# Patient Record
Sex: Female | Born: 1965 | Race: White | Hispanic: No | Marital: Married | Smoking: Never smoker
Health system: Southern US, Community
[De-identification: ages and names within clinical notes are randomized; demographics above are authoritative.]

## PROBLEM LIST (undated history)

## (undated) DIAGNOSIS — I341 Nonrheumatic mitral (valve) prolapse: Secondary | ICD-10-CM

## (undated) DIAGNOSIS — I4589 Other specified conduction disorders: Secondary | ICD-10-CM

## (undated) DIAGNOSIS — K589 Irritable bowel syndrome without diarrhea: Secondary | ICD-10-CM

## (undated) DIAGNOSIS — R109 Unspecified abdominal pain: Secondary | ICD-10-CM

## (undated) DIAGNOSIS — R002 Palpitations: Secondary | ICD-10-CM

## (undated) DIAGNOSIS — I10 Essential (primary) hypertension: Secondary | ICD-10-CM

## (undated) DIAGNOSIS — F419 Anxiety disorder, unspecified: Secondary | ICD-10-CM

## (undated) DIAGNOSIS — E785 Hyperlipidemia, unspecified: Secondary | ICD-10-CM

## (undated) DIAGNOSIS — G4730 Sleep apnea, unspecified: Secondary | ICD-10-CM

## (undated) DIAGNOSIS — R011 Cardiac murmur, unspecified: Secondary | ICD-10-CM

## (undated) DIAGNOSIS — M5090 Cervical disc disorder, unspecified, unspecified cervical region: Secondary | ICD-10-CM

## (undated) DIAGNOSIS — G8929 Other chronic pain: Secondary | ICD-10-CM

## (undated) HISTORY — DX: Essential (primary) hypertension: I10

## (undated) HISTORY — PX: HERNIA REPAIR: SHX51

## (undated) HISTORY — DX: Hyperlipidemia, unspecified: E78.5

## (undated) HISTORY — PX: BREAST CYST ASPIRATION: SHX578

---

## 2004-09-22 ENCOUNTER — Ambulatory Visit: Payer: Self-pay | Admitting: Gastroenterology

## 2005-01-07 ENCOUNTER — Ambulatory Visit: Payer: Self-pay | Admitting: Family Medicine

## 2005-05-24 ENCOUNTER — Ambulatory Visit: Payer: Self-pay | Admitting: Family Medicine

## 2006-02-22 HISTORY — PX: APPENDECTOMY: SHX54

## 2006-09-08 ENCOUNTER — Ambulatory Visit: Payer: Self-pay | Admitting: Gastroenterology

## 2006-10-14 ENCOUNTER — Ambulatory Visit: Payer: Self-pay | Admitting: Family Medicine

## 2007-06-22 ENCOUNTER — Ambulatory Visit: Payer: Self-pay | Admitting: Surgery

## 2007-07-08 ENCOUNTER — Ambulatory Visit: Payer: Self-pay | Admitting: Family Medicine

## 2007-07-09 ENCOUNTER — Ambulatory Visit: Payer: Self-pay | Admitting: Family Medicine

## 2007-08-08 ENCOUNTER — Ambulatory Visit: Payer: Self-pay | Admitting: Surgery

## 2007-08-18 ENCOUNTER — Other Ambulatory Visit: Payer: Self-pay

## 2007-08-18 ENCOUNTER — Ambulatory Visit: Payer: Self-pay | Admitting: Surgery

## 2007-12-13 ENCOUNTER — Ambulatory Visit: Payer: Self-pay | Admitting: Family Medicine

## 2008-07-16 ENCOUNTER — Ambulatory Visit: Payer: Self-pay | Admitting: Neurology

## 2008-08-28 ENCOUNTER — Ambulatory Visit: Payer: Self-pay | Admitting: Neurology

## 2010-04-23 ENCOUNTER — Ambulatory Visit: Payer: Self-pay | Admitting: Family Medicine

## 2010-07-25 ENCOUNTER — Ambulatory Visit: Payer: Self-pay | Admitting: Internal Medicine

## 2011-02-17 ENCOUNTER — Ambulatory Visit: Payer: Self-pay | Admitting: Gastroenterology

## 2011-12-08 ENCOUNTER — Ambulatory Visit: Payer: Self-pay | Admitting: Family Medicine

## 2012-10-30 ENCOUNTER — Ambulatory Visit: Payer: Self-pay | Admitting: Family Medicine

## 2012-10-30 LAB — URINALYSIS, COMPLETE
Bilirubin,UR: NEGATIVE
Glucose,UR: NEGATIVE mg/dL (ref 0–75)
Ketone: NEGATIVE
Leukocyte Esterase: NEGATIVE
Nitrite: NEGATIVE
Ph: 6 (ref 4.5–8.0)

## 2012-11-03 ENCOUNTER — Ambulatory Visit: Payer: Self-pay | Admitting: Family Medicine

## 2012-11-20 ENCOUNTER — Ambulatory Visit: Payer: Self-pay | Admitting: Gastroenterology

## 2012-11-28 ENCOUNTER — Ambulatory Visit: Payer: Self-pay | Admitting: Gastroenterology

## 2012-12-19 ENCOUNTER — Ambulatory Visit: Payer: Self-pay | Admitting: Family Medicine

## 2013-04-02 ENCOUNTER — Observation Stay: Payer: Self-pay | Admitting: Internal Medicine

## 2013-04-02 LAB — COMPREHENSIVE METABOLIC PANEL
ALBUMIN: 3.3 g/dL — AB (ref 3.4–5.0)
ALK PHOS: 116 U/L
AST: 23 U/L (ref 15–37)
Anion Gap: 8 (ref 7–16)
BILIRUBIN TOTAL: 0.6 mg/dL (ref 0.2–1.0)
BUN: 11 mg/dL (ref 7–18)
CHLORIDE: 109 mmol/L — AB (ref 98–107)
CREATININE: 0.78 mg/dL (ref 0.60–1.30)
Calcium, Total: 8.7 mg/dL (ref 8.5–10.1)
Co2: 24 mmol/L (ref 21–32)
EGFR (African American): >60
Glucose: 96 mg/dL (ref 65–99)
OSMOLALITY: 281 (ref 275–301)
Potassium: 3.1 mmol/L — ABNORMAL LOW (ref 3.5–5.1)
SGPT (ALT): 22 U/L (ref 12–78)
Sodium: 141 mmol/L (ref 136–145)
Total Protein: 7.5 g/dL (ref 6.4–8.2)

## 2013-04-02 LAB — MAGNESIUM: Magnesium: 1.7 mg/dL — ABNORMAL LOW

## 2013-04-02 LAB — PREGNANCY, URINE: Pregnancy Test, Urine: NEGATIVE m[IU]/mL

## 2013-04-02 LAB — URINALYSIS, COMPLETE
Bilirubin,UR: NEGATIVE
GLUCOSE, UR: NEGATIVE mg/dL (ref 0–75)
KETONE: NEGATIVE
Leukocyte Esterase: NEGATIVE
Nitrite: NEGATIVE
PH: 5 (ref 4.5–8.0)
RBC,UR: 6 /HPF (ref 0–5)
Specific Gravity: 1.027 (ref 1.003–1.030)
WBC UR: 2 /HPF (ref 0–5)

## 2013-04-02 LAB — TROPONIN I: Troponin-I: <0.02 ng/mL

## 2013-04-02 LAB — CBC
HCT: 38.2 % (ref 35.0–47.0)
HGB: 12.2 g/dL (ref 12.0–16.0)
MCH: 24.4 pg — AB (ref 26.0–34.0)
MCHC: 31.9 g/dL — ABNORMAL LOW (ref 32.0–36.0)
MCV: 77 fL — ABNORMAL LOW (ref 80–100)
Platelet: 233 10*3/uL (ref 150–440)
RBC: 4.99 10*6/uL (ref 3.80–5.20)
RDW: 15.3 % — ABNORMAL HIGH (ref 11.5–14.5)
WBC: 7.3 10*3/uL (ref 3.6–11.0)

## 2013-04-02 LAB — CK TOTAL AND CKMB (NOT AT ARMC)
CK, TOTAL: 123 U/L
CK, Total: 137 U/L
CK, Total: 124 U/L
CK-MB: 1.1 ng/mL (ref 0.5–3.6)
CK-MB: 1.3 ng/mL (ref 0.5–3.6)
CK-MB: 0.7 ng/mL (ref 0.5–3.6)

## 2013-04-02 LAB — TSH: Thyroid Stimulating Horm: 0.747 u[IU]/mL

## 2013-04-03 LAB — LIPID PANEL
CHOLESTEROL: 144 mg/dL (ref 0–200)
HDL Cholesterol: 28 mg/dL — ABNORMAL LOW (ref 40–60)
Ldl Cholesterol, Calc: 67 mg/dL (ref 0–100)
Triglycerides: 246 mg/dL — ABNORMAL HIGH (ref 0–200)
VLDL Cholesterol, Calc: 49 mg/dL — ABNORMAL HIGH (ref 5–40)

## 2013-04-03 LAB — POTASSIUM: Potassium: 3.4 mmol/L — ABNORMAL LOW (ref 3.5–5.1)

## 2013-04-03 LAB — MAGNESIUM: Magnesium: 1.7 mg/dL — ABNORMAL LOW

## 2013-06-16 ENCOUNTER — Ambulatory Visit: Payer: Self-pay | Admitting: Family Medicine

## 2013-06-24 ENCOUNTER — Ambulatory Visit: Payer: Self-pay | Admitting: Family Medicine

## 2013-06-24 LAB — CBC WITH DIFFERENTIAL/PLATELET
BASOS ABS: 0 10*3/uL (ref 0.0–0.1)
BASOS PCT: 0.7 %
EOS PCT: 1.9 %
Eosinophil #: 0.1 10*3/uL (ref 0.0–0.7)
HCT: 34.2 % — AB (ref 35.0–47.0)
HGB: 11.2 g/dL — AB (ref 12.0–16.0)
Lymphocyte #: 1.4 10*3/uL (ref 1.0–3.6)
Lymphocyte %: 20.8 %
MCH: 25.1 pg — AB (ref 26.0–34.0)
MCHC: 32.7 g/dL (ref 32.0–36.0)
MCV: 77 fL — ABNORMAL LOW (ref 80–100)
MONOS PCT: 6.2 %
Monocyte #: 0.4 x10 3/mm (ref 0.2–0.9)
NEUTROS ABS: 4.6 10*3/uL (ref 1.4–6.5)
Neutrophil %: 70.4 %
PLATELETS: 231 10*3/uL (ref 150–440)
RBC: 4.45 10*6/uL (ref 3.80–5.20)
RDW: 14.7 % — AB (ref 11.5–14.5)
WBC: 6.5 10*3/uL (ref 3.6–11.0)

## 2013-06-24 LAB — BASIC METABOLIC PANEL
ANION GAP: 12 (ref 7–16)
BUN: 8 mg/dL (ref 7–18)
CALCIUM: 8.7 mg/dL (ref 8.5–10.1)
CO2: 25 mmol/L (ref 21–32)
Chloride: 104 mmol/L (ref 98–107)
Creatinine: 0.88 mg/dL (ref 0.60–1.30)
Glucose: 103 mg/dL — ABNORMAL HIGH (ref 65–99)
OSMOLALITY: 280 (ref 275–301)
Potassium: 3.4 mmol/L — ABNORMAL LOW (ref 3.5–5.1)
SODIUM: 141 mmol/L (ref 136–145)

## 2013-06-24 LAB — D-DIMER(ARMC): D-DIMER: 376 ng/mL

## 2013-07-05 ENCOUNTER — Emergency Department: Payer: Self-pay | Admitting: Emergency Medicine

## 2013-07-05 LAB — BASIC METABOLIC PANEL
Anion Gap: 4 — ABNORMAL LOW (ref 7–16)
BUN: 8 mg/dL (ref 7–18)
Calcium, Total: 8.8 mg/dL (ref 8.5–10.1)
Chloride: 108 mmol/L — ABNORMAL HIGH (ref 98–107)
Co2: 27 mmol/L (ref 21–32)
Creatinine: 0.87 mg/dL (ref 0.60–1.30)
EGFR (African American): >60
EGFR (Non-African Amer.): >60
Glucose: 101 mg/dL — ABNORMAL HIGH (ref 65–99)
Osmolality: 276 (ref 275–301)
Potassium: 4.4 mmol/L (ref 3.5–5.1)
Sodium: 139 mmol/L (ref 136–145)

## 2013-07-05 LAB — CBC WITH DIFFERENTIAL/PLATELET
BASOS PCT: 0.8 %
Basophil #: 0.1 10*3/uL (ref 0.0–0.1)
EOS ABS: 0.1 10*3/uL (ref 0.0–0.7)
EOS PCT: 0.9 %
HCT: 37.3 % (ref 35.0–47.0)
HGB: 11.6 g/dL — ABNORMAL LOW (ref 12.0–16.0)
LYMPHS PCT: 17.6 %
Lymphocyte #: 1.4 10*3/uL (ref 1.0–3.6)
MCH: 23.9 pg — ABNORMAL LOW (ref 26.0–34.0)
MCHC: 31.1 g/dL — AB (ref 32.0–36.0)
MCV: 77 fL — ABNORMAL LOW (ref 80–100)
Monocyte #: 0.4 x10 3/mm (ref 0.2–0.9)
Monocyte %: 5.5 %
NEUTROS PCT: 75.2 %
Neutrophil #: 6 10*3/uL (ref 1.4–6.5)
Platelet: 254 10*3/uL (ref 150–440)
RBC: 4.85 10*6/uL (ref 3.80–5.20)
RDW: 15 % — ABNORMAL HIGH (ref 11.5–14.5)
WBC: 8 10*3/uL (ref 3.6–11.0)

## 2013-07-05 LAB — TROPONIN I

## 2013-07-28 ENCOUNTER — Ambulatory Visit: Payer: Self-pay | Admitting: Emergency Medicine

## 2013-07-28 LAB — RAPID STREP-A WITH REFLX: Micro Text Report: NEGATIVE

## 2013-07-31 LAB — BETA STREP CULTURE(ARMC)

## 2013-08-01 ENCOUNTER — Ambulatory Visit: Payer: Self-pay | Admitting: Family Medicine

## 2013-11-19 ENCOUNTER — Ambulatory Visit: Payer: Self-pay

## 2013-11-19 LAB — CBC WITH DIFFERENTIAL/PLATELET
BASOS ABS: 0 10*3/uL (ref 0.0–0.1)
Basophil %: 0.4 %
EOS PCT: 0.5 %
Eosinophil #: 0 10*3/uL (ref 0.0–0.7)
HCT: 33.6 % — ABNORMAL LOW (ref 35.0–47.0)
HGB: 10.7 g/dL — ABNORMAL LOW (ref 12.0–16.0)
LYMPHS ABS: 1.3 10*3/uL (ref 1.0–3.6)
Lymphocyte %: 18.2 %
MCH: 24.5 pg — ABNORMAL LOW (ref 26.0–34.0)
MCHC: 32 g/dL (ref 32.0–36.0)
MCV: 77 fL — AB (ref 80–100)
Monocyte #: 0.3 x10 3/mm (ref 0.2–0.9)
Monocyte %: 4.6 %
Neutrophil #: 5.3 10*3/uL (ref 1.4–6.5)
Neutrophil %: 76.3 %
Platelet: 252 10*3/uL (ref 150–440)
RBC: 4.38 10*6/uL (ref 3.80–5.20)
RDW: 15.9 % — ABNORMAL HIGH (ref 11.5–14.5)
WBC: 7 10*3/uL (ref 3.6–11.0)

## 2013-11-19 LAB — URINALYSIS, COMPLETE
Bilirubin,UR: NEGATIVE
Glucose,UR: NEGATIVE
Ketone: NEGATIVE
Leukocyte Esterase: NEGATIVE
Nitrite: NEGATIVE
PH: 5.5 (ref 5.0–8.0)
Protein: NEGATIVE
Specific Gravity: 1.005 (ref 1.000–1.030)

## 2013-11-19 LAB — COMPREHENSIVE METABOLIC PANEL
Albumin: 3.4 g/dL (ref 3.4–5.0)
Alkaline Phosphatase: 110 U/L
Anion Gap: 7 (ref 7–16)
BILIRUBIN TOTAL: 0.5 mg/dL (ref 0.2–1.0)
BUN: 7 mg/dL (ref 7–18)
CHLORIDE: 103 mmol/L (ref 98–107)
CREATININE: 0.89 mg/dL (ref 0.60–1.30)
Calcium, Total: 9.2 mg/dL (ref 8.5–10.1)
Co2: 29 mmol/L (ref 21–32)
EGFR (African American): >60
GLUCOSE: 126 mg/dL — AB (ref 65–99)
OSMOLALITY: 277 (ref 275–301)
Potassium: 3.6 mmol/L (ref 3.5–5.1)
SGOT(AST): 17 U/L (ref 15–37)
SGPT (ALT): 21 U/L
SODIUM: 139 mmol/L (ref 136–145)
TOTAL PROTEIN: 7.2 g/dL (ref 6.4–8.2)

## 2013-11-19 LAB — AMYLASE: Amylase: 23 U/L — ABNORMAL LOW (ref 25–115)

## 2013-11-19 LAB — LIPASE, BLOOD: Lipase: 120 U/L (ref 73–393)

## 2013-11-20 ENCOUNTER — Ambulatory Visit: Payer: Self-pay

## 2013-11-21 ENCOUNTER — Ambulatory Visit: Payer: Self-pay

## 2013-11-21 LAB — URINE CULTURE

## 2013-11-23 LAB — CA 125: CA 125: 15 U/mL (ref 0.0–34.0)

## 2014-02-06 ENCOUNTER — Ambulatory Visit: Payer: Self-pay | Admitting: Family Medicine

## 2014-02-06 LAB — URINALYSIS, COMPLETE
BACTERIA: NEGATIVE
Bilirubin,UR: NEGATIVE
Glucose,UR: NEGATIVE
Ketone: NEGATIVE
Leukocyte Esterase: NEGATIVE
Nitrite: NEGATIVE
PROTEIN: NEGATIVE
Ph: 5.5 (ref 5.0–8.0)
SQUAMOUS EPITHELIAL: NONE SEEN
Specific Gravity: 1.025 (ref 1.000–1.030)

## 2014-02-06 LAB — PREGNANCY, URINE: PREGNANCY TEST, URINE: NEGATIVE m[IU]/mL

## 2014-02-07 LAB — URINE CULTURE

## 2014-03-07 ENCOUNTER — Ambulatory Visit: Payer: Self-pay

## 2014-03-07 ENCOUNTER — Ambulatory Visit: Payer: Self-pay | Admitting: Family Medicine

## 2014-03-07 LAB — URINALYSIS, COMPLETE
Bacteria: NEGATIVE
Bilirubin,UR: NEGATIVE
Glucose,UR: NEGATIVE
Ketone: NEGATIVE
Leukocyte Esterase: NEGATIVE
Nitrite: NEGATIVE
PH: 7 (ref 5.0–8.0)
SPECIFIC GRAVITY: 1.025 (ref 1.000–1.030)

## 2014-03-07 LAB — PREGNANCY, URINE: Pregnancy Test, Urine: NEGATIVE m[IU]/mL

## 2014-03-09 ENCOUNTER — Ambulatory Visit: Payer: Self-pay | Admitting: Family Medicine

## 2014-03-09 ENCOUNTER — Emergency Department: Payer: Self-pay | Admitting: Emergency Medicine

## 2014-03-09 LAB — COMPREHENSIVE METABOLIC PANEL
ALK PHOS: 95 U/L
ALT: 18 U/L
ANION GAP: 8 (ref 7–16)
Albumin: 3.3 g/dL — ABNORMAL LOW (ref 3.4–5.0)
BUN: 10 mg/dL (ref 7–18)
Bilirubin,Total: 0.4 mg/dL (ref 0.2–1.0)
CO2: 25 mmol/L (ref 21–32)
Calcium, Total: 8.8 mg/dL (ref 8.5–10.1)
Chloride: 108 mmol/L — ABNORMAL HIGH (ref 98–107)
Creatinine: 0.71 mg/dL (ref 0.60–1.30)
EGFR (African American): >60
EGFR (Non-African Amer.): >60
Glucose: 106 mg/dL — ABNORMAL HIGH (ref 65–99)
Osmolality: 281 (ref 275–301)
Potassium: 4.1 mmol/L (ref 3.5–5.1)
SGOT(AST): 22 U/L (ref 15–37)
SODIUM: 141 mmol/L (ref 136–145)
Total Protein: 7.2 g/dL (ref 6.4–8.2)

## 2014-03-09 LAB — CBC
HCT: 34.5 % — ABNORMAL LOW (ref 35.0–47.0)
HGB: 10.9 g/dL — AB (ref 12.0–16.0)
MCH: 24.2 pg — ABNORMAL LOW (ref 26.0–34.0)
MCHC: 31.5 g/dL — AB (ref 32.0–36.0)
MCV: 77 fL — ABNORMAL LOW (ref 80–100)
Platelet: 251 10*3/uL (ref 150–440)
RBC: 4.48 10*6/uL (ref 3.80–5.20)
RDW: 17 % — ABNORMAL HIGH (ref 11.5–14.5)
WBC: 7.7 10*3/uL (ref 3.6–11.0)

## 2014-03-09 LAB — URINALYSIS, COMPLETE
Bacteria: NEGATIVE
Bilirubin,UR: NEGATIVE
Glucose,UR: NEGATIVE
Ketone: NEGATIVE
Leukocyte Esterase: NEGATIVE
Nitrite: NEGATIVE
PH: 5.5 (ref 5.0–8.0)
PROTEIN: NEGATIVE
Specific Gravity: 1.015 (ref 1.000–1.030)
WBC UR: NONE SEEN /HPF (ref 0–5)

## 2014-03-09 LAB — WET PREP, GENITAL

## 2014-03-09 LAB — LIPASE, BLOOD: LIPASE: 107 U/L (ref 73–393)

## 2014-03-09 LAB — GC/CHLAMYDIA PROBE AMP

## 2014-03-19 ENCOUNTER — Ambulatory Visit: Payer: Self-pay | Admitting: Family Medicine

## 2014-03-19 LAB — CBC WITH DIFFERENTIAL/PLATELET
Basophil #: 0 10*3/uL (ref 0.0–0.1)
Basophil %: 0.6 %
Eosinophil #: 0 10*3/uL (ref 0.0–0.7)
Eosinophil %: 0.7 %
HCT: 32.8 % — ABNORMAL LOW (ref 35.0–47.0)
HGB: 10.4 g/dL — ABNORMAL LOW (ref 12.0–16.0)
LYMPHS ABS: 1.2 10*3/uL (ref 1.0–3.6)
Lymphocyte %: 18.5 %
MCH: 24.7 pg — AB (ref 26.0–34.0)
MCHC: 31.8 g/dL — ABNORMAL LOW (ref 32.0–36.0)
MCV: 78 fL — AB (ref 80–100)
MONO ABS: 0.5 x10 3/mm (ref 0.2–0.9)
Monocyte %: 7.4 %
Neutrophil #: 4.8 10*3/uL (ref 1.4–6.5)
Neutrophil %: 72.8 %
Platelet: 254 10*3/uL (ref 150–440)
RBC: 4.23 10*6/uL (ref 3.80–5.20)
RDW: 16.1 % — AB (ref 11.5–14.5)
WBC: 6.5 10*3/uL (ref 3.6–11.0)

## 2014-03-19 LAB — FECAL OCCULT BLOOD, GUAIAC: Fecal Occult Blood: NEGATIVE

## 2014-03-19 LAB — PREGNANCY, URINE: PREGNANCY TEST, URINE: NEGATIVE m[IU]/mL

## 2014-04-12 ENCOUNTER — Ambulatory Visit: Payer: Self-pay | Admitting: Family Medicine

## 2014-04-12 LAB — HEPATIC FUNCTION PANEL
ALT: 12 U/L (ref 7–35)
AST: 16 U/L (ref 13–35)

## 2014-04-12 LAB — CBC AND DIFFERENTIAL: Hemoglobin: 11.3 g/dL — AB (ref 12.0–16.0)

## 2014-04-12 LAB — TSH: TSH: 0.57 u[IU]/mL (ref <=5.90)

## 2014-04-16 ENCOUNTER — Ambulatory Visit: Payer: Self-pay | Admitting: Family Medicine

## 2014-05-01 ENCOUNTER — Ambulatory Visit: Payer: Self-pay | Admitting: Family Medicine

## 2014-05-01 LAB — HM MAMMOGRAPHY: HM Mammogram: NORMAL

## 2014-05-08 ENCOUNTER — Ambulatory Visit: Payer: Self-pay | Admitting: Family Medicine

## 2014-06-15 NOTE — Discharge Summary (Signed)
PATIENT NAME:  Sherri Matthews, Divine W MR#:  [id number] DATE OF BIRTH:  [birth year]  DATE OF ADMISSION:  04/02/2013 DATE OF DISCHARGE:  04/03/2013  DISCHARGE DIAGNOSIS:  Pleuritic chest pain, likely from recent viral illness, improving with nonsteroidals. This is likely noncardiac with negative cardiac enzymes and Myoview.   SECONDARY DIAGNOSES: History of mitral valve prolapse and hyperlipidemia, followed by Dr. Gwen PoundsKowalski.   CONSULTATIONS: None.   PROCEDURES AND RADIOLOGY: Chest x-ray on 9th of February showed no acute cardiopulmonary disease.   Myoview on 10th of February was negative. EF of 77%.   A 2-D echo on 10th of February showed normal LVEF with an EF of 55% to 60%. Normal global LV systolic function.   MAJOR LABORATORY PANEL: UA on admission showed 1+ bacteria, 2 WBCs, otherwise negative.   HISTORY AND SHORT HOSPITAL COURSE: The patient is a 49 year old female with the no significant medical problems admitted for chest pain, which was thought to be pleuritic in nature, but considering her significant family history and this practice, she was ruled out with negative serial troponins and underwent Myoview, which was also negative; also had a 2-D echocardiogram, which was essentially within normal limits. She was started on ibuprofen. She did have significant headache from nitroglycerin, which was stopped and was discharged home in stable condition on 10th of February. On the date of discharge, her vital signs were as follows: Temperature 98.5, heart rate 77, pulmonary respirations 16 per minute, blood pressure 113/74 mmHg.  She was saturating 97% on room air.   PERTINENT PHYSICAL EXAMINATION ON THE DATE OF DISCHARGE:  CARDIOVASCULAR: S1, S2 normal. No murmurs, rubs or gallop.  LUNGS: Clear to auscultation bilaterally. No wheezing, rales, rhonchi or crepitation.   ABDOMEN: Soft, benign.  NEUROLOGIC: Nonfocal examination. All other physical examination remained at baseline.   DISCHARGE  MEDICATIONS: 1.  Metoprolol 25 mg 1/2 tablet p.o. b.i.d.  2.  Nexium 40 mg 1 to 2 capsules p.o. daily.  3.  Simvastatin 40 mg p.o. daily.  4.  Xanax 0.5 mg 1/2 tablet p.o. 3 times a day.  5.  Xanax 0.5 mg 1/4 tablet p.o. 3 times a day as needed.  6.  Ibuprofen 400 mg p.o. b.i.d. as needed.   DISCHARGE DIET: Low sodium, low fat, low cholesterol.   DISCHARGE ACTIVITY: As tolerated.   DISCHARGE INSTRUCTIONS AND FOLLOWUP:  The patient was instructed to follow up with her primary care physician, Dr. [name], in 1 to 2 weeks. [facility] Dr. Arnoldo HookerBruce Kowalski, in 2 to 4 weeks.   Total times discharging this patient was 45 minutes.     ____________________________ Ellamae SiaVipul S. Sherryll BurgerShah, MD vss:dmm D: 04/03/2013 22:46:22 ET T: 04/03/2013 22:57:29 ET JOB#: [id number]  cc: Dniyah Grant S. Sherryll BurgerShah, MD, <Dictator> Duanne Limerickeanna C. Jones, MD Lamar BlinksBruce J. Kowalski, MD Ellamae SiaVIPUL S [facility] MD ELECTRONICALLY SIGNED 04/07/2013 12:19

## 2014-06-15 NOTE — H&P (Signed)
PATIENT NAME:  Sherri FallingLLEN, [name] DATE OF BIRTH:  1965/09/28  DATE OF ADMISSION:  04/02/2013  PRIMARY CARE PHYSICIAN: [name], MD  CARDIOLOGIST: Arnoldo HookerBruce Kowalski, MD  HISTORY OF PRESENT ILLNESS: The patient is a 49 year old Caucasian female with past medical history significant for history of mitral valve prolapse, history of AV nodal tachycardia who is on beta blockers at home, presents to the hospital with complaints of just not feeling well. According to the patient, approximately a week ago she was sick with upper respiratory infection. She was out from school. She was having fevers, sore throat, sinus congestion, some sneezing, achiness all over her body. She was given by Dr. Yetta BarreJones a Z-Pak after which her condition somewhat improved. This weekend however she started noticing that she was having chest pain. Pain was described in the mid to left lateral side of her sternum, intermittent pain. However, it would become stabbing. It happened Saturday, a few days ago. It would come for a few minutes and just would take her breath away. Now she has intermittent feeling like her chest was hit and she still remains very short of breath. She cannot take deep breaths. She also admits of palpitations despite her taking her usual doses of beta blockers. Because of that, she decided to go to the Emergency Room for further evaluation. She was noted to have mild abnormalities on her EKG with questionable ST depressions in V3 and V4 and hospitalist services were contacted for admission due to chest pains.   PAST MEDICAL HISTORY: Significant for history of large ventral hernia due to appendectomy. Repair was done in June 2009 by Dr. Michela PitcherEly. Status post appendectomy, history of colon polyps, multiple colonoscopies as well as EGDs. Recent EGD was concerning for possible eosinophilic esophagitis, also mitral valve prolapse, hyperlipidemia, and AV nodal tachycardia, being followed by Dr. Gwen PoundsKowalski, also  gastroesophageal reflux disease.   MEDICATIONS: Metoprolol 12.5 mg twice daily, Nexium 40 mg p.o. twice daily, simvastatin 40 mg at bedtime, Xanax 0.5 mg 1/4 tablet 3 times daily as needed.  PAST SURGICAL HISTORY: As above.   ALLERGIES: PERCOCET.   FAMILY HISTORY: The patient's mother had diabetes. She also had mild MI at the age of 51.  Father had diabetes as well as A-fib. Maternal grandmother died at the age of 51 of MI.   SOCIAL HISTORY: The patient is married and has 3 children; 3125, 3315, and 51 years old. Also has 51-year-old grandson. No smoking ever. Also no alcohol abuse. She is a Runner, broadcasting/film/videoteacher.  REVIEW OF SYSTEMS: Positive for fatigue and weakness, pains in the chest, shortness of breath, and chest pains with breathing, painful respirations. Also arrhythmias, palpitations, feeling presyncopal, and having intermittent diarrhea.  CONSTITUTIONAL: Denies any fevers, chills, weight loss or gain.  EYES: Denies blurry vision, double vision, glaucoma or cataracts.  ENT: Denies any tinnitus, allergies, epistaxis, sinus pain, dentures, or difficulty swallowing.  RESPIRATORY: Denies cough, wheezing, or COPD. CARDIOVASCULAR: Denies any orthopnea, edema.   GASTROINTESTINAL: Denies nausea, vomiting, diarrhea, or constipation.  GENITOURINARY: Denies dysuria, hematuria, frequency, incontinence.  ENDOCRINE: Denies any polydipsia, nocturia, thyroid problems, increased sweating, and no excessive thirst. MUSCULOSKELETAL: Denies arthritis, cramps, swelling. Admits of having some discomfort in her right calf since Saturday, but no swelling in that area. No numbness, epilepsy, or tremor. PSYCHIATRIC: Denies anxiety, insomnia, or depression.   PHYSICAL EXAMINATION: VITAL SIGNS: On arrival to the hospital, the patient's temperature was 98.6, pulse was 67, respiration was 18, blood pressure 131/81, and saturation  was 100% on room air.  GENERAL: This is a well-developed, well-nourished Caucasian female in no  significant distress, lying on the stretcher.  HEENT: Her pupils are equal and reactive to light. Extraocular movements are intact. No icterus or conjunctivitis. Has normal hearing. No pharyngeal erythema. Mucosa is moist.  NECK: No masses. Supple and nontender. Thyroid is not enlarged. No adenopathy. No JVD or carotid bruits bilaterally. Full range of motion.  LUNGS: Clear to auscultation in all fields. No rales or rhonchi. Mildly diminished breath sounds were noted, however, no wheezing, no labored inspirations, increased effort, dullness to percussion or overt respiratory distress.  CARDIOVASCULAR: S1 and S2 appreciated. No murmurs, gallops, or rubs were noted. Rhythm was regular. PMI not lateralized. Chest is nontender to palpation.  EXTREMITIES: 1+ pedal pulses. No lower extremity edema, calf tenderness, or cyanosis was noted.  ABDOMEN: Soft, nontender. Bowel sounds are present. No hepatosplenomegaly or masses were noted.  RECTAL: Deferred.  MUSCULOSKELETAL: Muscle strength: able to move all extremities. No cyanosis, degenerative joint disease or kyphosis. Gait was not tested.  SKIN: Did not reveal any rashes, lesions, erythema, nodularity or induration. It was warm and dry to palpation.  LYMPH: No adenopathy in cervical region.  NEUROLOGIC: Cranial nerves grossly intact. Sensory is intact. No dysarthria or aphasia.  PSYCH: The patient is alert and oriented to time, person and place, cooperative. Memory is good. No significant confusion, agitation, or depression noted.   DIAGNOSTIC DATA: EKG: The first one was done at around 10:00 a.m. and showed sinus brady at 58 beats per minute, normal axis, nonspecific ST-T changes in V3 and V4, some mild ST depressions. Repeat EKG done at around 1:00 p.m. revealed normal sinus rhythm at 60 beats per minute, normal axis, nonspecific ST-T changes remained in the same, V3 and V4.   Radiologic studies: Chest x-ray, PA and lateral, 9th of February 2015, revealed  no active cardiopulmonary disease.   Laboratory data: BMP within normal limits, except for potassium level was low at 3.1 and albumin level of 3.3, otherwise unremarkable. Cardiac enzymes x2 were within normal limits. White blood cell count was normal at 7.3, hemoglobin was 12.2, platelet count was 233, and MCV low at 77. D-dimer was normal at less than 0.22. Urinalysis revealed yellow clear urine, negative for glucose, bilirubin, or ketones, specific gravity 1.027, pH was 5.0, 2+ blood, 30 mg/dL protein, negative for nitrites or leukocyte esterase, 6 red blood cells, 2 white blood cells, 1+ bacteria, 2 epithelial cells, mucus was present as well as 1 hyaline cast. Pregnancy test was negative.   ASSESSMENT AND PLAN: 1.  Pleuritic chest pain of unclear etiology at this time. Admit the patient to the medical floor, ambulate her and follow her oxygenation. We will get CT scan if oxygenation seems to be worsening with ambulation. We will get also Myoview stress test in the morning.  2.  History of AV nodal tachycardia. Continue low dose metoprolol.  3.  Hypokalemia. Supplement p.o. Get magnesium level.  4.  Gastroesophageal reflux disease. Continue proton pump inhibitor. 5.  Hyperlipidemia. Get lipid panel in the morning.   TIME SPENT: 50 minutes. ____________________________ Katharina Caper, MD rv:sb D: 04/02/2013 15:07:08 ET T: 04/02/2013 16:30:10 ET JOB#: [id number]  cc: Katharina Caper, MD, <Dictator> Duanne Limerick, MD Clif Serio MD ELECTRONICALLY SIGNED 04/25/2013 20:29

## 2014-07-08 LAB — LIPID PANEL
Cholesterol: 173 mg/dL (ref 0–200)
HDL: 48 mg/dL (ref 35–70)
LDL Cholesterol: 92 mg/dL
TRIGLYCERIDES: 166 mg/dL — AB (ref 40–160)

## 2014-07-08 LAB — BASIC METABOLIC PANEL
BUN: 10 mg/dL (ref 4–21)
Creatinine: 0.6 mg/dL (ref <=1.1)
Glucose: 78 mg/dL
Potassium: 4.2 mmol/L (ref 3.4–5.3)
SODIUM: 141 mmol/L (ref 137–147)

## 2014-07-16 DIAGNOSIS — K219 Gastro-esophageal reflux disease without esophagitis: Secondary | ICD-10-CM | POA: Insufficient documentation

## 2014-07-16 DIAGNOSIS — Z0000 Encounter for general adult medical examination without abnormal findings: Secondary | ICD-10-CM | POA: Insufficient documentation

## 2014-07-16 DIAGNOSIS — F064 Anxiety disorder due to known physiological condition: Secondary | ICD-10-CM | POA: Insufficient documentation

## 2014-07-16 DIAGNOSIS — R011 Cardiac murmur, unspecified: Secondary | ICD-10-CM | POA: Insufficient documentation

## 2014-07-16 DIAGNOSIS — I10 Essential (primary) hypertension: Secondary | ICD-10-CM | POA: Insufficient documentation

## 2014-07-16 DIAGNOSIS — Z8739 Personal history of other diseases of the musculoskeletal system and connective tissue: Secondary | ICD-10-CM | POA: Insufficient documentation

## 2014-07-16 DIAGNOSIS — E7849 Other hyperlipidemia: Secondary | ICD-10-CM | POA: Insufficient documentation

## 2014-07-16 DIAGNOSIS — M5137 Other intervertebral disc degeneration, lumbosacral region: Secondary | ICD-10-CM | POA: Insufficient documentation

## 2014-10-08 ENCOUNTER — Other Ambulatory Visit: Payer: Self-pay

## 2014-10-08 DIAGNOSIS — Z8349 Family history of other endocrine, nutritional and metabolic diseases: Secondary | ICD-10-CM

## 2014-10-09 ENCOUNTER — Ambulatory Visit: Payer: Self-pay | Admitting: Family Medicine

## 2014-12-26 ENCOUNTER — Ambulatory Visit (INDEPENDENT_AMBULATORY_CARE_PROVIDER_SITE_OTHER): Payer: BC Managed Care – PPO | Admitting: Family Medicine

## 2014-12-26 ENCOUNTER — Encounter: Payer: Self-pay | Admitting: Family Medicine

## 2014-12-26 VITALS — BP 110/80 | HR 78 | Ht 70.0 in | Wt 224.0 lb

## 2014-12-26 DIAGNOSIS — K589 Irritable bowel syndrome without diarrhea: Secondary | ICD-10-CM | POA: Diagnosis not present

## 2014-12-26 MED ORDER — DICYCLOMINE HCL 10 MG PO CAPS
10.0000 mg | ORAL_CAPSULE | Freq: Three times a day (TID) | ORAL | Status: DC
Start: 2014-12-26 — End: 2015-04-07

## 2014-12-26 NOTE — Progress Notes (Signed)
Name: Sherri Matthews   MRN: [id number]    DOB: 08/15/1965   Date:12/27/2014       Progress Note  Subjective  Chief Complaint  Chief Complaint  Patient presents with  . Abdominal Pain    hurting in mid abdomen/ under ribs- "feels bloated"- taking Nexium otc 2 qday.  . Sore Throat    throat is red and burning    Abdominal Pain This is a new problem. The current episode started 1 to 4 weeks ago. The onset quality is gradual. The problem occurs daily. Duration: on and off allthe time. The pain is located in the epigastric region and generalized abdominal region. The pain is at a severity of 5/10. The pain is mild. The quality of the pain is aching. Associated symptoms include nausea. Pertinent negatives include no constipation, diarrhea, dysuria, fever, frequency, headaches, hematochezia, hematuria, melena, myalgias, vomiting or weight loss. She has tried acetaminophen and proton pump inhibitors for the symptoms. The treatment provided no relief. Prior diagnostic workup includes ultrasound, lower endoscopy and CT scan. Her past medical history is significant for abdominal surgery. appendectomy 2009/ umbillical hernia 2010  Sore Throat  Associated symptoms include abdominal pain. Pertinent negatives include no coughing, diarrhea, ear discharge, ear pain, headaches, neck pain, shortness of breath or vomiting.    No problem-specific assessment & plan notes found for this encounter.   No past medical history on file.  Past Surgical History  Procedure Laterality Date  . Hernia repair    . Appendectomy      No family history on file.  Social History   Social History  . Marital Status: Married    Spouse Name: N/A  . Number of Children: N/A  . Years of Education: N/A   Occupational History  . Not on file.   Social History Main Topics  . Smoking status: Never Smoker   . Smokeless tobacco: Not on file  . Alcohol Use: 0.0 oz/week    0 Standard drinks or equivalent per week  .  Drug Use: No  . Sexual Activity: Not on file   Other Topics Concern  . Not on file   Social History Narrative    Allergies  Allergen Reactions  . Oxycodone-Acetaminophen Itching  . Fenofibrate     myalgia  . Sertraline Other (See Comments)    Pt prefers to never be on this again.       Review of Systems  Constitutional: Negative for fever, chills, weight loss and malaise/fatigue.  HENT: Negative for ear discharge, ear pain and sore throat.   Eyes: Negative for blurred vision.  Respiratory: Negative for cough, sputum production, shortness of breath and wheezing.   Cardiovascular: Negative for chest pain, palpitations and leg swelling.  Gastrointestinal: Positive for nausea and abdominal pain. Negative for heartburn, vomiting, diarrhea, constipation, blood in stool, melena and hematochezia.  Genitourinary: Negative for dysuria, urgency, frequency and hematuria.  Musculoskeletal: Negative for myalgias, back pain, joint pain and neck pain.  Skin: Negative for rash.  Neurological: Negative for dizziness, tingling, sensory change, focal weakness and headaches.  Endo/Heme/Allergies: Negative for environmental allergies and polydipsia. Does not bruise/bleed easily.  Psychiatric/Behavioral: Negative for depression and suicidal ideas. The patient is not nervous/anxious and does not have insomnia.      Objective  Filed Vitals:   12/26/14 1513  BP: 110/80  Pulse: 78  Height: 5\' 10"  (1.778 m)  Weight: 224 lb (101.606 kg)    Physical Exam  Constitutional: She is well-developed, well-nourished, and  in no distress. No distress.  HENT:  Head: Normocephalic and atraumatic.  Right Ear: External ear normal.  Left Ear: External ear normal.  Nose: Nose normal.  Mouth/Throat: Oropharynx is clear and moist.  Eyes: Conjunctivae and EOM are normal. Pupils are equal, round, and reactive to light. Right eye exhibits no discharge. Left eye exhibits no discharge.  Neck: Normal range of  motion. Neck supple. No JVD present. No thyromegaly present.  Cardiovascular: Normal rate, regular rhythm, normal heart sounds and intact distal pulses.  Exam reveals no gallop and no friction rub.   No murmur heard. Pulmonary/Chest: Effort normal and breath sounds normal.  Abdominal: Soft. Bowel sounds are normal. She exhibits no distension, no ascites and no mass. There is no splenomegaly or hepatomegaly. There is tenderness. There is no rigidity, no rebound, no guarding and no CVA tenderness.  Musculoskeletal: Normal range of motion. She exhibits no edema.  Lymphadenopathy:    She has no cervical adenopathy.  Neurological: She is alert. She has normal reflexes.  Skin: Skin is warm and dry. She is not diaphoretic.  Psychiatric: Mood and affect normal.  Nursing note and vitals reviewed.     Assessment & Plan  Problem List Items Addressed This Visit    None    Visit Diagnoses    IBS (irritable bowel syndrome)    -  Primary    trial of bentyl    Relevant Medications    dicyclomine (BENTYL) 10 MG capsule    Other Relevant Orders    Hepatic function panel (Completed)    Amylase (Completed)         Dr. Hayden [facility]  12/27/2014

## 2014-12-27 LAB — HEPATIC FUNCTION PANEL
ALBUMIN: 4 g/dL (ref 3.5–5.5)
ALK PHOS: 85 IU/L (ref 39–117)
ALT: 17 IU/L (ref 0–32)
AST: 17 IU/L (ref 0–40)
Bilirubin Total: 0.4 mg/dL (ref 0.0–1.2)
Bilirubin, Direct: 0.09 mg/dL (ref 0.00–0.40)
Total Protein: 6.7 g/dL (ref 6.0–8.5)

## 2014-12-27 LAB — AMYLASE: Amylase: 24 U/L — ABNORMAL LOW (ref 31–124)

## 2015-01-15 ENCOUNTER — Other Ambulatory Visit: Payer: Self-pay | Admitting: Family Medicine

## 2015-01-27 ENCOUNTER — Ambulatory Visit
Admission: EM | Admit: 2015-01-27 | Discharge: 2015-01-27 | Disposition: A | Discharge status: Home / Self Care | Payer: BC Managed Care – PPO | Attending: Family Medicine | Admitting: Family Medicine

## 2015-01-27 ENCOUNTER — Encounter: Payer: Self-pay | Admitting: Emergency Medicine

## 2015-01-27 ENCOUNTER — Ambulatory Visit (INDEPENDENT_AMBULATORY_CARE_PROVIDER_SITE_OTHER)
Admission: RE | Admit: 2015-01-27 | Discharge: 2015-01-27 | Disposition: A | Discharge status: Home / Self Care | Payer: BC Managed Care – PPO | Source: Ambulatory Visit | Attending: Family Medicine | Admitting: Family Medicine

## 2015-01-27 DIAGNOSIS — M79604 Pain in right leg: Secondary | ICD-10-CM

## 2015-01-27 MED ORDER — MELOXICAM 15 MG PO TABS: 15.0000 mg | ORAL_TABLET | Freq: Every day | ORAL | Status: DC | PRN

## 2015-01-27 NOTE — ED Provider Notes (Signed)
[facility]  ____________________________________________  Time seen: Approximately 8:53 AM  I have reviewed the triage vital signs and the nursing notes.   HISTORY  Chief Complaint Leg Pain   HPI Sherri Matthews is a 49 y.o. female presentsfor the complaints of right posterior knee pain. Patient reports right posterior knee has been hurting intermittently for the last 3 weeks. Denies fall, trauma, increase activity, recent immobilization, recent surgery or other known trigger. Patient reports that the pain is not every day but can vary. Patient reports that she does work at an [employer] as a Runner, broadcasting/film/videoteacher and frequently standing for long periods of time. Denies swelling in lower extremities.  States current pain is 3 out of 10 and reports that most pain can be up to 6 out of 10. Patient states that pain is worse at night when she lies down and she has a throbbing pain in the back of her knee. Denies anterior knee pain. Denies other pain and right lower extremity.  Denies chest pain, shortness of breath, abdominal pain, weakness or other changes.   History reviewed. No pertinent past medical history.  Patient Active Problem List   Diagnosis Date Noted  . 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 Routine general medical examination at a health care facility 07/16/2014  . 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 Essential (primary) hypertension 07/16/2014  . Heart murmur 07/16/2014  . Gastroesophageal reflux disease 07/16/2014  . H/O: osteoarthritis 07/16/2014    Past Surgical History  Procedure Laterality Date  . Hernia repair    . Appendectomy      Current Outpatient Rx  Name  Route  Sig  Dispense  Refill  . ALPRAZolam (XANAX) 0.5 MG tablet   Oral   Take 1 tablet by mouth daily.         Marland Kitchen. dicyclomine (BENTYL) 10 MG capsule   Oral   Take 1  capsule (10 mg total) by mouth 4 (four) times daily -  before meals and at bedtime.   60 capsule   1   . esomeprazole (NEXIUM) 40 MG capsule   Oral   Take 1 capsule by mouth daily. Dr Servando SnareWohl         . metoprolol tartrate (LOPRESSOR) 25 MG tablet   Oral   Take 0.5 tablets by mouth 2 (two) times daily.         . simvastatin (ZOCOR) 40 MG tablet   Oral   Take 1 tablet by mouth daily.           Allergies Oxycodone-acetaminophen; Fenofibrate; and Sertraline  History reviewed. No pertinent family history.  Social History Social History  Substance Use Topics  . Smoking status: Never Smoker   . Smokeless tobacco: None  . Alcohol Use: 0.0 oz/week    0 Standard drinks or equivalent per week    Review of Systems Constitutional: No fever/chills Eyes: No visual changes. ENT: No sore throat. Cardiovascular: Denies chest pain. Respiratory: Denies shortness of breath. Gastrointestinal: No abdominal pain.  No nausea, no vomiting.  No diarrhea.  No constipation. Genitourinary: Negative for dysuria. Musculoskeletal: Negative for back pain. Right posterior knee pain. Skin: Negative for rash. Neurological: Negative for headaches, focal weakness or numbness.  10-point ROS otherwise negative.  ____________________________________________   PHYSICAL EXAM:  VITAL SIGNS: ED Triage Vitals  Enc Vitals Group     BP 01/27/15 0828 129/74 mmHg     Pulse Rate  01/27/15 0828 65     Resp 01/27/15 0828 16     Temp 01/27/15 0828 98 F (36.7 C)     Temp Source 01/27/15 0828 Tympanic     SpO2 01/27/15 0828 100 %     Weight 01/27/15 0828 215 lb (97.523 kg)     Height 01/27/15 0828  (1.803 m)     Head Cir --      Peak Flow --      Pain Score 01/27/15 0831 6     Pain Loc --      Pain Edu? --      Excl. in GC? --     Constitutional: Alert and oriented. Well appearing and in no acute distress. Eyes: Conjunctivae are normal. PERRL. EOMI. Head: Atraumatic.  Nose: No  congestion/rhinnorhea.  Mouth/Throat: Mucous membranes are moist.  Oropharynx non-erythematous. Neck: No stridor.  No cervical spine tenderness to palpation. Hematological/Lymphatic/Immunilogical: No cervical lymphadenopathy. Cardiovascular: Normal rate, regular rhythm. Grossly normal heart sounds.  Good peripheral circulation. Respiratory: Normal respiratory effort.  No retractions. Lungs CTAB. No wheezes, rales or rhonchi. Good air movement. Gastrointestinal: Soft and nontender. No distention. Normal Bowel sounds.   Musculoskeletal: No lower or upper extremity tenderness nor edema.  No joint effusions. Bilateral pedal pulses equal and easily palpated. No cervical, thoracic or lumbar tenderness to palpation Except: Posterior mid right knee minimal TTP, no swelling, no erythema, no ecchymosis, Full ROM. No anterior knee pain. No pain with anterior or posterior drawer test, no pain with medial or lateral stress test. Bilateral pulses equal and easily palpated.  Neurologic:  Normal speech and language. No gross focal neurologic deficits are appreciated. No gait instability. Skin:  Skin is warm, dry and intact. No rash noted. Psychiatric: Mood and affect are normal. Speech and behavior are normal.  ____________________________________________   LABS (all labs ordered are listed, but only abnormal results are displayed)  Labs Reviewed - No data to display  RADIOLOGY  EXAM: RIGHT LOWER EXTREMITY VENOUS DUPLEX ULTRASOUND  TECHNIQUE: Gray-scale sonography with graded compression, as well as color Doppler and duplex ultrasound were performed to evaluate the right lower extremity deep venous system from the level of the common femoral vein and including the common femoral, femoral, profunda femoral, popliteal and calf veins including the posterior tibial, peroneal and gastrocnemius veins when visible. The superficial great saphenous vein was also interrogated. Spectral Doppler was utilized to  evaluate flow at rest and with distal augmentation maneuvers in the common femoral, femoral and popliteal veins.  COMPARISON: None.  FINDINGS: Contralateral Common Femoral Vein: Respiratory phasicity is normal and symmetric with the symptomatic side. No evidence of thrombus. Normal compressibility.  Common Femoral Vein: No evidence of thrombus. Normal compressibility, respiratory phasicity and response to augmentation.  Saphenofemoral Junction: No evidence of thrombus. Normal compressibility and flow on color Doppler imaging.  Profunda Femoral Vein: No evidence of thrombus. Normal compressibility and flow on color Doppler imaging.  Femoral Vein: No evidence of thrombus. Normal compressibility, respiratory phasicity and response to augmentation.  Popliteal Vein: No evidence of thrombus. Normal compressibility, respiratory phasicity and response to augmentation.  Calf Veins: No evidence of thrombus. Normal compressibility and flow on color Doppler imaging.  Superficial Great Saphenous Vein: No evidence of thrombus. Normal compressibility and flow on color Doppler imaging.  Venous Reflux: None.  Other Findings: None.  IMPRESSION: No evidence of right lower extremity deep venous thrombosis. Left common femoral vein also patent.   Electronically Signed By: Bretta Bang III M.D.  On: 01/27/2015 10:01     INITIAL IMPRESSION / ASSESSMENT AND PLAN / ED COURSE  Pertinent labs & imaging results that were available during my care of the patient were reviewed by me and considered in my medical decision making (see chart for details).  Very well-appearing patient. No acute distress. Presents for the complaints of right posterior knee pain intermittently 3 weeks. Denies known trigger. Denies fall or trauma. Reports continues ambulate well. Minimal tenderness to posterior mid knee, no other tenderness noted. No swelling,  No erythema and fluctuance and no induration.  Suspect strain injury versus Baker's cyst. Patient denies need for x-ray and states that she does not want x-rays performed at this time. However patient states that she wants to make sure she does not have a blood clot. Will evaluate right lower extremity  by venous ultrasound.  Venous ultrasound of right lower extremity per radiology:  NO evidence of right lower extremity deep venous thrombosis. Left common femoral vein also patent.  Will treat patient supportively with daily Mobic. Patient denies need for crutches or bracing. Encouraged avoidance of overly strenuous activity, stretching, support socks/stockings as needed. Patient to follow-up with primary care physician Dr. Yetta Barre this week as needed for continued pain.Discussed follow up with Primary care physician this week. Discussed follow up and return parameters including no resolution or any worsening concerns. Patient verbalized understanding and agreed to plan.   ____________________________________________   FINAL CLINICAL IMPRESSION(S) / ED DIAGNOSES  Final diagnoses:  Right leg pain       Renford Dills, NP 01/27/15 1048

## 2015-01-27 NOTE — Discharge Instructions (Signed)
Take medication as prescribed. Rest. Elevate. Use compression socks and stockings as discussed. Avoid overly strenuous activity.  Follow-up with your primary care physician this week as needed. Return to urgent care as needed for new or worsening concerns.  Musculoskeletal Pain Musculoskeletal pain is muscle and boney aches and pains. These pains can occur in any part of the body. Your caregiver may treat you without knowing the cause of the pain. They may treat you if blood or urine tests, X-rays, and other tests were normal.  CAUSES There is often not a definite cause or reason for these pains. These pains may be caused by a type of germ (virus). The discomfort may also come from overuse. Overuse includes working out too hard when your body is not fit. Boney aches also come from weather changes. Bone is sensitive to atmospheric pressure changes. HOME CARE INSTRUCTIONS   Ask when your test results will be ready. Make sure you get your test results.  Only take over-the-counter or prescription medicines for pain, discomfort, or fever as directed by your caregiver. If you were given medications for your condition, do not drive, operate machinery or power tools, or sign legal documents for 24 hours. Do not drink alcohol. Do not take sleeping pills or other medications that may interfere with treatment.  Continue all activities unless the activities cause more pain. When the pain lessens, slowly resume normal activities. Gradually increase the intensity and duration of the activities or exercise.  During periods of severe pain, bed rest may be helpful. Lay or sit in any position that is comfortable.  Putting ice on the injured area.  Put ice in a bag.  Place a towel between your skin and the bag.  Leave the ice on for 15 to 20 minutes, 3 to 4 times a day.  Follow up with your caregiver for continued problems and no reason can be found for the pain. If the pain becomes worse or does not go away,  it may be necessary to repeat tests or do additional testing. Your caregiver may need to look further for a possible cause. SEEK IMMEDIATE MEDICAL CARE IF:  You have pain that is getting worse and is not relieved by medications.  You develop chest pain that is associated with shortness or breath, sweating, feeling sick to your stomach (nauseous), or throw up (vomit).  Your pain becomes localized to the abdomen.  You develop any new symptoms that seem different or that concern you. MAKE SURE YOU:   Understand these instructions.  Will watch your condition.  Will get help right away if you are not doing well or get worse.   This information is not intended to replace advice given to you by your health care provider. Make sure you discuss any questions you have with your health care provider.   Document Released: 02/08/2005 Document Revised: 05/03/2011 Document Reviewed: 10/13/2012 Elsevier Interactive Patient Education [employer]2016 Elsevier Inc.

## 2015-01-27 NOTE — ED Notes (Signed)
Patient c/o pain in her right upper leg for the past 3 weeks.  Patient denies injury.

## 2015-02-03 ENCOUNTER — Other Ambulatory Visit: Payer: BC Managed Care – PPO

## 2015-02-11 ENCOUNTER — Other Ambulatory Visit: Payer: Self-pay | Admitting: Family Medicine

## 2015-03-10 ENCOUNTER — Telehealth: Payer: Self-pay | Admitting: Obstetrics and Gynecology

## 2015-03-10 DIAGNOSIS — N200 Calculus of kidney: Secondary | ICD-10-CM

## 2015-03-10 NOTE — Telephone Encounter (Signed)
This patient has a one-year follow up on renal stones on 03/20/2015.  Do you want her to have a KUB prior to appt?  If so, will you please place an order?

## 2015-03-10 NOTE — Telephone Encounter (Signed)
Yes she does.  I placed the order.

## 2015-03-18 ENCOUNTER — Other Ambulatory Visit: Payer: Self-pay | Admitting: Family Medicine

## 2015-03-20 ENCOUNTER — Ambulatory Visit: Payer: Self-pay | Admitting: Obstetrics and Gynecology

## 2015-04-05 ENCOUNTER — Ambulatory Visit
Admission: EM | Admit: 2015-04-05 | Discharge: 2015-04-05 | Disposition: A | Discharge status: Home / Self Care | Payer: BC Managed Care – PPO | Attending: Family Medicine | Admitting: Family Medicine

## 2015-04-05 DIAGNOSIS — R8299 Other abnormal findings in urine: Secondary | ICD-10-CM

## 2015-04-05 DIAGNOSIS — R3129 Other microscopic hematuria: Secondary | ICD-10-CM | POA: Diagnosis not present

## 2015-04-05 DIAGNOSIS — R1010 Upper abdominal pain, unspecified: Secondary | ICD-10-CM | POA: Diagnosis not present

## 2015-04-05 DIAGNOSIS — R82998 Other abnormal findings in urine: Secondary | ICD-10-CM

## 2015-04-05 LAB — COMPREHENSIVE METABOLIC PANEL
ALBUMIN: 3.8 g/dL (ref 3.5–5.0)
ALT: 13 U/L — ABNORMAL LOW (ref 14–54)
AST: 17 U/L (ref 15–41)
Alkaline Phosphatase: 96 U/L (ref 38–126)
Anion gap: 5 (ref 5–15)
BILIRUBIN TOTAL: 0.7 mg/dL (ref 0.3–1.2)
BUN: 9 mg/dL (ref 6–20)
CO2: 24 mmol/L (ref 22–32)
Calcium: 8.7 mg/dL — ABNORMAL LOW (ref 8.9–10.3)
Chloride: 105 mmol/L (ref 101–111)
Creatinine, Ser: 0.46 mg/dL (ref 0.44–1.00)
GFR calc Af Amer: >60 mL/min (ref >60)
GFR calc non Af Amer: >60 mL/min (ref >60)
GLUCOSE: 91 mg/dL (ref 65–99)
POTASSIUM: 3.6 mmol/L (ref 3.5–5.1)
SODIUM: 134 mmol/L — AB (ref 135–145)
TOTAL PROTEIN: 7.5 g/dL (ref 6.5–8.1)

## 2015-04-05 LAB — URINALYSIS COMPLETE WITH MICROSCOPIC (ARMC ONLY)
Glucose, UA: NEGATIVE mg/dL
Leukocytes, UA: NEGATIVE
Nitrite: NEGATIVE
PH: 7.5 (ref 5.0–8.0)
PROTEIN: 30 mg/dL — AB
Specific Gravity, Urine: 1.025 (ref 1.005–1.030)

## 2015-04-05 LAB — CBC WITH DIFFERENTIAL/PLATELET
BASOS ABS: 0 10*3/uL (ref 0–0.1)
BASOS PCT: 1 %
Eosinophils Absolute: 0.1 10*3/uL (ref 0–0.7)
Eosinophils Relative: 1 %
HEMATOCRIT: 34.7 % — AB (ref 35.0–47.0)
HEMOGLOBIN: 11.3 g/dL — AB (ref 12.0–16.0)
Lymphocytes Relative: 30 %
Lymphs Abs: 1.9 10*3/uL (ref 1.0–3.6)
MCH: 25 pg — ABNORMAL LOW (ref 26.0–34.0)
MCHC: 32.6 g/dL (ref 32.0–36.0)
MCV: 76.6 fL — ABNORMAL LOW (ref 80.0–100.0)
MONO ABS: 0.5 10*3/uL (ref 0.2–0.9)
Monocytes Relative: 8 %
NEUTROS ABS: 4 10*3/uL (ref 1.4–6.5)
NEUTROS PCT: 62 %
Platelets: 237 10*3/uL (ref 150–440)
RBC: 4.53 MIL/uL (ref 3.80–5.20)
RDW: 14.7 % — AB (ref 11.5–14.5)
WBC: 6.4 10*3/uL (ref 3.6–11.0)

## 2015-04-05 MED ORDER — ONDANSETRON 8 MG PO TBDP: 8.0000 mg | ORAL_TABLET | Freq: Two times a day (BID) | ORAL | Status: DC

## 2015-04-05 NOTE — ED Notes (Signed)
Patient complains of abdominal pain. Patient has noticed that the pain is generalized but, at times will radiate to her back. She states that last night the pain was so bad that it woke her up. Patient states that she has been feeling full and bloated. She reports that she has not been eating as much as usual.

## 2015-04-05 NOTE — ED Provider Notes (Signed)
CSN: [id number]     Arrival date & time 04/05/15  1052 History   First MD Initiated Contact with Patient 04/05/15 1354     Chief Complaint  Patient presents with  . Abdominal Pain   (Consider location/radiation/quality/duration/timing/severity/associated sxs/prior Treatment) HPI Comments: 50 yo female with a 2-3 months h/o generalized abdominal pain worse in the upper abdominal area. Denies any fevers, chills, vomiting, diarrhea, constipation, melena, hematochezia, dysuria, gross hematuria, vaginal discharge.   Patient is a 50 y.o. female presenting with abdominal pain. The history is provided by the patient.  Abdominal Pain   History reviewed. No pertinent past medical history. Past Surgical History  Procedure Laterality Date  . Hernia repair    . Appendectomy  2008   Family History  Problem Relation Age of Onset  . Atrial fibrillation Mother   . Atrial fibrillation Father   . Hypertension Mother   . Diabetes Mother    Social History  Substance Use Topics  . Smoking status: Never Smoker   . Smokeless tobacco: None  . Alcohol Use: No   OB History    No data available     Review of Systems  Gastrointestinal: Positive for abdominal pain.    Allergies  Oxycodone-acetaminophen; Fenofibrate; and Sertraline  Home Medications   Prior to Admission medications   Medication Sig Start Date End Date Taking? Authorizing Provider  ALPRAZolam Prudy Feeler) 0.5 MG tablet Take 1 tablet by mouth daily. 09/26/13  Yes Historical Provider, MD  metoprolol tartrate (LOPRESSOR) 25 MG tablet TAKE ONE-HALF TABLET BY MOUTH TWICE DAILY 03/18/15  Yes Duanne Limerick, MD  omeprazole (PRILOSEC) 20 MG capsule Take 20 mg by mouth daily.   Yes Historical Provider, MD  simvastatin (ZOCOR) 40 MG tablet TAKE ONE TABLET BY MOUTH ONCE DAILY NEED  TO  SCHEDULE  AN  APPOINTMENT 03/18/15  Yes Duanne Limerick, MD  dicyclomine (BENTYL) 10 MG capsule Take 1 capsule (10 mg total) by mouth 4 (four) times daily -  before  meals and at bedtime. 12/26/14   Duanne Limerick, MD  esomeprazole (NEXIUM) 40 MG capsule Take 1 capsule by mouth daily. Dr Servando Snare    Historical Provider, MD  meloxicam (MOBIC) 15 MG tablet Take 1 tablet (15 mg total) by mouth daily as needed for pain. 01/27/15   Renford Dills, NP  ondansetron (ZOFRAN ODT) 8 MG disintegrating tablet Take 1 tablet (8 mg total) by mouth 2 (two) times daily. 04/05/15   Payton Mccallum, MD   Meds Ordered and Administered this Visit  Medications - No data to display  BP 139/80 mmHg  Pulse 64  Temp(Src) 98.2 F (36.8 C) (Tympanic)  Resp 16  Ht  (1.803 m)  Wt 210 lb (95.255 kg)  BMI 29.30 kg/m2  SpO2 100%  LMP 03/17/2015 No data found.   Physical Exam  Constitutional: She appears well-developed.  Cardiovascular: Normal rate, regular rhythm, normal heart sounds and intact distal pulses.   No murmur heard. Pulmonary/Chest: Effort normal and breath sounds normal. No respiratory distress. She has no wheezes. She has no rales.  Abdominal: Soft. Bowel sounds are normal. She exhibits no distension and no mass. There is tenderness (mild tenderness to palpation left and right upper quadrants; no rebound or guarding). There is no rebound and no guarding.  Neurological: She is alert.  Skin: Skin is warm and dry. No rash noted. She is not diaphoretic. No erythema.  Nursing note and vitals reviewed.   ED Course  Procedures (including critical care  time)  Labs Review Labs Reviewed  URINALYSIS COMPLETEWITH MICROSCOPIC ([facility] ONLY) - Abnormal; Notable for the following:    Bilirubin Urine 1+ (*)    Ketones, ur TRACE (*)    Hgb urine dipstick 1+ (*)    Protein, ur 30 (*)    Bacteria, UA FEW (*)    Squamous Epithelial / LPF 0-5 (*)    All other components within normal limits  CBC WITH DIFFERENTIAL/PLATELET - Abnormal; Notable for the following:    Hemoglobin 11.3 (*)    HCT 34.7 (*)    MCV 76.6 (*)    MCH 25.0 (*)    RDW 14.7 (*)    All other components  within normal limits  COMPREHENSIVE METABOLIC PANEL - Abnormal; Notable for the following:    Sodium 134 (*)    Calcium 8.7 (*)    ALT 13 (*)    All other components within normal limits    Imaging Review No results found.   Visual Acuity Review  Right Eye Distance:   Left Eye Distance:   Bilateral Distance:    Right Eye Near:   Left Eye Near:    Bilateral Near:         MDM   1. Pain of upper abdomen   2. Crystalluria   3. Microscopic hematuria    Discharge Medication List as of 04/05/2015  3:22 PM    START taking these medications   Details  ondansetron (ZOFRAN ODT) 8 MG disintegrating tablet Take 1 tablet (8 mg total) by mouth 2 (two) times daily., Starting 04/05/2015, Until Discontinued, Print       1. Lab results and diagnosis reviewed with patient 2. rx as per orders above; reviewed possible side effects, interactions, risks and benefits  3. Recommend supportive treatment with increased water intake, otc analgesics 4. Follow-up prn if symptoms worsen or don't improve    Payton Mccallum, MD 04/05/15 1702

## 2015-04-07 ENCOUNTER — Ambulatory Visit (INDEPENDENT_AMBULATORY_CARE_PROVIDER_SITE_OTHER): Payer: BC Managed Care – PPO | Admitting: Family Medicine

## 2015-04-07 ENCOUNTER — Encounter: Payer: Self-pay | Admitting: Family Medicine

## 2015-04-07 VITALS — BP 120/80 | HR 76 | Temp 98.5°F | Ht 71.0 in | Wt 222.0 lb

## 2015-04-07 DIAGNOSIS — R1013 Epigastric pain: Secondary | ICD-10-CM

## 2015-04-07 LAB — HEMOCCULT GUIAC POC 1CARD (OFFICE): FECAL OCCULT BLD: NEGATIVE

## 2015-04-07 NOTE — Progress Notes (Signed)
Name: Sherri Matthews   MRN: [id number]    DOB: [birth year]   Date:04/07/2015       Progress Note  Subjective  Chief Complaint  Chief Complaint  Patient presents with  . Abdominal Pain    having abdominal pain especially on the R) side for approx 3 months- getting worse x 6 weeks. Has noticed what appears to be dried blood on back of tongue    Abdominal Pain This is a recurrent problem. The current episode started more than 1 month ago. The onset quality is gradual. The problem occurs daily. The problem has been gradually worsening. The pain is located in the LUQ and RUQ. The pain is at a severity of 7/10. The pain is moderate. Quality: "deep achey" The abdominal pain radiates to the back (Between scapulas). Associated symptoms include nausea. Pertinent negatives include no anorexia, belching, constipation, diarrhea, dysuria, fever, frequency, headaches, hematochezia, hematuria, melena, myalgias, vomiting or weight loss. Nothing aggravates the pain. The pain is relieved by nothing. She has tried proton pump inhibitors (otc) for the symptoms. Prior diagnostic workup includes CT scan, GI consult, lower endoscopy, upper endoscopy and ultrasound. Her past medical history is significant for abdominal surgery. There is no history of Crohn's disease, gallstones, irritable bowel syndrome, pancreatitis, PUD or ulcerative colitis.    No problem-specific assessment & plan notes found for this encounter.   No past medical history on file.  Past Surgical History  Procedure Laterality Date  . Hernia repair    . Appendectomy  2008    Family History  Problem Relation Age of Onset  . Atrial fibrillation Mother   . Atrial fibrillation Father   . Hypertension Mother   . Diabetes Mother     Social History   Social History  . Marital Status: Married    Spouse Name: N/A  . Number of Children: N/A  . Years of Education: N/A   Occupational History  . Not on file.   Social History Main Topics   . Smoking status: Never Smoker   . Smokeless tobacco: Not on file  . Alcohol Use: No  . Drug Use: No  . Sexual Activity: Not on file   Other Topics Concern  . Not on file   Social History Narrative    Allergies  Allergen Reactions  . Oxycodone-Acetaminophen Itching  . Fenofibrate     myalgia  . Sertraline Other (See Comments)    Pt prefers to never be on this again.       Review of Systems  Constitutional: Negative for fever, chills, weight loss and malaise/fatigue.  HENT: Negative for ear discharge, ear pain and sore throat.   Eyes: Negative for blurred vision.  Respiratory: Negative for cough, sputum production, shortness of breath and wheezing.   Cardiovascular: Negative for chest pain, palpitations and leg swelling.  Gastrointestinal: Positive for nausea and abdominal pain. Negative for heartburn, vomiting, diarrhea, constipation, blood in stool, melena, hematochezia and anorexia.  Genitourinary: Negative for dysuria, urgency, frequency and hematuria.  Musculoskeletal: Negative for myalgias, back pain, joint pain and neck pain.  Skin: Negative for rash.  Neurological: Negative for dizziness, tingling, sensory change, focal weakness and headaches.  Endo/Heme/Allergies: Negative for environmental allergies and polydipsia. Does not bruise/bleed easily.  Psychiatric/Behavioral: Negative for depression and suicidal ideas. The patient is not nervous/anxious and does not have insomnia.      Objective  Filed Vitals:   04/07/15 1048  BP: 120/80  Pulse: 76  Temp: 98.5 F (36.9 C)  TempSrc: Oral  Height:  (1.803 m)  Weight: 222 lb (100.699 kg)    Physical Exam  Constitutional: She is well-developed, well-nourished, and in no distress. No distress.  HENT:  Head: Normocephalic and atraumatic.  Right Ear: External ear normal.  Left Ear: External ear normal.  Nose: Nose normal.  Mouth/Throat: Oropharynx is clear and moist.  Eyes: Conjunctivae and EOM are  normal. Pupils are equal, round, and reactive to light. Right eye exhibits no discharge. Left eye exhibits no discharge.  Neck: Normal range of motion. Neck supple. No JVD present. No thyromegaly present.  Cardiovascular: Normal rate, regular rhythm, normal heart sounds and intact distal pulses.  Exam reveals no gallop and no friction rub.   No murmur heard. Pulmonary/Chest: Effort normal and breath sounds normal.  Abdominal: Soft. Bowel sounds are normal. She exhibits no mass. There is no tenderness. There is no guarding.  Musculoskeletal: Normal range of motion. She exhibits no edema.  Lymphadenopathy:    She has no cervical adenopathy.  Neurological: She is alert. She has normal reflexes.  Skin: Skin is warm and dry. She is not diaphoretic.  Psychiatric: Mood and affect normal.  Nursing note and vitals reviewed.     Assessment & Plan  Problem List Items Addressed This Visit    None    Visit Diagnoses    Epigastric pain    -  Primary    Relevant Orders    Hepatic Function Panel (6)    H. pylori antibody, IgG    Amylase    Ambulatory referral to Gastroenterology    POCT Occult Blood Stool (Completed)         Dr. Elizabeth Sauer [facility]  04/07/2015

## 2015-04-08 LAB — H. PYLORI ANTIBODY, IGG

## 2015-04-08 LAB — HEPATIC FUNCTION PANEL (6)
ALK PHOS: 106 IU/L (ref 39–117)
ALT: 9 IU/L (ref 0–32)
AST: 14 IU/L (ref 0–40)
Albumin: 3.8 g/dL (ref 3.5–5.5)
BILIRUBIN TOTAL: 0.4 mg/dL (ref 0.0–1.2)
BILIRUBIN, DIRECT: 0.11 mg/dL (ref 0.00–0.40)

## 2015-04-08 LAB — AMYLASE: Amylase: 20 U/L — ABNORMAL LOW (ref 31–124)

## 2015-04-09 ENCOUNTER — Encounter: Payer: Self-pay | Admitting: Gastroenterology

## 2015-04-09 ENCOUNTER — Encounter (INDEPENDENT_AMBULATORY_CARE_PROVIDER_SITE_OTHER): Payer: Self-pay

## 2015-04-09 ENCOUNTER — Ambulatory Visit
Admission: EM | Admit: 2015-04-09 | Discharge: 2015-04-09 | Disposition: A | Discharge status: Home / Self Care | Payer: BC Managed Care – PPO | Attending: Family Medicine | Admitting: Family Medicine

## 2015-04-09 ENCOUNTER — Encounter: Payer: Self-pay | Admitting: Emergency Medicine

## 2015-04-09 ENCOUNTER — Ambulatory Visit (INDEPENDENT_AMBULATORY_CARE_PROVIDER_SITE_OTHER): Payer: BC Managed Care – PPO | Admitting: Gastroenterology

## 2015-04-09 ENCOUNTER — Other Ambulatory Visit: Payer: Self-pay

## 2015-04-09 VITALS — BP 137/73 | HR 62 | Temp 98.7°F | Ht 71.0 in | Wt 224.0 lb

## 2015-04-09 DIAGNOSIS — G8929 Other chronic pain: Secondary | ICD-10-CM

## 2015-04-09 DIAGNOSIS — R1010 Upper abdominal pain, unspecified: Secondary | ICD-10-CM

## 2015-04-09 DIAGNOSIS — R1012 Left upper quadrant pain: Secondary | ICD-10-CM | POA: Diagnosis not present

## 2015-04-09 DIAGNOSIS — N390 Urinary tract infection, site not specified: Secondary | ICD-10-CM

## 2015-04-09 DIAGNOSIS — R1011 Right upper quadrant pain: Principal | ICD-10-CM

## 2015-04-09 LAB — URINALYSIS COMPLETE WITH MICROSCOPIC (ARMC ONLY)
Bilirubin Urine: NEGATIVE
Glucose, UA: NEGATIVE mg/dL
Ketones, ur: NEGATIVE mg/dL
NITRITE: POSITIVE — AB
PROTEIN: 100 mg/dL — AB
SPECIFIC GRAVITY, URINE: 1.01 (ref 1.005–1.030)
Squamous Epithelial / LPF: NONE SEEN
pH: 6 (ref 5.0–8.0)

## 2015-04-09 MED ORDER — CIPROFLOXACIN HCL 500 MG PO TABS: 500.0000 mg | ORAL_TABLET | Freq: Two times a day (BID) | ORAL | Status: DC

## 2015-04-09 MED ORDER — PHENAZOPYRIDINE HCL 200 MG PO TABS
200.0000 mg | ORAL_TABLET | Freq: Once | ORAL | Status: AC
  Administered 2015-04-09: 200 mg via ORAL

## 2015-04-09 MED ORDER — CIPROFLOXACIN HCL 500 MG PO TABS
500.0000 mg | ORAL_TABLET | Freq: Once | ORAL | Status: AC
  Administered 2015-04-09: 500 mg via ORAL

## 2015-04-09 NOTE — ED Provider Notes (Signed)
CSN: [id number]     Arrival date & time 04/09/15  1714 History   First MD Initiated Contact with Patient 04/09/15 1812     Chief Complaint  Patient presents with  . Dysuria  . Abdominal Pain   (Consider location/radiation/quality/duration/timing/severity/associated sxs/prior Treatment) Patient is a 50 y.o. female presenting with dysuria. The history is provided by the patient.  Dysuria Pain quality:  Burning Pain severity:  Mild Onset quality:  Sudden Duration:  6 hours (states urinary urgency, frequency, burning and blood started today in the afternoon) Timing:  Constant Progression:  Worsening Chronicity:  New Recent urinary tract infections: no   Relieved by:  None tried Ineffective treatments:  None tried Urinary symptoms: discolored urine, frequent urination and hematuria   Urinary symptoms: no foul-smelling urine   Associated symptoms: no fever, no nausea, no vaginal discharge and no vomiting   Risk factors: hx of urolithiasis   Risk factors: no recurrent urinary tract infections     Past Medical History  Diagnosis Date  . Hypertension   . Hyperlipidemia    Past Surgical History  Procedure Laterality Date  . Hernia repair    . Appendectomy  2008   Family History  Problem Relation Age of Onset  . Atrial fibrillation Mother   . Atrial fibrillation Father   . Hypertension Mother   . Diabetes Mother    Social History  Substance Use Topics  . Smoking status: Never Smoker   . Smokeless tobacco: Never Used  . Alcohol Use: No   OB History    No data available     Review of Systems  Constitutional: Negative for fever.  Gastrointestinal: Negative for nausea and vomiting.  Genitourinary: Positive for dysuria. Negative for vaginal discharge.    Allergies  Oxycodone-acetaminophen; Fenofibrate; and Sertraline  Home Medications   Prior to Admission medications   Medication Sig Start Date End Date Taking? Authorizing Provider  ALPRAZolam Prudy Feeler) 0.5 MG tablet  Take 1 tablet by mouth daily. 09/26/13   Historical Provider, MD  ciprofloxacin (CIPRO) 500 MG tablet Take 1 tablet (500 mg total) by mouth every 12 (twelve) hours. 04/09/15   Payton Mccallum, MD  esomeprazole (NEXIUM) 20 MG capsule Take by mouth. Reported on 04/09/2015    Historical Provider, MD  metoprolol tartrate (LOPRESSOR) 25 MG tablet TAKE ONE-HALF TABLET BY MOUTH TWICE DAILY 03/18/15   Duanne Limerick, MD  omeprazole (PRILOSEC) 20 MG capsule Take 20 mg by mouth daily.    Historical Provider, MD  ondansetron (ZOFRAN ODT) 8 MG disintegrating tablet Take 1 tablet (8 mg total) by mouth 2 (two) times daily. Patient not taking: Reported on 04/07/2015 04/05/15   Payton Mccallum, MD  simvastatin (ZOCOR) 40 MG tablet TAKE ONE TABLET BY MOUTH ONCE DAILY NEED  TO  SCHEDULE  AN  APPOINTMENT 03/18/15   Duanne Limerick, MD  tiZANidine (ZANAFLEX) 2 MG tablet Take by mouth. Reported on 04/09/2015 04/09/14   Historical Provider, MD   Meds Ordered and Administered this Visit   Medications  phenazopyridine (PYRIDIUM) tablet 200 mg (200 mg Oral Given 04/09/15 1828)  ciprofloxacin (CIPRO) tablet 500 mg (500 mg Oral Given 04/09/15 1828)    BP 159/84 mmHg  Pulse 73  Temp(Src) 98.7 F (37.1 C) (Tympanic)  Resp 16  Ht  (1.803 m)  Wt 215 lb (97.523 kg)  BMI 30.00 kg/m2  SpO2 100%  LMP 03/17/2015 No data found.   Physical Exam  Constitutional: She appears well-developed and well-nourished. No distress.  Abdominal:  Soft. Bowel sounds are normal. She exhibits no distension and no mass. There is tenderness (suprapubic). There is no rebound and no guarding.  Skin: She is not diaphoretic.  Nursing note and vitals reviewed.   ED Course  Procedures (including critical care time)  Labs Review Labs Reviewed  URINALYSIS COMPLETEWITH MICROSCOPIC ([facility] ONLY) - Abnormal; Notable for the following:    Color, Urine AMBER (*)    APPearance CLOUDY (*)    Hgb urine dipstick 3+ (*)    Protein, ur 100 (*)    Nitrite  POSITIVE (*)    Leukocytes, UA 1+ (*)    Bacteria, UA RARE (*)    All other components within normal limits  URINE CULTURE    Imaging Review No results found.   Visual Acuity Review  Right Eye Distance:   Left Eye Distance:   Bilateral Distance:    Right Eye Near:   Left Eye Near:    Bilateral Near:         MDM   1. UTI (lower urinary tract infection)    Discharge Medication List as of 04/09/2015  6:31 PM    START taking these medications   Details  ciprofloxacin (CIPRO) 500 MG tablet Take 1 tablet (500 mg total) by mouth every 12 (twelve) hours., Starting 04/09/2015, Until Discontinued, Normal      1. Lab results and diagnosis reviewed with patient 2. rx as per orders above; reviewed possible side effects, interactions, risks and benefits  3. Recommend supportive treatment with increased fluids, otc analgesics 4. Follow-up prn if symptoms worsen or don't improve     Payton Mccallum, MD 04/09/15 1857

## 2015-04-09 NOTE — Progress Notes (Signed)
Primary Care Physician: Elizabeth Sauer, MD  Primary Gastroenterologist:  Dr. Midge Minium  Chief Complaint  Patient presents with  . Abdominal Pain    HPI: Sherri Matthews is a 50 y.o. female here with a report of abdominal pain. The patient reports that the abdominal pain is in the right and left side of her abdomen with the right side being worse. The patient states that the pain will wake her up in the left 9. The patient is helped with placing a heating pad on the area. There is no association with eating or drinking. There is also no association with any bowel movements or being constipated. There is no report of any unexplained weight loss. The patient states that she had a CT scan about a year ago for some similar symptoms that did not show any cause for her pain. The patient had a colonoscopy in the past that did not show anything as the cause of her abdominal pain. She states that sometimes the pain will radiate to the middle part of her back. There is no associated nausea or vomiting. The patient reports that she has woken up in the middle the night and in the morning with blood on her lips and on her tongue.  Current Outpatient Prescriptions  Medication Sig Dispense Refill  . ALPRAZolam (XANAX) 0.5 MG tablet Take 1 tablet by mouth daily.    . metoprolol tartrate (LOPRESSOR) 25 MG tablet TAKE ONE-HALF TABLET BY MOUTH TWICE DAILY 30 tablet 0  . omeprazole (PRILOSEC) 20 MG capsule Take 20 mg by mouth daily.    . simvastatin (ZOCOR) 40 MG tablet TAKE ONE TABLET BY MOUTH ONCE DAILY NEED  TO  SCHEDULE  AN  APPOINTMENT 30 tablet 0  . esomeprazole (NEXIUM) 20 MG capsule Take by mouth. Reported on 04/09/2015    . ondansetron (ZOFRAN ODT) 8 MG disintegrating tablet Take 1 tablet (8 mg total) by mouth 2 (two) times daily. (Patient not taking: Reported on 04/07/2015) 6 tablet 0  . tiZANidine (ZANAFLEX) 2 MG tablet Take by mouth. Reported on 04/09/2015     No current facility-administered  medications for this visit.    Allergies as of 04/09/2015 - Review Complete 04/09/2015  Allergen Reaction Noted  . Oxycodone-acetaminophen Itching 12/26/2014  . Fenofibrate  07/16/2014  . Sertraline Other (See Comments) 12/26/2014    ROS:  General: Negative for anorexia, weight loss, fever, chills, fatigue, weakness. ENT: Negative for hoarseness, difficulty swallowing , nasal congestion. CV: Negative for chest pain, angina, palpitations, dyspnea on exertion, peripheral edema.  Respiratory: Negative for dyspnea at rest, dyspnea on exertion, cough, sputum, wheezing.  GI: See history of present illness. GU:  Negative for dysuria, hematuria, urinary incontinence, urinary frequency, nocturnal urination.  Endo: Negative for unusual weight change.    Physical Examination:   BP 137/73 mmHg  Pulse 62  Temp(Src) 98.7 F (37.1 C) (Oral)  Ht  (1.803 m)  Wt 224 lb (101.606 kg)  BMI 31.26 kg/m2  LMP 03/17/2015  General: Well-nourished, well-developed in no acute distress.  Eyes: No icterus. Conjunctivae pink. Mouth: Oropharyngeal mucosa moist and pink , no lesions erythema or exudate. Lungs: Clear to auscultation bilaterally. Non-labored. Heart: Regular rate and rhythm, no murmurs rubs or gallops.  Abdomen: Bowel sounds are normal, nontender, nondistended, no hepatosplenomegaly or masses, no abdominal bruits or hernia , no rebound or guarding.   Extremities: No lower extremity edema. No clubbing or deformities. Neuro: Alert and oriented x 3.  Grossly intact. Skin:  Warm and dry, no jaundice.   Psych: Alert and cooperative, normal mood and affect.  Labs:    Imaging Studies: No results found.  Assessment and Plan:   Sherri Matthews is a 50 y.o. y/o female who comes in today with abdominal pain on the left than the right that sound to be musculoskeletal on history but not on physical exam. The patient also has been having some blood on her tongue and lips when she wakes up in  the morning. The patient will be set up for an upper endoscopy to look for any gastric or esophageal bleeding source. She will also be set up for a right upper quadrant ultrasound with a HIDA scan with CCK. The patient has been explained the plan and agrees with it.   Note: This dictation was prepared with Dragon dictation along with smaller phrase technology. Any transcriptional errors that result from this process are unintentional.

## 2015-04-09 NOTE — Discharge Instructions (Signed)

## 2015-04-09 NOTE — ED Notes (Signed)
Patient c/o ongoing pain in her lower abdomen and increase in urinary urgency since Saturday.  Patient states that she was seen here at [facility] on Saturday and then follow-up with her PCP Dr. Yetta Barre this week and has a scheduled appointment with Dr. Servando Snare in Gastroenterology.  Patient reports seeing more blood in her urine.

## 2015-04-10 ENCOUNTER — Telehealth: Payer: Self-pay | Admitting: *Deleted

## 2015-04-10 NOTE — ED Notes (Signed)
Patient called to report nausea and vomiting while taking Cipro prescribed for her UTI. Dr Thurmond Butts was informed of patient symptoms and ordered Septra DS BID for 7 days. Prescription was called into [facility] pharmacy and patient notified that prescription is available.

## 2015-04-11 LAB — URINE CULTURE

## 2015-04-14 ENCOUNTER — Other Ambulatory Visit: Payer: Self-pay

## 2015-04-16 ENCOUNTER — Other Ambulatory Visit: Payer: Self-pay

## 2015-04-16 DIAGNOSIS — I10 Essential (primary) hypertension: Secondary | ICD-10-CM

## 2015-04-16 DIAGNOSIS — E785 Hyperlipidemia, unspecified: Secondary | ICD-10-CM

## 2015-04-16 MED ORDER — SIMVASTATIN 40 MG PO TABS: 40.0000 mg | ORAL_TABLET | Freq: Every day | ORAL | Status: DC

## 2015-04-16 MED ORDER — METOPROLOL TARTRATE 25 MG PO TABS: 12.5000 mg | ORAL_TABLET | Freq: Two times a day (BID) | ORAL | Status: DC

## 2015-04-17 NOTE — Discharge Instructions (Signed)

## 2015-04-18 ENCOUNTER — Ambulatory Visit: Payer: BC Managed Care – PPO | Admitting: Anesthesiology

## 2015-04-18 ENCOUNTER — Ambulatory Visit
Admission: RE | Admit: 2015-04-18 | Discharge: 2015-04-18 | Disposition: A | Discharge status: Home / Self Care | Payer: BC Managed Care – PPO | Source: Ambulatory Visit | Attending: Gastroenterology | Admitting: Gastroenterology

## 2015-04-18 ENCOUNTER — Encounter
Admission: RE | Disposition: A | Discharge status: Home / Self Care | Payer: Self-pay | Source: Ambulatory Visit | Attending: Gastroenterology

## 2015-04-18 DIAGNOSIS — Z888 Allergy status to other drugs, medicaments and biological substances status: Secondary | ICD-10-CM | POA: Insufficient documentation

## 2015-04-18 DIAGNOSIS — E785 Hyperlipidemia, unspecified: Secondary | ICD-10-CM | POA: Insufficient documentation

## 2015-04-18 DIAGNOSIS — Z79899 Other long term (current) drug therapy: Secondary | ICD-10-CM | POA: Insufficient documentation

## 2015-04-18 DIAGNOSIS — R1011 Right upper quadrant pain: Secondary | ICD-10-CM | POA: Diagnosis not present

## 2015-04-18 DIAGNOSIS — M5030 Other cervical disc degeneration, unspecified cervical region: Secondary | ICD-10-CM | POA: Insufficient documentation

## 2015-04-18 DIAGNOSIS — Z833 Family history of diabetes mellitus: Secondary | ICD-10-CM | POA: Diagnosis not present

## 2015-04-18 DIAGNOSIS — Z9889 Other specified postprocedural states: Secondary | ICD-10-CM | POA: Insufficient documentation

## 2015-04-18 DIAGNOSIS — R1012 Left upper quadrant pain: Secondary | ICD-10-CM | POA: Diagnosis not present

## 2015-04-18 DIAGNOSIS — Z885 Allergy status to narcotic agent status: Secondary | ICD-10-CM | POA: Insufficient documentation

## 2015-04-18 DIAGNOSIS — Z8249 Family history of ischemic heart disease and other diseases of the circulatory system: Secondary | ICD-10-CM | POA: Diagnosis not present

## 2015-04-18 DIAGNOSIS — K2950 Unspecified chronic gastritis without bleeding: Secondary | ICD-10-CM | POA: Diagnosis not present

## 2015-04-18 DIAGNOSIS — K2970 Gastritis, unspecified, without bleeding: Secondary | ICD-10-CM | POA: Insufficient documentation

## 2015-04-18 DIAGNOSIS — Z9049 Acquired absence of other specified parts of digestive tract: Secondary | ICD-10-CM | POA: Diagnosis not present

## 2015-04-18 DIAGNOSIS — I341 Nonrheumatic mitral (valve) prolapse: Secondary | ICD-10-CM | POA: Diagnosis not present

## 2015-04-18 DIAGNOSIS — I10 Essential (primary) hypertension: Secondary | ICD-10-CM | POA: Insufficient documentation

## 2015-04-18 HISTORY — DX: Nonrheumatic mitral (valve) prolapse: I34.1

## 2015-04-18 HISTORY — PX: ESOPHAGOGASTRODUODENOSCOPY (EGD) WITH PROPOFOL: SHX5813

## 2015-04-18 HISTORY — DX: Other specified conduction disorders: I45.89

## 2015-04-18 HISTORY — DX: Palpitations: R00.2

## 2015-04-18 HISTORY — DX: Cardiac murmur, unspecified: R01.1

## 2015-04-18 HISTORY — DX: Cervical disc disorder, unspecified, unspecified cervical region: M50.90

## 2015-04-18 HISTORY — DX: Sleep apnea, unspecified: G47.30

## 2015-04-18 SURGERY — ESOPHAGOGASTRODUODENOSCOPY (EGD) WITH PROPOFOL
Anesthesia: Monitor Anesthesia Care

## 2015-04-18 MED ORDER — LIDOCAINE HCL (CARDIAC) 20 MG/ML IV SOLN
INTRAVENOUS | Status: DC | PRN
  Administered 2015-04-18: 50 mg via INTRAVENOUS

## 2015-04-18 MED ORDER — PANTOPRAZOLE SODIUM 40 MG PO TBEC: 40.0000 mg | DELAYED_RELEASE_TABLET | Freq: Every day | ORAL | Status: DC

## 2015-04-18 MED ORDER — STERILE WATER FOR IRRIGATION IR SOLN
Status: DC | PRN
  Administered 2015-04-18: 12:00:00

## 2015-04-18 MED ORDER — LACTATED RINGERS IV SOLN
INTRAVENOUS | Status: DC
  Administered 2015-04-18: 11:00:00 via INTRAVENOUS

## 2015-04-18 MED ORDER — PROPOFOL 10 MG/ML IV BOLUS
INTRAVENOUS | Status: DC | PRN
  Administered 2015-04-18: 150 mg via INTRAVENOUS
  Administered 2015-04-18: 50 mg via INTRAVENOUS

## 2015-04-18 SURGICAL SUPPLY — 39 items
BALLN DILATOR 10-12 8 (BALLOONS)
BALLN DILATOR 12-15 8 (BALLOONS)
BALLN DILATOR 15-18 8 (BALLOONS)
BALLN DILATOR CRE 0-12 8 (BALLOONS)
BALLN DILATOR ESOPH 8 10 CRE (MISCELLANEOUS) IMPLANT
BALLOON DILATOR 12-15 8 (BALLOONS) IMPLANT
BALLOON DILATOR 15-18 8 (BALLOONS) IMPLANT
BALLOON DILATOR CRE 0-12 8 (BALLOONS) IMPLANT
BLOCK BITE 60FR ADLT L/F GRN (MISCELLANEOUS) ×2 IMPLANT
CANISTER SUCT 1200ML W/VALVE (MISCELLANEOUS) ×2 IMPLANT
FCP ESCP3.2XJMB 240X2.8X (MISCELLANEOUS)
FORCEPS BIOP RAD 4 LRG CAP 4 (CUTTING FORCEPS) IMPLANT
FORCEPS BIOP RJ4 240 W/NDL (MISCELLANEOUS)
FORCEPS ESCP3.2XJMB 240X2.8X (MISCELLANEOUS) IMPLANT
GOWN CVR UNV OPN BCK APRN NK (MISCELLANEOUS) ×2 IMPLANT
GOWN ISOL THUMB LOOP REG UNIV (MISCELLANEOUS) ×2
HEMOCLIP INSTINCT (CLIP) IMPLANT
INJECTOR VARIJECT VIN23 (MISCELLANEOUS) IMPLANT
KIT CO2 TUBING (TUBING) IMPLANT
KIT DEFENDO VALVE AND CONN (KITS) IMPLANT
KIT ENDO PROCEDURE OLY (KITS) ×2 IMPLANT
LIGATOR MULTIBAND 6SHOOTER MBL (MISCELLANEOUS) IMPLANT
MARKER SPOT ENDO TATTOO 5ML (MISCELLANEOUS) IMPLANT
PAD GROUND ADULT SPLIT (MISCELLANEOUS) IMPLANT
SNARE SHORT THROW 13M SML OVAL (MISCELLANEOUS) IMPLANT
SNARE SHORT THROW 30M LRG OVAL (MISCELLANEOUS) IMPLANT
SPOT EX ENDOSCOPIC TATTOO (MISCELLANEOUS)
SUCTION POLY TRAP 4CHAMBER (MISCELLANEOUS) IMPLANT
SYR INFLATION 60ML (SYRINGE) IMPLANT
TRAP SUCTION POLY (MISCELLANEOUS) IMPLANT
TUBING CONN 6MMX3.1M (TUBING)
TUBING SUCTION CONN 0.25 STRL (TUBING) IMPLANT
UNDERPAD 30X60 958B10 (PK) (MISCELLANEOUS) IMPLANT
VALVE BIOPSY ENDO (VALVE) IMPLANT
VARIJECT INJECTOR VIN23 (MISCELLANEOUS)
WATER AUXILLARY (MISCELLANEOUS) IMPLANT
WATER STERILE IRR 250ML POUR (IV SOLUTION) ×2 IMPLANT
WATER STERILE IRR 500ML POUR (IV SOLUTION) IMPLANT
WIRE CRE 18-20MM 8CM F G (MISCELLANEOUS) IMPLANT

## 2015-04-18 NOTE — Anesthesia Postprocedure Evaluation (Signed)
Anesthesia Post Note  Patient: Sherri Matthews  Procedure(s) Performed: Procedure(s) (LRB): ESOPHAGOGASTRODUODENOSCOPY (EGD) WITH PROPOFOL (N/A)  Patient location during evaluation: PACU Anesthesia Type: MAC Level of consciousness: awake and alert Pain management: pain level controlled Vital Signs Assessment: post-procedure vital signs reviewed and stable Respiratory status: spontaneous breathing, nonlabored ventilation, respiratory function stable and patient connected to nasal cannula oxygen Cardiovascular status: blood pressure returned to baseline and stable Postop Assessment: no signs of nausea or vomiting Anesthetic complications: no    Suha Schoenbeck C

## 2015-04-18 NOTE — Anesthesia Preprocedure Evaluation (Addendum)
Anesthesia Evaluation    Airway Mallampati: II  TM Distance: >3 FB Neck ROM: Full    Dental no notable dental hx.    Pulmonary sleep apnea ,    Pulmonary exam normal breath sounds clear to auscultation       Cardiovascular hypertension, Normal cardiovascular exam+ dysrhythmias + Valvular Problems/Murmurs MVP  Rhythm:Regular Rate:Normal + Systolic murmurs    Neuro/Psych negative psych ROS   GI/Hepatic GERD  Medicated and Controlled,  Endo/Other    Renal/GU   negative genitourinary   Musculoskeletal  (+) Arthritis ,   Abdominal   Peds  Hematology   Anesthesia Other Findings   Reproductive/Obstetrics negative OB ROS                            Anesthesia Physical Anesthesia Plan  ASA: II  Anesthesia Plan: MAC   Post-op Pain Management:    Induction: Intravenous  Airway Management Planned: Natural Airway and Mask  Additional Equipment:   Intra-op Plan:   Post-operative Plan: Extubation in OR  Informed Consent: I have reviewed the patients History and Physical, chart, labs and discussed the procedure including the risks, benefits and alternatives for the proposed anesthesia with the patient or authorized representative who has indicated his/her understanding and acceptance.   Dental advisory given  Plan Discussed with: CRNA  Anesthesia Plan Comments:         Anesthesia Quick Evaluation

## 2015-04-18 NOTE — H&P (Signed)
[facility]  [address] [place], [place] [address] Phone: [phone] Fax : [phone]  Primary Care Physician:  Elizabeth Sauer, MD Primary Gastroenterologist:  Dr. Servando Snare  Pre-Procedure History & Physical: HPI:  Sherri Matthews is a 50 y.o. female is here for an endoscopy.   Past Medical History  Diagnosis Date  . Hypertension   . Hyperlipidemia   . Heart murmur   . Mitral valve prolapse   . AV node dysfunction     tachycardia see Dr. Lady Gary  . Sleep apnea     possible but not tested  . Cervical disc disease     degenerative disk   . Palpitations     Past Surgical History  Procedure Laterality Date  . Hernia repair    . Appendectomy  2008    Prior to Admission medications   Medication Sig Start Date End Date Taking? Authorizing Provider  ALPRAZolam Prudy Feeler) 0.5 MG tablet Take 0.5 tablets by mouth daily as needed.  09/26/13  Yes Historical Provider, MD  dimenhyDRINATE (DRAMAMINE) 50 MG tablet Take 50 mg by mouth every 6 (six) hours as needed.   Yes Historical Provider, MD  metoprolol tartrate (LOPRESSOR) 25 MG tablet Take 0.5 tablets (12.5 mg total) by mouth 2 (two) times daily. 04/16/15  Yes Duanne Limerick, MD  omeprazole (PRILOSEC) 20 MG capsule Take 40 mg by mouth daily. Reported on 04/10/2015   Yes Historical Provider, MD  phenazopyridine (PYRIDIUM) 95 MG tablet Take 95 mg by mouth 3 (three) times daily as needed for pain.   Yes Historical Provider, MD  simvastatin (ZOCOR) 40 MG tablet Take 1 tablet (40 mg total) by mouth daily. 04/16/15  Yes Duanne Limerick, MD  sulfamethoxazole-trimethoprim (BACTRIM,SEPTRA) 400-80 MG tablet Take 1 tablet by mouth 2 (two) times daily.   Yes Historical Provider, MD  ciprofloxacin (CIPRO) 500 MG tablet Take 1 tablet (500 mg total) by mouth every 12 (twelve) hours. Patient not taking: Reported on 04/10/2015 04/09/15   Payton Mccallum, MD  esomeprazole (NEXIUM) 20 MG capsule Take 40 mg by mouth daily at 12 noon. Reported on  04/10/2015    Historical Provider, MD  ondansetron (ZOFRAN ODT) 8 MG disintegrating tablet Take 1 tablet (8 mg total) by mouth 2 (two) times daily. Patient not taking: Reported on 04/07/2015 04/05/15   Payton Mccallum, MD  tiZANidine (ZANAFLEX) 2 MG tablet Take by mouth. Reported on 04/10/2015 04/09/14   Historical Provider, MD    Allergies as of 04/09/2015 - Review Complete 04/09/2015  Allergen Reaction Noted  . Oxycodone-acetaminophen Itching 12/26/2014  . Fenofibrate  07/16/2014  . Sertraline Other (See Comments) 12/26/2014    Family History  Problem Relation Age of Onset  . Atrial fibrillation Mother   . Atrial fibrillation Father   . Hypertension Mother   . Diabetes Mother     Social History   Social History  . Marital Status: Married    Spouse Name: N/A  . Number of Children: N/A  . Years of Education: N/A   Occupational History  . Not on file.   Social History Main Topics  . Smoking status: Never Smoker   . Smokeless tobacco: Never Used  . Alcohol Use: No  . Drug Use: No  . Sexual Activity: Not on file   Other Topics Concern  . Not on file   Social History Narrative    Review of Systems: See HPI, otherwise negative ROS  Physical Exam: Ht  (1.803 m)  Wt 215  lb (97.523 kg)  BMI 30.00 kg/m2  LMP 03/17/2015 General:   Alert,  pleasant and cooperative in NAD Head:  Normocephalic and atraumatic. Neck:  Supple; no masses or thyromegaly. Lungs:  Clear throughout to auscultation.    Heart:  Regular rate and rhythm. Abdomen:  Soft, nontender and nondistended. Normal bowel sounds, without guarding, and without rebound.   Neurologic:  Alert and  oriented x4;  grossly normal neurologically.  Impression/Plan: Sherri Matthews is here for an endoscopy to be performed for RUQ and LUQ pain  Risks, benefits, limitations, and alternatives regarding  endoscopy have been reviewed with the patient.  Questions have been answered.  All parties agreeable.   Darlina Rumpf, MD  04/18/2015, 10:26 AM

## 2015-04-18 NOTE — Op Note (Signed)
[facility] Patient Name: Sherri Matthews Procedure Date: 04/18/2015 11:48 AM MRN: [id number] Account #: [id number] Date of Birth: 19-Jul-1965 Admit Type: Outpatient Age: 50 Room: [facility] OR ROOM 01 Gender: Female Note Status: Finalized Procedure:            Upper GI endoscopy Indications:          Abdominal pain in the right upper quadrant, Abdominal                        pain in the left upper quadrant Providers:            Midge Minium, MD Referring MD:         Duanne Limerick, MD (Referring MD) Medicines:            Propofol per Anesthesia Complications:        No immediate complications. Procedure:            Pre-Anesthesia Assessment:                       - Prior to the procedure, a History and Physical was                        performed, and patient medications and allergies were                        reviewed. The patient's tolerance of previous                        anesthesia was also reviewed. The risks and benefits of                        the procedure and the sedation options and risks were                        discussed with the patient. All questions were                        answered, and informed consent was obtained. Prior                        Anticoagulants: The patient has taken no previous                        anticoagulant or antiplatelet agents. ASA Grade                        Assessment: II - A patient with mild systemic disease.                        After reviewing the risks and benefits, the patient was                        deemed in satisfactory condition to undergo the                        procedure.                       After obtaining informed consent, the endoscope was  passed under direct vision. Throughout the procedure,                        the patient's blood pressure, pulse, and oxygen                        saturations were monitored continuously. The Olympus            GIF H180J endoscope (S#: [id number]) was introduced                        through the mouth, and advanced to the second part of                        duodenum. The upper GI endoscopy was accomplished                        without difficulty. The patient tolerated the procedure                        well. Findings:      The examined esophagus was normal.      Localized minimal inflammation characterized by erythema was found in       the gastric antrum. Biopsies were taken with a cold forceps for       histology.      The examined duodenum was normal. Biopsies were taken with a cold       forceps for histology. Impression:           - Normal esophagus.                       - Gastritis. Biopsied.                       - Normal examined duodenum. Biopsied. Recommendation:       - Await pathology results. Procedure Code(s):    --- Professional ---                       [phone], Esophagogastroduodenoscopy, flexible, transoral;                        with biopsy, single or multiple Diagnosis Code(s):    --- Professional ---                       R10.11, Right upper quadrant pain                       R10.12, Left upper quadrant pain                       K29.70, Gastritis, unspecified, without bleeding CPT copyright 2016 American Medical Association. All rights reserved. The codes documented in this report are preliminary and upon coder review may  be revised to meet current compliance requirements. Midge Minium, MD 04/18/2015 12:03:17 PM This report has been signed electronically. Number of Addenda: 0 Note Initiated On: 04/18/2015 11:48 AM Total Procedure Duration: 0 hours 3 minutes 4 seconds       [facility]

## 2015-04-18 NOTE — Transfer of Care (Signed)
Immediate Anesthesia Transfer of Care Note  Patient: Sherri Matthews  Procedure(s) Performed: Procedure(s): ESOPHAGOGASTRODUODENOSCOPY (EGD) WITH PROPOFOL (N/A)  Patient Location: PACU  Anesthesia Type: MAC  Level of Consciousness: awake, alert  and patient cooperative  Airway and Oxygen Therapy: Patient Spontanous Breathing and Patient connected to supplemental oxygen  Post-op Assessment: Post-op Vital signs reviewed, Patient's Cardiovascular Status Stable, Respiratory Function Stable, Patent Airway and No signs of Nausea or vomiting  Post-op Vital Signs: Reviewed and stable  Complications: No apparent anesthesia complications

## 2015-04-18 NOTE — Anesthesia Procedure Notes (Signed)
Procedure Name: MAC Date/Time: 04/18/2015 11:53 AM Performed by: Maree Krabbe Pre-anesthesia Checklist: Patient identified, Emergency Drugs available, Suction available, Timeout performed and Patient being monitored Patient Re-evaluated:Patient Re-evaluated prior to inductionOxygen Delivery Method: Nasal cannula Placement Confirmation: positive ETCO2

## 2015-04-21 ENCOUNTER — Encounter: Payer: Self-pay | Admitting: Gastroenterology

## 2015-04-23 ENCOUNTER — Encounter: Payer: Self-pay | Admitting: Gastroenterology

## 2015-05-05 ENCOUNTER — Ambulatory Visit (INDEPENDENT_AMBULATORY_CARE_PROVIDER_SITE_OTHER): Payer: BC Managed Care – PPO | Admitting: Family Medicine

## 2015-05-05 ENCOUNTER — Encounter: Payer: Self-pay | Admitting: Family Medicine

## 2015-05-05 VITALS — BP 120/80 | HR 80 | Temp 98.4°F | Ht 71.0 in | Wt 211.0 lb

## 2015-05-05 DIAGNOSIS — J029 Acute pharyngitis, unspecified: Secondary | ICD-10-CM | POA: Diagnosis not present

## 2015-05-05 MED ORDER — AZITHROMYCIN 250 MG PO TABS: ORAL_TABLET | ORAL | Status: DC

## 2015-05-05 NOTE — Progress Notes (Signed)
Name: Sherri Matthews   MRN: [id number]    DOB: 1965/06/12   Date:05/05/2015       Progress Note  Subjective  Chief Complaint  Chief Complaint  Patient presents with  . Sore Throat    Sore Throat  This is a new problem. The current episode started yesterday. The problem has been gradually worsening. Sore throat worse side: both. There has been no fever. Associated symptoms include a hoarse voice. Pertinent negatives include no abdominal pain, congestion, coughing, diarrhea, drooling, ear discharge, ear pain, headaches, neck pain, shortness of breath, trouble swallowing or vomiting. She has had exposure to strep. She has tried acetaminophen for the symptoms. The treatment provided no relief.    No problem-specific assessment & plan notes found for this encounter.   Past Medical History  Diagnosis Date  . Hypertension   . Hyperlipidemia   . Heart murmur   . Mitral valve prolapse   . AV node dysfunction     tachycardia see Dr. Lady GaryFath  . Sleep apnea     possible but not tested  . Cervical disc disease     degenerative disk   . Palpitations     Past Surgical History  Procedure Laterality Date  . Hernia repair    . Appendectomy  2008  . Esophagogastroduodenoscopy (egd) with propofol N/A 04/18/2015    Procedure: ESOPHAGOGASTRODUODENOSCOPY (EGD) WITH PROPOFOL;  Surgeon: Midge Miniumarren Wohl, MD;  [facility];  Service: Endoscopy;  Laterality: N/A;    Family History  Problem Relation Age of Onset  . Atrial fibrillation Mother   . Atrial fibrillation Father   . Hypertension Mother   . Diabetes Mother     Social History   Social History  . Marital Status: Married    Spouse Name: N/A  . Number of Children: N/A  . Years of Education: N/A   Occupational History  . Not on file.   Social History Main Topics  . Smoking status: Never Smoker   . Smokeless tobacco: Never Used  . Alcohol Use: No  . Drug Use: No  . Sexual Activity: Not on file   Other Topics  Concern  . Not on file   Social History Narrative    Allergies  Allergen Reactions  . Oxycodone-Acetaminophen Itching  . Ciprofloxacin Nausea And Vomiting  . Fenofibrate     myalgia  . Sertraline Other (See Comments)    Pt prefers to never be on this again.       Review of Systems  Constitutional: Negative for fever, chills, weight loss and malaise/fatigue.  HENT: Positive for hoarse voice. Negative for congestion, drooling, ear discharge, ear pain, sore throat and trouble swallowing.   Eyes: Negative for blurred vision.  Respiratory: Negative for cough, sputum production, shortness of breath and wheezing.   Cardiovascular: Negative for chest pain, palpitations and leg swelling.  Gastrointestinal: Negative for heartburn, nausea, vomiting, abdominal pain, diarrhea, constipation, blood in stool and melena.  Genitourinary: Negative for dysuria, urgency, frequency and hematuria.  Musculoskeletal: Negative for myalgias, back pain, joint pain and neck pain.  Skin: Negative for rash.  Neurological: Negative for dizziness, tingling, sensory change, focal weakness and headaches.  Endo/Heme/Allergies: Negative for environmental allergies and polydipsia. Does not bruise/bleed easily.  Psychiatric/Behavioral: Negative for depression and suicidal ideas. The patient is not nervous/anxious and does not have insomnia.      Objective  Filed Vitals:   05/05/15 1059  BP: 120/80  Pulse: 80  Temp: 98.4 F (36.9 C)  TempSrc: Oral  Height:  (1.803 m)  Weight: 211 lb (95.709 kg)    Physical Exam  Constitutional: She is well-developed, well-nourished, and in no distress. No distress.  HENT:  Head: Normocephalic and atraumatic.  Right Ear: External ear normal.  Left Ear: External ear normal.  Nose: Nose normal.  Mouth/Throat: Oropharynx is clear and moist.  Eyes: Conjunctivae and EOM are normal. Pupils are equal, round, and reactive to light. Right eye exhibits no discharge. Left  eye exhibits no discharge.  Neck: Normal range of motion. Neck supple. No JVD present. No thyromegaly present.  Cardiovascular: Normal rate, regular rhythm, normal heart sounds and intact distal pulses.  Exam reveals no gallop and no friction rub.   No murmur heard. Pulmonary/Chest: Effort normal and breath sounds normal.  Abdominal: Soft. Bowel sounds are normal. She exhibits no mass. There is no tenderness. There is no guarding.  Musculoskeletal: Normal range of motion. She exhibits no edema.  Lymphadenopathy:    She has no cervical adenopathy.  Neurological: She is alert. She has normal reflexes.  Skin: Skin is warm and dry. She is not diaphoretic.  Psychiatric: Mood and affect normal.  Nursing note and vitals reviewed.     Assessment & Plan  Problem List Items Addressed This Visit    None    Visit Diagnoses    Pharyngitis    -  Primary    Relevant Medications    azithromycin (ZITHROMAX) 250 MG tablet         Dr. Hayden [facility]  05/05/2015

## 2015-05-12 ENCOUNTER — Telehealth: Payer: Self-pay | Admitting: Gastroenterology

## 2015-05-12 NOTE — Telephone Encounter (Signed)
Patient called and said the generic protonic wasn't working for her. Can you call in something else, maybe nexium. She doesn't have the name of the protonics.Please call.

## 2015-05-13 ENCOUNTER — Other Ambulatory Visit: Payer: Self-pay

## 2015-05-13 DIAGNOSIS — K2100 Gastro-esophageal reflux disease with esophagitis, without bleeding: Secondary | ICD-10-CM

## 2015-05-13 MED ORDER — OMEPRAZOLE 40 MG PO CPDR: 40.0000 mg | DELAYED_RELEASE_CAPSULE | Freq: Every day | ORAL | Status: DC

## 2015-05-15 ENCOUNTER — Encounter: Payer: Self-pay | Admitting: Family Medicine

## 2015-05-15 ENCOUNTER — Other Ambulatory Visit: Payer: BC Managed Care – PPO

## 2015-05-15 ENCOUNTER — Ambulatory Visit (INDEPENDENT_AMBULATORY_CARE_PROVIDER_SITE_OTHER): Payer: BC Managed Care – PPO | Admitting: Family Medicine

## 2015-05-15 VITALS — BP 120/70 | HR 76 | Ht 71.0 in | Wt 211.0 lb

## 2015-05-15 DIAGNOSIS — F064 Anxiety disorder due to known physiological condition: Secondary | ICD-10-CM

## 2015-05-15 DIAGNOSIS — E784 Other hyperlipidemia: Secondary | ICD-10-CM

## 2015-05-15 DIAGNOSIS — I10 Essential (primary) hypertension: Secondary | ICD-10-CM | POA: Diagnosis not present

## 2015-05-15 DIAGNOSIS — E7849 Other hyperlipidemia: Secondary | ICD-10-CM

## 2015-05-15 DIAGNOSIS — K219 Gastro-esophageal reflux disease without esophagitis: Secondary | ICD-10-CM | POA: Diagnosis not present

## 2015-05-15 DIAGNOSIS — E785 Hyperlipidemia, unspecified: Secondary | ICD-10-CM | POA: Diagnosis not present

## 2015-05-15 MED ORDER — METOPROLOL TARTRATE 25 MG PO TABS: 12.5000 mg | ORAL_TABLET | Freq: Two times a day (BID) | ORAL | Status: DC

## 2015-05-15 MED ORDER — SIMVASTATIN 40 MG PO TABS: 40.0000 mg | ORAL_TABLET | Freq: Every day | ORAL | Status: DC

## 2015-05-15 NOTE — Progress Notes (Signed)
Name: Sherri Matthews   MRN: [id number]    DOB: 10/25/1965   Date:05/15/2015       Progress Note  Subjective  Chief Complaint  Chief Complaint  Patient presents with  . Hypertension  . Hyperlipidemia  . Anxiety    Hypertension This is a chronic problem. The current episode started more than 1 year ago. The problem has been gradually improving since onset. The problem is controlled. Associated symptoms include anxiety. Pertinent negatives include no blurred vision, chest pain, headaches, malaise/fatigue, neck pain, orthopnea, palpitations, peripheral edema, PND, shortness of breath or sweats. There are no associated agents to hypertension. Risk factors for coronary artery disease include dyslipidemia, post-menopausal state and stress. Past treatments include beta blockers. The current treatment provides moderate improvement. There are no compliance problems.  There is no history of angina, kidney disease, CAD/MI, CVA, heart failure, left ventricular hypertrophy, PVD, renovascular disease or retinopathy. There is no history of chronic renal disease or a hypertension causing med.  Hyperlipidemia This is a chronic problem. The current episode started more than 1 year ago. The problem is controlled. Recent lipid tests were reviewed and are normal. She has no history of chronic renal disease, diabetes, hypothyroidism, liver disease, obesity or nephrotic syndrome. There are no known factors aggravating her hyperlipidemia. Pertinent negatives include no chest pain, focal sensory loss, focal weakness, leg pain, myalgias or shortness of breath. Current antihyperlipidemic treatment includes statins. The current treatment provides mild improvement of lipids. There are no compliance problems.  There are no known risk factors for coronary artery disease.  Anxiety Presents for follow-up visit. Symptoms include excessive worry, irritability and nervous/anxious behavior. Patient reports no chest pain, decreased  concentration, depressed mood, dizziness, feeling of choking, impotence, insomnia, malaise, muscle tension, nausea, obsessions, palpitations, panic, restlessness, shortness of breath or suicidal ideas.   Past treatments include nothing. The treatment provided mild relief. Compliance with prior treatments has been good.    No problem-specific assessment & plan notes found for this encounter.   Past Medical History  Diagnosis Date  . Hypertension   . Hyperlipidemia   . Heart murmur   . Mitral valve prolapse   . AV node dysfunction     tachycardia see Dr. Lady Gary  . Sleep apnea     possible but not tested  . Cervical disc disease     degenerative disk   . Palpitations     Past Surgical History  Procedure Laterality Date  . Hernia repair    . Appendectomy  2008  . Esophagogastroduodenoscopy (egd) with propofol N/A 04/18/2015    Procedure: ESOPHAGOGASTRODUODENOSCOPY (EGD) WITH PROPOFOL;  Surgeon: Midge Minium, MD;  Location: [facility];  Service: Endoscopy;  Laterality: N/A;    Family History  Problem Relation Age of Onset  . Atrial fibrillation Mother   . Atrial fibrillation Father   . Hypertension Mother   . Diabetes Mother     Social History   Social History  . Marital Status: Married    Spouse Name: N/A  . Number of Children: N/A  . Years of Education: N/A   Occupational History  . Not on file.   Social History Main Topics  . Smoking status: Never Smoker   . Smokeless tobacco: Never Used  . Alcohol Use: No  . Drug Use: No  . Sexual Activity: Not on file   Other Topics Concern  . Not on file   Social History Narrative    Allergies  Allergen Reactions  . Oxycodone-Acetaminophen  Itching  . Ciprofloxacin Nausea And Vomiting  . Fenofibrate     myalgia  . Sertraline Other (See Comments)    Pt prefers to never be on this again.       Review of Systems  Constitutional: Positive for irritability. Negative for fever, chills, weight loss and  malaise/fatigue.  HENT: Negative for ear discharge, ear pain and sore throat.   Eyes: Negative for blurred vision.  Respiratory: Negative for cough, sputum production, shortness of breath and wheezing.   Cardiovascular: Negative for chest pain, palpitations, orthopnea, leg swelling and PND.  Gastrointestinal: Negative for heartburn, nausea, abdominal pain, diarrhea, constipation, blood in stool and melena.  Genitourinary: Negative for dysuria, urgency, frequency, hematuria and impotence.  Musculoskeletal: Negative for myalgias, back pain, joint pain and neck pain.  Skin: Negative for rash.  Neurological: Negative for dizziness, tingling, sensory change, focal weakness and headaches.  Endo/Heme/Allergies: Negative for environmental allergies and polydipsia. Does not bruise/bleed easily.  Psychiatric/Behavioral: Negative for depression, suicidal ideas and decreased concentration. The patient is nervous/anxious. The patient does not have insomnia.      Objective  Filed Vitals:   05/15/15 1605  BP: 120/70  Pulse: 76  Height: 5\' 11"  (1.803 m)  Weight: 211 lb (95.709 kg)    Physical Exam  Constitutional: She is well-developed, well-nourished, and in no distress. No distress.  HENT:  Head: Normocephalic and atraumatic.  Right Ear: External ear normal.  Left Ear: External ear normal.  Nose: Nose normal.  Mouth/Throat: Oropharynx is clear and moist.  Eyes: Conjunctivae and EOM are normal. Pupils are equal, round, and reactive to light. Right eye exhibits no discharge. Left eye exhibits no discharge.  Neck: Normal range of motion. Neck supple. No JVD present. No thyromegaly present.  Cardiovascular: Normal rate, regular rhythm, normal heart sounds and intact distal pulses.  Exam reveals no gallop and no friction rub.   No murmur heard. Pulmonary/Chest: Effort normal and breath sounds normal.  Abdominal: Soft. Bowel sounds are normal. She exhibits no mass. There is no tenderness. There is  no guarding.  Musculoskeletal: Normal range of motion. She exhibits no edema.  Lymphadenopathy:    She has no cervical adenopathy.  Neurological: She is alert.  Skin: Skin is warm and dry. She is not diaphoretic.  Psychiatric: Mood and affect normal.  Nursing note and vitals reviewed.     Assessment & Plan  Problem List Items Addressed This Visit      Cardiovascular and Mediastinum   Essential hypertension - Primary   Relevant Medications   simvastatin (ZOCOR) 40 MG tablet   metoprolol tartrate (LOPRESSOR) 25 MG tablet   Other Relevant Orders   Renal Function Panel   TSH   CBC With Differential     Digestive   Gastroesophageal reflux disease     Nervous and Auditory   Anxiety disorder due to known physiological condition     Other   Familial multiple lipoprotein-type hyperlipidemia   Relevant Medications   simvastatin (ZOCOR) 40 MG tablet   metoprolol tartrate (LOPRESSOR) 25 MG tablet   Hyperlipidemia   Relevant Medications   simvastatin (ZOCOR) 40 MG tablet   metoprolol tartrate (LOPRESSOR) 25 MG tablet   Other Relevant Orders   Lipid Profile   Hepatic function panel   TSH        Dr. Hayden Rasmusseneanna Jones [facility]  05/15/2015

## 2015-05-16 LAB — CBC WITH DIFFERENTIAL
BASOS ABS: 0 10*3/uL (ref 0.0–0.2)
Basos: 0 %
EOS (ABSOLUTE): 0.1 10*3/uL (ref 0.0–0.4)
Eos: 2 %
Hematocrit: 34.3 % (ref 34.0–46.6)
Hemoglobin: 11 g/dL — ABNORMAL LOW (ref 11.1–15.9)
IMMATURE GRANS (ABS): 0 10*3/uL (ref 0.0–0.1)
IMMATURE GRANULOCYTES: 0 %
LYMPHS: 33 %
Lymphocytes Absolute: 1.8 10*3/uL (ref 0.7–3.1)
MCH: 24.9 pg — AB (ref 26.6–33.0)
MCHC: 32.1 g/dL (ref 31.5–35.7)
MCV: 78 fL — ABNORMAL LOW (ref 79–97)
Monocytes Absolute: 0.4 10*3/uL (ref 0.1–0.9)
Monocytes: 8 %
NEUTROS PCT: 57 %
Neutrophils Absolute: 3.1 10*3/uL (ref 1.4–7.0)
RBC: 4.41 x10E6/uL (ref 3.77–5.28)
RDW: 15.7 % — ABNORMAL HIGH (ref 12.3–15.4)
WBC: 5.4 10*3/uL (ref 3.4–10.8)

## 2015-05-16 LAB — LIPID PANEL
CHOL/HDL RATIO: 3.7 ratio (ref 0.0–4.4)
Cholesterol, Total: 168 mg/dL (ref 100–199)
HDL: 45 mg/dL (ref >39)
LDL CALC: 96 mg/dL (ref 0–99)
TRIGLYCERIDES: 136 mg/dL (ref 0–149)
VLDL Cholesterol Cal: 27 mg/dL (ref 5–40)

## 2015-05-16 LAB — TSH: TSH: 0.695 u[IU]/mL (ref 0.450–4.500)

## 2015-05-16 LAB — RENAL FUNCTION PANEL
ALBUMIN: 3.9 g/dL (ref 3.5–5.5)
BUN/Creatinine Ratio: 18 (ref 9–23)
BUN: 10 mg/dL (ref 6–24)
CHLORIDE: 101 mmol/L (ref 96–106)
CO2: 23 mmol/L (ref 18–29)
Calcium: 7.9 mg/dL — ABNORMAL LOW (ref 8.7–10.2)
Creatinine, Ser: 0.56 mg/dL — ABNORMAL LOW (ref 0.57–1.00)
GFR calc Af Amer: 124 mL/min/{1.73_m2} (ref >59)
GFR calc non Af Amer: 108 mL/min/{1.73_m2} (ref >59)
GLUCOSE: 83 mg/dL (ref 65–99)
PHOSPHORUS: 2.9 mg/dL (ref 2.5–4.5)
POTASSIUM: 3.7 mmol/L (ref 3.5–5.2)
Sodium: 141 mmol/L (ref 134–144)

## 2015-05-16 LAB — HEPATIC FUNCTION PANEL
ALT: 9 IU/L (ref 0–32)
AST: 19 IU/L (ref 0–40)
Alkaline Phosphatase: 89 IU/L (ref 39–117)
Bilirubin Total: 0.4 mg/dL (ref 0.0–1.2)
Bilirubin, Direct: 0.1 mg/dL (ref 0.00–0.40)
TOTAL PROTEIN: 6.6 g/dL (ref 6.0–8.5)

## 2015-05-29 ENCOUNTER — Encounter: Payer: Self-pay | Admitting: Emergency Medicine

## 2015-05-29 ENCOUNTER — Ambulatory Visit
Admission: EM | Admit: 2015-05-29 | Discharge: 2015-05-29 | Disposition: A | Discharge status: Home / Self Care | Payer: BC Managed Care – PPO | Attending: Family Medicine | Admitting: Family Medicine

## 2015-05-29 ENCOUNTER — Ambulatory Visit (INDEPENDENT_AMBULATORY_CARE_PROVIDER_SITE_OTHER): Payer: BC Managed Care – PPO

## 2015-05-29 DIAGNOSIS — M4317 Spondylolisthesis, lumbosacral region: Secondary | ICD-10-CM

## 2015-05-29 DIAGNOSIS — M5137 Other intervertebral disc degeneration, lumbosacral region: Secondary | ICD-10-CM | POA: Diagnosis not present

## 2015-05-29 LAB — URINALYSIS COMPLETE WITH MICROSCOPIC (ARMC ONLY)
Bacteria, UA: NONE SEEN
Bilirubin Urine: NEGATIVE
Glucose, UA: NEGATIVE mg/dL
Leukocytes, UA: NEGATIVE
Nitrite: NEGATIVE
Specific Gravity, Urine: 1.025 (ref 1.005–1.030)
WBC, UA: NONE SEEN WBC/hpf (ref 0–5)
pH: 6.5 (ref 5.0–8.0)

## 2015-05-29 MED ORDER — NAPROXEN 500 MG PO TABS: 500.0000 mg | ORAL_TABLET | Freq: Two times a day (BID) | ORAL | Status: DC

## 2015-05-29 MED ORDER — METAXALONE 800 MG PO TABS: 800.0000 mg | ORAL_TABLET | Freq: Three times a day (TID) | ORAL | Status: DC

## 2015-05-29 MED ORDER — KETOROLAC TROMETHAMINE 60 MG/2ML IM SOLN
60.0000 mg | Freq: Once | INTRAMUSCULAR | Status: AC
  Administered 2015-05-29: 60 mg via INTRAMUSCULAR

## 2015-05-29 NOTE — ED Provider Notes (Signed)
CSN: [id number]     Arrival date & time 05/29/15  0847 History   First MD Initiated Contact with Patient 05/29/15 [phone]     Chief Complaint  Patient presents with  . Back Pain   (Consider location/radiation/quality/duration/timing/severity/associated sxs/prior Treatment) HPI  This a 50 year old female who resents with left-sided nonradiating lower and mid back pain that started last Friday. She states that she was sitting in a very low chair at a kindergarten play having to get up and down several times. He does not remember any injury at the time but later that afternoon started having severe pain which has persisted and worsened since that time. Right now is an 8 out of 10. She denies any incontinence. [place] any radicular symptoms. Has noticed some constipation which is unusual for her. She been using ibuprofen and Tylenol but has not been very effective. Sates that she has a history of spondylolisthesis was diagnosed many years ago along with degenerative disc disease of her lumbar spine. Her last x-ray was many years ago according to her.      Past Medical History  Diagnosis Date  . Hypertension   . Hyperlipidemia   . Heart murmur   . Mitral valve prolapse   . AV node dysfunction     tachycardia see Dr. Lady Gary  . Sleep apnea     possible but not tested  . Cervical disc disease     degenerative disk   . Palpitations    Past Surgical History  Procedure Laterality Date  . Hernia repair    . Appendectomy  2008  . Esophagogastroduodenoscopy (egd) with propofol N/A 04/18/2015    Procedure: ESOPHAGOGASTRODUODENOSCOPY (EGD) WITH PROPOFOL;  Surgeon: Midge Minium, MD;  Location: [facility];  Service: Endoscopy;  Laterality: N/A;   Family History  Problem Relation Age of Onset  . Atrial fibrillation Mother   . Atrial fibrillation Father   . Hypertension Mother   . Diabetes Mother    Social History  Substance Use Topics  . Smoking status: Never Smoker   . Smokeless tobacco:  Never Used  . Alcohol Use: No   OB History    No data available     Review of Systems  Constitutional: Positive for activity change. Negative for fever, chills and fatigue.  Musculoskeletal: Positive for back pain.  All other systems reviewed and are negative.   Allergies  Oxycodone-acetaminophen; Ciprofloxacin; Fenofibrate; and Sertraline  Home Medications   Prior to Admission medications   Medication Sig Start Date End Date Taking? Authorizing Provider  ALPRAZolam Prudy Feeler) 0.5 MG tablet Take 0.5 tablets by mouth daily as needed.  09/26/13   Historical Provider, MD  metaxalone (SKELAXIN) 800 MG tablet Take 1 tablet (800 mg total) by mouth 3 (three) times daily. 05/29/15   Lutricia Feil, PA-C  metoprolol tartrate (LOPRESSOR) 25 MG tablet Take 0.5 tablets (12.5 mg total) by mouth 2 (two) times daily. 05/15/15   Duanne Limerick, MD  naproxen (NAPROSYN) 500 MG tablet Take 1 tablet (500 mg total) by mouth 2 (two) times daily with a meal. 05/29/15   Lutricia Feil, PA-C  omeprazole (PRILOSEC) 40 MG capsule Take 1 capsule (40 mg total) by mouth daily. Patient taking differently: Take 40 mg by mouth daily. Dr Servando Snare 05/13/15   Midge Minium, MD  simvastatin (ZOCOR) 40 MG tablet Take 1 tablet (40 mg total) by mouth daily. 05/15/15   Duanne Limerick, MD   Meds Ordered and Administered this Visit   Medications  ketorolac (TORADOL) injection 60 mg (60 mg Intramuscular Given 05/29/15 1016)    BP 150/72 mmHg  Pulse 68  Temp(Src) 97.6 F (36.4 C) (Tympanic)  Resp 16  Ht 5\' 11"  (1.803 m)  Wt 205 lb (92.987 kg)  BMI 28.60 kg/m2  SpO2 100%  LMP 04/17/2015 No data found.   Physical Exam  Constitutional: She is oriented to person, place, and time. She appears well-developed and well-nourished. No distress.  HENT:  Head: Normocephalic and atraumatic.  Eyes: Conjunctivae are normal. Pupils are equal, round, and reactive to light.  Neck: Normal range of motion. Neck supple.  Pulmonary/Chest: Effort  normal and breath sounds normal.  Musculoskeletal: She exhibits tenderness.  Examination of lumbar spine shows a level pelvis in stance. The patient has decided rightward list with forward flexion which is very minimal due to back pain on the left. Lateral flexion is though slightly limited with more discomfort in the left lumbar area with left-sided flexion. Patient is able to toe and heel walk adequately. DTRs at the knee and ankle are 2+ over 4 and symmetrical. There is no clonus present. EHL ,peroneal and anterior tibialis are strong to clinical testing. Sensation is intact to light touch distally. She has a negative straight leg raise testing in the sitting position to 90 bilaterally.  Neurological: She is alert and oriented to person, place, and time.  Skin: Skin is warm and dry. She is not diaphoretic.  Psychiatric: She has a normal mood and affect. Her behavior is normal. Judgment and thought content normal.  Nursing note and vitals reviewed.   ED Course  Procedures (including critical care time)  Labs Review Labs Reviewed  URINALYSIS COMPLETEWITH MICROSCOPIC ([facility] ONLY) - Abnormal; Notable for the following:    Ketones, ur TRACE (*)    Hgb urine dipstick TRACE (*)    Protein, ur TRACE (*)    Squamous Epithelial / LPF 0-5 (*)    All other components within normal limits    Imaging Review Dg Lumbar Spine Complete  05/29/2015  CLINICAL DATA:  Low back pain on the left for 1 week, no radiation EXAM: LUMBAR SPINE - COMPLETE 4+ VIEW COMPARISON:  CT abdomen pelvis of 03/07/2014 FINDINGS: There is anterolisthesis of L5 on S1 by 9 mm compared to approximately 7 mm on the sagittal CT image from 2016. There is degenerative disc disease at L5-S1 with loss of disc space and sclerosis with spurring and vacuum disc phenomenon. Pars defects were better visualized on CT at the L5 level. The remainder of intervertebral disc spaces appear normal. No compression deformity is seen. Surgical sutures are  noted in the right lower quadrant. The bowel gas pattern is nonspecific. The SI joints are corticated. IMPRESSION: 1. 9 mm anterolisthesis of L5 on S1 with bilateral pars defects at L5. 2. Degenerative disc disease at L5-S1. Electronically Signed   By: Dwyane DeePaul  Barry M.D.   On: 05/29/2015 11:05     Visual Acuity Review  Right Eye Distance:   Left Eye Distance:   Bilateral Distance:    Right Eye Near:   Left Eye Near:    Bilateral Near:      Medications  ketorolac (TORADOL) injection 60 mg (60 mg Intramuscular Given 05/29/15 1016)    MDM   1. Spondylolisthesis at L5-S1 level   2. DDD (degenerative disc disease), lumbosacral    New Prescriptions   METAXALONE (SKELAXIN) 800 MG TABLET    Take 1 tablet (800 mg total) by mouth 3 (three) times  daily.   NAPROXEN (NAPROSYN) 500 MG TABLET    Take 1 tablet (500 mg total) by mouth 2 (two) times daily with a meal.  Plan: 1. Test/x-ray results and diagnosis reviewed with patient 2. rx as per orders; risks, benefits, potential side effects reviewed with patient 3. Recommend supportive treatment with Rest and symptom avoidance. I recommended that she be seen by a spinal surgeon to follow the progress of the spondylolisthesis. I provided her with information regarding the spondylolisthesis and exercises that she may work on to strengthen her core after her initial pain and symptoms have subsided. She should follow-up with Dr. Yetta Barre her primary care physician. 4. F/u prn if symptoms worsen or don't improve    Lutricia Feil, PA-C 05/29/15 1127

## 2015-05-29 NOTE — ED Notes (Signed)
Pt reports left lower and mid back pain that started last Friday denies known injury or urinary concerns. Pt had similar symptoms back in 2016 unsure what is causing pain but has worsened over this last week

## 2015-05-29 NOTE — Discharge Instructions (Signed)
Degenerative Disk Disease Degenerative disk disease is a condition caused by the changes that occur in spinal disks as you grow older. Spinal disks are soft and compressible disks located between the bones of your spine (vertebrae). These disks act like shock absorbers. Degenerative disk disease can affect the whole spine. However, the neck and lower back are most commonly affected. Many changes can occur in the spinal disks with aging, such as:  The spinal disks may dry and shrink.  Small tears may occur in the tough, outer covering of the disk (annulus).  The disk space may become smaller due to loss of water.  Abnormal growths in the bone (spurs) may occur. This can put pressure on the nerve roots exiting the spinal canal, causing pain.  The spinal canal may become narrowed. RISK FACTORS   Being overweight.  Having a family history of degenerative disk disease.  Smoking.  There is increased risk if you are doing heavy lifting or have a sudden injury. SIGNS AND SYMPTOMS  Symptoms vary from person to person and may include:  Pain that varies in intensity. Some people have no pain, while others have severe pain. The location of the pain depends on the part of your backbone that is affected.  You will have neck or arm pain if a disk in the neck area is affected.  You will have pain in your back, buttocks, or legs if a disk in the lower back is affected.  Pain that becomes worse while bending, reaching up, or with twisting movements.  Pain that may start gradually and then get worse as time passes. It may also start after a major or minor injury.  Numbness or tingling in the arms or legs. DIAGNOSIS  Your health care provider will ask you about your symptoms and about activities or habits that may cause the pain. He or she may also ask about any injuries, diseases, or treatments you have had. Your health care provider will examine you to check for the range of movement that is  possible in the affected area, to check for strength in your extremities, and to check for sensation in the areas of the arms and legs supplied by different nerve roots. You may also have:   An X-ray of the spine.  Other imaging tests, such as MRI. TREATMENT  Your health care provider will advise you on the best plan for treatment. Treatment may include:  Medicines.  Rehabilitation exercises. HOME CARE INSTRUCTIONS   Follow proper lifting and walking techniques as advised by your health care provider.  Maintain good posture.  Exercise regularly as advised by your health care provider.  Perform relaxation exercises.  Change your sitting, standing, and sleeping habits as advised by your health care provider.  Change positions frequently.  Lose weight or maintain a healthy weight as advised by your health care provider.  Do not use any tobacco products, including cigarettes, chewing tobacco, or electronic cigarettes. If you need help quitting, ask your health care provider.  Wear supportive footwear.  Take medicines only as directed by your health care provider. SEEK MEDICAL CARE IF:   Your pain does not go away within 1-4 weeks.  You have significant appetite or weight loss. SEEK IMMEDIATE MEDICAL CARE IF:   Your pain is severe.  You notice weakness in your arms, hands, or legs.  You begin to lose control of your bladder or bowel movements.  You have fevers or night sweats. MAKE SURE YOU:   Understand these  instructions.  Will watch your condition.  Will get help right away if you are not doing well or get worse.   This information is not intended to replace advice given to you by your health care provider. Make sure you discuss any questions you have with your health care provider.   Document Released: 12/06/2006 Document Revised: 03/01/2014 Document Reviewed: 06/12/2013 Elsevier Interactive Patient Education 2016 [employer]  Spondylolisthesis With  Rehab The slipping of one or multiple vertebrae out of the correct anatomical position is a condition known as spondylolisthesis. Spondylolisthesis is most common in adolescents and is caused by a number of different reasons, such as vertebral fracture or something you are born with (congenital). Spondylolisthesis is diagnosed with the use of X-rays. SYMPTOMS   Dull, achy pain in the lower back.  Pain that worsens with extension of the spine.  Tightness of the muscles on the back of the thigh.  Lower back stiffness.  Signs of nerve damage: pain, numbness, or weakness affecting one or both lower extremities.  Muscle wasting (atrophy), uncommon.  Loss of stool (bowel) or urine (bladder) function. CAUSES  The symptoms of spondylolisthesis are caused by one or more vertebrae that are out of alignment, placing pressure on the spinal cord. Common mechanisms of injury include:  Congenital defect of the spine.  Degenerative process.  Stress fracture of the spine.  Fracture due to trauma to the spine. RISK INCREASES WITH:  Activities that have a risk of hyperextending the back.  Activities that have a risk of excessively rotating the spine.  Poor strength and flexibility.  Failure to warm up properly before activity.  Family history of spondylolysis or spondylolisthesis.  Improper sports technique. PREVENTION  Warm up and stretch properly before activity.  Allow for adequate recovery between workouts.  Maintain physical fitness:  Strength, flexibility, and endurance.  Cardiovascular fitness.  Learn and use proper technique. When possible, have a coach correct improper technique. PROGNOSIS  If treated properly, the spondylolisthesis usually resolves. RELATED COMPLICATIONS   Recurrent symptoms that result in a chronic problem.  Inability to compete in athletics.  Prolonged healing time, if improperly treated or reinjured.  Failure of the fracture to heal  (nonunion).  Healing of the fracture in a poor position (malunion). TREATMENT Treatment initially involves resting from any activities that aggravate the symptoms and the use of ice and medications to help reduce pain and inflammation. The use of strengthening and stretching exercises may help reduce pain with activity. These exercises may be performed at home or with referral to a therapist. It is important to learn how to use proper body mechanics as to not place undue stress on your spine. If the injury is severe, then your caregiver may recommend a back brace to allow for healing, or even surgery. Surgery often involves fusing two adjacent vertebrae so no movement is allowed between them.  MEDICATION   If pain medication is necessary, then nonsteroidal anti-inflammatory medications, such as aspirin and ibuprofen, or other minor pain relievers, such as acetaminophen, are often recommended.  Do not take pain medication for 7 days before surgery.  Prescription pain relievers may be given if deemed necessary by your caregiver. Use only as directed and only as much as you need. HEAT AND COLD  Cold treatment (icing) relieves pain and reduces inflammation. Cold treatment should be applied for 10 to 15 minutes every 2 to 3 hours for inflammation and pain and immediately after any activity that aggravates your symptoms. Use ice packs  or massage the area with a piece of ice (ice massage).  Heat treatment may be used prior to performing the stretching and strengthening activities prescribed by your caregiver, physical therapist, or athletic trainer. Use a heat pack or soak the injury in warm water. SEEK MEDICAL CARE IF:  Treatment seems to offer no benefit, or the condition worsens.  Any medications produce adverse side effects.  Any complications from surgery occur:  Pain, numbness, or coldness in the extremity operated upon.  Discoloration of the nail beds (they become blue or gray) of the  extremity operated upon.  Signs of infections (fever, pain, inflammation, redness, or persistent bleeding). EXERCISES RANGE OF MOTION (ROM) AND STRETCHING EXERCISES - Spondylolisthesis Most people with low back pain will find that their symptoms worsen with either excessive bending forward (flexion) or arching at the low back (extension). The exercises which will help resolve your symptoms will focus on the opposite motion. Your physician, physical therapist or athletic trainer will help you determine which exercises will be most helpful to resolve your low back pain. Do not complete any exercises without first consulting with your clinician. Discontinue any exercises which worsen your symptoms until you speak to your clinician. If you have pain, numbness or tingling which travels down into your buttocks, leg, or foot, the goal of the therapy is for these symptoms to move closer to your back and eventually resolve. Occasionally, these leg symptoms will get better, but your low back pain may worsen; this is typically an indication of progress in your rehabilitation. Be certain to be very alert to any changes in your symptoms and the activities in which you participated in the 24 hours prior to the change. Sharing this information with your clinician will allow him/her to most efficiently treat your condition. These exercises may help you when beginning to rehabilitate your injury. Your symptoms may resolve with or without further involvement from your physician, physical therapist or athletic trainer. While completing these exercises, remember:   Restoring tissue flexibility helps normal motion to return to the joints. This allows healthier, less painful movement and activity.  An effective stretch should be held for at least 30 seconds.  A stretch should never be painful. You should only feel a gentle lengthening or release in the stretched tissue. FLEXION RANGE OF MOTION AND STRETCHING  EXERCISES: STRETCH - Flexion, Single Knee to Chest  Lie on a firm bed or floor with both legs extended in front of you.  Keeping one leg in contact with the floor, bring your opposite knee to your chest. Hold your leg in place by either grabbing behind your thigh or at your knee.  Pull until you feel a gentle stretch in your low back. Hold __________ seconds. Slowly release your grasp and repeat the exercise with the opposite side. Repeat __________ times. Complete this exercise __________ times per day.  STRETCH - Flexion, Double Knee to Chest  Lie on a firm bed or floor with both legs extended in front of you.  Keeping one leg in contact with the floor, bring your opposite knee to your chest.  Tense your stomach muscles to support your back and then lift your other knee to your chest. Hold your legs in place by either grabbing behind your thighs or at your knees.  Pull both knees toward your chest until you feel a gentle stretch in your low back. Hold __________ seconds.  Tense your stomach muscles and slowly return one leg at a time to the  floor. Repeat __________ times. Complete this exercise __________ times per day.  STRENGTHENING EXERCISES - Spondylolisthesis These exercises may help you when beginning to rehabilitate your injury. These exercises should be done near your "sweet spot." This is the neutral, low-back arch, somewhere between fully rounded and fully arched, that is your least painful position. When performed in this safe range of motion, these exercises can be used for people who have either a flexion or extension based injury. These exercises may resolve your symptoms with or without further involvement from your physician, physical therapist or athletic trainer. While completing these exercises, remember:   Muscles can gain both the endurance and the strength needed for everyday activities through controlled exercises.  Complete these exercises as instructed by your  physician, physical therapist or athletic trainer. Progress the resistance and repetitions only as guided.  You may experience muscle soreness or fatigue, but the pain or discomfort you are trying to eliminate should never worsen during these exercises. If this pain does worsen, stop and make certain you are following the directions exactly. If the pain is still present after adjustments, discontinue the exercise until you can discuss the trouble with your clinician. STRENGTHENING - Deep Abdominals, Pelvic Tilt   Lie on a firm bed or floor. Keeping your legs in front of you, bend your knees so they are both pointed toward the ceiling and your feet are flat on the floor.  Tense your lower abdominal muscles to press your low back into the floor. This motion will rotate your pelvis so that your tail bone is scooping upwards rather than pointing at your feet or into the floor.  With a gentle tension and even breathing, hold this position for __________ seconds. Repeat __________ times. Complete this exercise __________ times per day.  STRENGTHENING - Abdominals, Crunches   Lie on a firm bed or floor. Keeping your legs in front of you, bend your knees so they are both pointed toward the ceiling and your feet are flat on the floor. Cross your arms over your chest.  Slightly tip your chin down without bending your neck.  Tense your abdominals and slowly lift your trunk high enough to just clear your shoulder blades. Lifting higher can put excessive stress on the low back and does not further strengthen your abdominal muscles.  Control your return to the starting position. Repeat __________ times. Complete this exercise __________ times per day.  STRENGTHENING - Quadruped, Opposite UE/LE Lift   Assume a hands and knees position on a firm surface. Keep your hands under your shoulders and your knees under your hips. You may place padding under your knees for comfort.  Find your neutral spine and  gently tense your abdominal muscles so that you can maintain this position. Your shoulders and hips should form a rectangle that is parallel with the floor and is not twisted.  Keeping your trunk steady, lift your right hand no higher than your shoulder and then your left leg no higher than your hip. Make sure you are not holding your breath. Hold this position __________ seconds.  Continuing to keep your abdominal muscles tense and your back steady, slowly return to your starting position. Repeat with the opposite arm and leg. Repeat __________ times. Complete this exercise __________ times per day.  STRENGTHENING - Lower Abdominals, Double Knee Lift  Lie on a firm bed or floor. Keeping your legs in front of you, bend your knees so they are both pointed toward the ceiling and your  feet are flat on the floor.  Tense your abdominal muscles to brace your low back and slowly lift both of your knees until they come over your hips. Be certain not to hold your breath.  Hold __________ seconds. Using your abdominal muscles, return to the starting position in a slow and controlled manner. Repeat __________ times. Complete this exercise __________ times per day.  POSTURE AND BODY MECHANICS CONSIDERATIONS - Spondylolisthesis Keeping correct posture when sitting, standing or completing your activities will reduce the stress put on different body tissues, allowing injured tissues a chance to heal and limiting painful experiences. The following are general guidelines for improved posture. Your physician or physical therapist will provide you with any instructions specific to your needs. While reading these guidelines, remember:  The exercises prescribed by your provider will help you have the flexibility and strength to maintain correct postures.  The correct posture provides the optimal environment for your joints to work. All of your joints have less wear and tear when properly supported by a spine with good  posture. This means you will experience a healthier, less painful body.  Correct posture must be practiced with all of your activities, especially prolonged sitting and standing. Correct posture is as important when doing repetitive low-stress activities (typing) as it is when doing a single heavy-load activity (lifting). PROPER SITTING POSTURE In order to minimize stress and discomfort on your spine, you must sit with correct posture. Sitting with good posture should be effortless for a healthy body. Returning to good posture is a gradual process. Many people can work toward this most comfortably by using various supports until they have the flexibility and strength to maintain this posture on their own. When sitting with proper posture, your ears will fall over your shoulders and your shoulders will fall over your hips. You should use the back of the chair to support your upper back. Your low back will be in a neutral position, just slightly arched. You may place a small pillow or folded towel at the base of your low back for  support.  When working at a desk, create an environment that supports good, upright posture. Without extra support, muscles fatigue and lead to excessive strain on joints and other tissues. Keep these recommendations in mind: CHAIR:  A chair should be able to slide under your desk when your back makes contact with the back of the chair. This allows you to work closely.  The chair's height should allow your eyes to be level with the upper part of your monitor and your hands to be slightly lower than your elbows. BODY POSITION  Your feet should make contact with the floor. If this is not possible, use a foot rest.  Keep your ears over your shoulders. This will reduce stress on your neck and low back. INCORRECT SITTING POSTURES If you are feeling tired and unable to assume a healthy sitting posture, do not slouch or slump. This puts excessive strain on your back tissues,  causing more damage and pain. Healthier options include:  Using more support, like a lumbar pillow.  Switching tasks to something that requires you to be upright or walking.  Taking a brief walk.  Lying down to rest in a neutral-spine position.  CORRECT LIFTING TECHNIQUES DO:   Assume a wide stance. This will provide you more stability and the opportunity to get as close as possible to the object which you are lifting.  Tense your abdominals to brace your spine; then  bend at the knees and hips. Keeping your back locked in a neutral-spine position, lift using your leg muscles. Lift with your legs, keeping your back straight.  Test the weight of unknown objects before attempting to lift them.  Try to keep your elbows locked down at your sides in order get the best strength from your shoulders when carrying an object.  Always ask for help when lifting heavy or awkward objects. INCORRECT LIFTING TECHNIQUES DO NOT:   Lock your knees when lifting, even if it is a small object.  Bend and twist. Pivot at your feet or move your feet when needing to change directions.  Assume that you cannot safely pick up a paperclip without proper posture.   This information is not intended to replace advice given to you by your health care provider. Make sure you discuss any questions you have with your health care provider.   Document Released: 02/08/2005 Document Revised: 10/30/2014 Document Reviewed: 05/23/2008 Elsevier Interactive Patient Education [employer]

## 2015-06-04 DIAGNOSIS — M545 Low back pain, unspecified: Secondary | ICD-10-CM | POA: Insufficient documentation

## 2015-06-04 DIAGNOSIS — K5792 Diverticulitis of intestine, part unspecified, without perforation or abscess without bleeding: Secondary | ICD-10-CM | POA: Insufficient documentation

## 2015-06-04 DIAGNOSIS — I471 Supraventricular tachycardia: Secondary | ICD-10-CM | POA: Insufficient documentation

## 2015-06-05 ENCOUNTER — Ambulatory Visit: Payer: Self-pay | Admitting: Family Medicine

## 2015-06-09 ENCOUNTER — Ambulatory Visit (INDEPENDENT_AMBULATORY_CARE_PROVIDER_SITE_OTHER): Payer: BC Managed Care – PPO | Admitting: Family Medicine

## 2015-06-09 ENCOUNTER — Encounter: Payer: Self-pay | Admitting: Family Medicine

## 2015-06-09 VITALS — BP 160/90 | HR 80 | Ht 71.0 in | Wt 208.0 lb

## 2015-06-09 DIAGNOSIS — I10 Essential (primary) hypertension: Secondary | ICD-10-CM

## 2015-06-09 MED ORDER — HYDROCHLOROTHIAZIDE 12.5 MG PO TABS
12.5000 mg | ORAL_TABLET | Freq: Every day | ORAL | Status: DC
Start: 2015-06-09 — End: 2015-07-07

## 2015-06-09 NOTE — Progress Notes (Signed)
Name: Sherri GoldsLisa Wilborn Matthews   MRN: [id number]    DOB: [birth year]   Date:06/09/2015       Progress Note  Subjective  Chief Complaint  Chief Complaint  Patient presents with  . Follow-up    hospitalization- they want a recheck on B/P    HPI  No problem-specific assessment & plan notes found for this encounter.   Past Medical History  Diagnosis Date  . Hypertension   . Hyperlipidemia   . Heart murmur   . Mitral valve prolapse   . AV node dysfunction     tachycardia see Dr. Lady GaryFath  . Sleep apnea     possible but not tested  . Cervical disc disease     degenerative disk   . Palpitations     Past Surgical History  Procedure Laterality Date  . Hernia repair    . Appendectomy  2008  . Esophagogastroduodenoscopy (egd) with propofol N/A 04/18/2015    Procedure: ESOPHAGOGASTRODUODENOSCOPY (EGD) WITH PROPOFOL;  Surgeon: Midge Miniumarren Wohl, MD;  [facility];  Service: Endoscopy;  Laterality: N/A;    Family History  Problem Relation Age of Onset  . Atrial fibrillation Mother   . Atrial fibrillation Father   . Hypertension Mother   . Diabetes Mother     Social History   Social History  . Marital Status: Married    Spouse Name: N/A  . Number of Children: N/A  . Years of Education: N/A   Occupational History  . Not on file.   Social History Main Topics  . Smoking status: Never Smoker   . Smokeless tobacco: Never Used  . Alcohol Use: No  . Drug Use: No  . Sexual Activity: Not on file   Other Topics Concern  . Not on file   Social History Narrative    Allergies  Allergen Reactions  . Oxycodone-Acetaminophen Itching  . Ciprofloxacin Nausea And Vomiting  . Fenofibrate     myalgia  . Sertraline Other (See Comments)    Pt prefers to never be on this again.       Review of Systems  Constitutional: Negative for fever, chills, weight loss and malaise/fatigue.  HENT: Negative for ear discharge, ear pain and sore throat.   Eyes: Negative for blurred  vision.  Respiratory: Negative for cough, sputum production, shortness of breath and wheezing.   Cardiovascular: Negative for chest pain, palpitations and leg swelling.  Gastrointestinal: Negative for heartburn, nausea, abdominal pain, diarrhea, constipation, blood in stool and melena.  Genitourinary: Negative for dysuria, urgency, frequency and hematuria.  Musculoskeletal: Negative for myalgias, back pain, joint pain and neck pain.  Skin: Negative for rash.  Neurological: Negative for dizziness, tingling, sensory change, focal weakness and headaches.  Endo/Heme/Allergies: Negative for environmental allergies and polydipsia. Does not bruise/bleed easily.  Psychiatric/Behavioral: Negative for depression and suicidal ideas. The patient is not nervous/anxious and does not have insomnia.      Objective  Filed Vitals:   06/09/15 1406  BP: 160/90  Pulse: 80  Height: 5\' 11"  (1.803 m)  Weight: 208 lb (94.348 kg)    Physical Exam  Constitutional: She is well-developed, well-nourished, and in no distress. No distress.  HENT:  Head: Normocephalic and atraumatic.  Right Ear: External ear normal.  Left Ear: External ear normal.  Nose: Nose normal.  Mouth/Throat: Oropharynx is clear and moist.  Eyes: Conjunctivae and EOM are normal. Pupils are equal, round, and reactive to light. Right eye exhibits no discharge. Left eye exhibits no discharge.  Neck: Normal range of motion. Neck supple. No JVD present. No thyromegaly present.  Cardiovascular: Normal rate, regular rhythm, normal heart sounds and intact distal pulses.  Exam reveals no gallop and no friction rub.   No murmur heard. Pulmonary/Chest: Effort normal and breath sounds normal.  Abdominal: Soft. Bowel sounds are normal. She exhibits no mass. There is no tenderness. There is no guarding.  Musculoskeletal: Normal range of motion. She exhibits no edema.  Lymphadenopathy:    She has no cervical adenopathy.  Neurological: She is alert.  She has normal reflexes.  Skin: Skin is warm and dry. She is not diaphoretic.  Psychiatric: Mood and affect normal.      Assessment & Plan  Problem List Items Addressed This Visit      Cardiovascular and Mediastinum   Essential (primary) hypertension - Primary   Relevant Medications   hydrochlorothiazide (HYDRODIURIL) 12.5 MG tablet        Dr. Hayden [facility]  06/09/2015

## 2015-06-13 ENCOUNTER — Other Ambulatory Visit: Payer: Self-pay | Admitting: Family Medicine

## 2015-07-07 ENCOUNTER — Encounter: Payer: Self-pay | Admitting: Family Medicine

## 2015-07-07 ENCOUNTER — Ambulatory Visit (INDEPENDENT_AMBULATORY_CARE_PROVIDER_SITE_OTHER): Payer: BC Managed Care – PPO | Admitting: Family Medicine

## 2015-07-07 VITALS — BP 130/72 | HR 80 | Ht 71.0 in | Wt 223.0 lb

## 2015-07-07 DIAGNOSIS — I10 Essential (primary) hypertension: Secondary | ICD-10-CM

## 2015-07-07 MED ORDER — HYDROCHLOROTHIAZIDE 12.5 MG PO TABS: 12.5000 mg | ORAL_TABLET | Freq: Every day | ORAL | Status: DC

## 2015-07-07 MED ORDER — METOPROLOL TARTRATE 25 MG PO TABS: 12.5000 mg | ORAL_TABLET | Freq: Two times a day (BID) | ORAL | Status: DC

## 2015-07-07 NOTE — Progress Notes (Signed)
Name: Sherri Matthews   MRN: [id number]    DOB: [birth year]   Date:07/07/2015       Progress Note  Subjective  Chief Complaint  Chief Complaint  Patient presents with  . Hypertension    follow up B/P check    Hypertension This is a chronic problem. The current episode started more than 1 year ago. The problem has been waxing and waning since onset. The problem is controlled. Associated symptoms include anxiety. Pertinent negatives include no blurred vision, chest pain, headaches, malaise/fatigue, neck pain, orthopnea, palpitations, peripheral edema, PND, shortness of breath or sweats. There are no associated agents to hypertension. Past treatments include beta blockers and diuretics. The current treatment provides moderate improvement. There are no compliance problems.  There is no history of angina, kidney disease, CAD/MI, CVA, heart failure, left ventricular hypertrophy, PVD, renovascular disease or retinopathy. There is no history of chronic renal disease or a hypertension causing med.    No problem-specific assessment & plan notes found for this encounter.   Past Medical History  Diagnosis Date  . Hypertension   . Hyperlipidemia   . Heart murmur   . Mitral valve prolapse   . AV node dysfunction     tachycardia see Dr. Lady Gary  . Sleep apnea     possible but not tested  . Cervical disc disease     degenerative disk   . Palpitations     Past Surgical History  Procedure Laterality Date  . Hernia repair    . Appendectomy  2008  . Esophagogastroduodenoscopy (egd) with propofol N/A 04/18/2015    Procedure: ESOPHAGOGASTRODUODENOSCOPY (EGD) WITH PROPOFOL;  Surgeon: Midge Minium, MD;  Location: [facility];  Service: Endoscopy;  Laterality: N/A;    Family History  Problem Relation Age of Onset  . Atrial fibrillation Mother   . Atrial fibrillation Father   . Hypertension Mother   . Diabetes Mother     Social History   Social History  . Marital Status: Married     Spouse Name: N/A  . Number of Children: N/A  . Years of Education: N/A   Occupational History  . Not on file.   Social History Main Topics  . Smoking status: Never Smoker   . Smokeless tobacco: Never Used  . Alcohol Use: No  . Drug Use: No  . Sexual Activity: Not on file   Other Topics Concern  . Not on file   Social History Narrative    Allergies  Allergen Reactions  . Oxycodone-Acetaminophen Itching  . Ciprofloxacin Nausea And Vomiting  . Fenofibrate     myalgia  . Sertraline Other (See Comments)    Pt prefers to never be on this again.       Review of Systems  Constitutional: Negative for fever, chills, weight loss and malaise/fatigue.  HENT: Negative for ear discharge, ear pain and sore throat.   Eyes: Negative for blurred vision.  Respiratory: Negative for cough, sputum production, shortness of breath and wheezing.   Cardiovascular: Negative for chest pain, palpitations, orthopnea, leg swelling and PND.  Gastrointestinal: Negative for heartburn, nausea, abdominal pain, diarrhea, constipation, blood in stool and melena.  Genitourinary: Negative for dysuria, urgency, frequency and hematuria.  Musculoskeletal: Negative for myalgias, back pain, joint pain and neck pain.  Skin: Negative for rash.  Neurological: Negative for dizziness, tingling, sensory change, focal weakness and headaches.  Endo/Heme/Allergies: Negative for environmental allergies and polydipsia. Does not bruise/bleed easily.  Psychiatric/Behavioral: Negative for depression and suicidal ideas. The  patient is not nervous/anxious and does not have insomnia.      Objective  Filed Vitals:   07/07/15 1552  BP: 130/72  Pulse: 80  Height: 5\' 11"  (1.803 m)  Weight: 223 lb (101.152 kg)    Physical Exam  Constitutional: She is well-developed, well-nourished, and in no distress. No distress.  HENT:  Head: Normocephalic and atraumatic.  Right Ear: External ear normal.  Left Ear: External ear normal.   Nose: Nose normal.  Mouth/Throat: Oropharynx is clear and moist.  Eyes: Conjunctivae and EOM are normal. Pupils are equal, round, and reactive to light. Right eye exhibits no discharge. Left eye exhibits no discharge.  Neck: Normal range of motion. Neck supple. No JVD present. No thyromegaly present.  Cardiovascular: Normal rate, regular rhythm, normal heart sounds and intact distal pulses.  Exam reveals no gallop and no friction rub.   No murmur heard. Pulmonary/Chest: Effort normal and breath sounds normal.  Abdominal: Soft. Bowel sounds are normal. She exhibits no mass. There is no tenderness. There is no guarding.  Musculoskeletal: Normal range of motion. She exhibits no edema.  Lymphadenopathy:    She has no cervical adenopathy.  Neurological: She is alert.  Skin: Skin is warm and dry. She is not diaphoretic.  Psychiatric: Mood and affect normal.  Nursing note and vitals reviewed.     Assessment & Plan  Problem List Items Addressed This Visit      Cardiovascular and Mediastinum   Essential (primary) hypertension - Primary   Relevant Medications   metoprolol tartrate (LOPRESSOR) 25 MG tablet   hydrochlorothiazide (HYDRODIURIL) 12.5 MG tablet        Dr. Hayden Rasmusseneanna Jayten Gabbard [facility]  07/07/2015

## 2015-07-17 ENCOUNTER — Other Ambulatory Visit: Payer: Self-pay | Admitting: Family Medicine

## 2015-08-08 ENCOUNTER — Encounter: Payer: Self-pay | Admitting: Gastroenterology

## 2015-09-11 ENCOUNTER — Encounter: Payer: Self-pay | Admitting: Emergency Medicine

## 2015-09-11 ENCOUNTER — Inpatient Hospital Stay
Admission: EM | Admit: 2015-09-11 | Discharge: 2015-09-12 | DRG: 287 | Disposition: A | Discharge status: Home / Self Care | Payer: BC Managed Care – PPO | Attending: Internal Medicine | Admitting: Internal Medicine

## 2015-09-11 ENCOUNTER — Ambulatory Visit (INDEPENDENT_AMBULATORY_CARE_PROVIDER_SITE_OTHER)
Admission: AD | Admit: 2015-09-11 | Discharge: 2015-09-11 | Disposition: A | Discharge status: Home / Self Care | Payer: BC Managed Care – PPO | Source: Home / Self Care | Attending: Family Medicine | Admitting: Family Medicine

## 2015-09-11 ENCOUNTER — Emergency Department: Payer: BC Managed Care – PPO

## 2015-09-11 DIAGNOSIS — K219 Gastro-esophageal reflux disease without esophagitis: Secondary | ICD-10-CM | POA: Diagnosis present

## 2015-09-11 DIAGNOSIS — Z79899 Other long term (current) drug therapy: Secondary | ICD-10-CM

## 2015-09-11 DIAGNOSIS — I200 Unstable angina: Principal | ICD-10-CM | POA: Diagnosis present

## 2015-09-11 DIAGNOSIS — E785 Hyperlipidemia, unspecified: Secondary | ICD-10-CM | POA: Diagnosis present

## 2015-09-11 DIAGNOSIS — Z8249 Family history of ischemic heart disease and other diseases of the circulatory system: Secondary | ICD-10-CM | POA: Diagnosis not present

## 2015-09-11 DIAGNOSIS — R011 Cardiac murmur, unspecified: Secondary | ICD-10-CM | POA: Diagnosis present

## 2015-09-11 DIAGNOSIS — Z885 Allergy status to narcotic agent status: Secondary | ICD-10-CM | POA: Diagnosis not present

## 2015-09-11 DIAGNOSIS — Z833 Family history of diabetes mellitus: Secondary | ICD-10-CM

## 2015-09-11 DIAGNOSIS — Z886 Allergy status to analgesic agent status: Secondary | ICD-10-CM

## 2015-09-11 DIAGNOSIS — I10 Essential (primary) hypertension: Secondary | ICD-10-CM | POA: Diagnosis present

## 2015-09-11 DIAGNOSIS — G4730 Sleep apnea, unspecified: Secondary | ICD-10-CM | POA: Diagnosis present

## 2015-09-11 DIAGNOSIS — Z9049 Acquired absence of other specified parts of digestive tract: Secondary | ICD-10-CM

## 2015-09-11 DIAGNOSIS — R0789 Other chest pain: Secondary | ICD-10-CM | POA: Diagnosis not present

## 2015-09-11 DIAGNOSIS — I209 Angina pectoris, unspecified: Secondary | ICD-10-CM

## 2015-09-11 DIAGNOSIS — F419 Anxiety disorder, unspecified: Secondary | ICD-10-CM | POA: Diagnosis present

## 2015-09-11 DIAGNOSIS — R0602 Shortness of breath: Secondary | ICD-10-CM | POA: Diagnosis not present

## 2015-09-11 DIAGNOSIS — K589 Irritable bowel syndrome without diarrhea: Secondary | ICD-10-CM | POA: Diagnosis present

## 2015-09-11 DIAGNOSIS — R079 Chest pain, unspecified: Secondary | ICD-10-CM

## 2015-09-11 DIAGNOSIS — Z888 Allergy status to other drugs, medicaments and biological substances status: Secondary | ICD-10-CM

## 2015-09-11 HISTORY — DX: Anxiety disorder, unspecified: F41.9

## 2015-09-11 HISTORY — DX: Irritable bowel syndrome, unspecified: K58.9

## 2015-09-11 HISTORY — DX: Unspecified abdominal pain: R10.9

## 2015-09-11 HISTORY — DX: Other chronic pain: G89.29

## 2015-09-11 LAB — COMPREHENSIVE METABOLIC PANEL
ALK PHOS: 86 U/L (ref 38–126)
ALT: 29 U/L (ref 14–54)
AST: 35 U/L (ref 15–41)
Albumin: 4.1 g/dL (ref 3.5–5.0)
Anion gap: 10 (ref 5–15)
BILIRUBIN TOTAL: 0.7 mg/dL (ref 0.3–1.2)
BUN: 11 mg/dL (ref 6–20)
CALCIUM: 9.5 mg/dL (ref 8.9–10.3)
CO2: 23 mmol/L (ref 22–32)
CREATININE: 0.71 mg/dL (ref 0.44–1.00)
Chloride: 104 mmol/L (ref 101–111)
GFR calc non Af Amer: >60 mL/min (ref >60)
Glucose, Bld: 120 mg/dL — ABNORMAL HIGH (ref 65–99)
Potassium: 3 mmol/L — ABNORMAL LOW (ref 3.5–5.1)
SODIUM: 137 mmol/L (ref 135–145)
TOTAL PROTEIN: 7.6 g/dL (ref 6.5–8.1)

## 2015-09-11 LAB — LIPID PANEL
CHOL/HDL RATIO: 4.5 ratio
Cholesterol: 177 mg/dL (ref 0–200)
HDL: 39 mg/dL — AB (ref >40)
LDL Cholesterol: 102 mg/dL — ABNORMAL HIGH (ref 0–99)
TRIGLYCERIDES: 179 mg/dL — AB (ref <150)
VLDL: 36 mg/dL (ref 0–40)

## 2015-09-11 LAB — CBC
HEMATOCRIT: 36.3 % (ref 35.0–47.0)
HEMOGLOBIN: 12.6 g/dL (ref 12.0–16.0)
MCH: 26.9 pg (ref 26.0–34.0)
MCHC: 34.7 g/dL (ref 32.0–36.0)
MCV: 77.4 fL — AB (ref 80.0–100.0)
Platelets: 215 10*3/uL (ref 150–440)
RBC: 4.69 MIL/uL (ref 3.80–5.20)
RDW: 16.4 % — AB (ref 11.5–14.5)
WBC: 8 10*3/uL (ref 3.6–11.0)

## 2015-09-11 LAB — TROPONIN I
Troponin I: <0.03 ng/mL (ref <0.03)
Troponin I: <0.03 ng/mL (ref <0.03)

## 2015-09-11 LAB — HEPARIN LEVEL (UNFRACTIONATED): Heparin Unfractionated: 0.37 IU/mL (ref 0.30–0.70)

## 2015-09-11 LAB — PROTIME-INR
INR: 1.12
Prothrombin Time: 14.6 seconds (ref 11.4–15.0)

## 2015-09-11 LAB — APTT: aPTT: 27 seconds (ref 24–36)

## 2015-09-11 LAB — TSH: TSH: 0.502 u[IU]/mL (ref 0.350–4.500)

## 2015-09-11 MED ORDER — ASPIRIN 325 MG PO TABS
325.0000 mg | ORAL_TABLET | Freq: Every day | ORAL | Status: DC
  Administered 2015-09-12: 325 mg via ORAL
  Filled 2015-09-11 (×2): qty 1

## 2015-09-11 MED ORDER — ALPRAZOLAM 0.5 MG PO TABS
ORAL_TABLET | ORAL | Status: AC
  Administered 2015-09-11: 0.25 mg via ORAL
  Filled 2015-09-11: qty 1

## 2015-09-11 MED ORDER — ONDANSETRON HCL 4 MG PO TABS
4.0000 mg | ORAL_TABLET | Freq: Four times a day (QID) | ORAL | Status: DC | PRN
Start: 2015-09-11 — End: 2015-09-12

## 2015-09-11 MED ORDER — NITROGLYCERIN IN D5W 200-5 MCG/ML-% IV SOLN
3.0000 ug/min | Freq: Once | INTRAVENOUS | Status: AC
  Administered 2015-09-11: 3 ug/min via INTRAVENOUS
  Filled 2015-09-11: qty 250

## 2015-09-11 MED ORDER — ALPRAZOLAM 0.5 MG PO TABS
0.2500 mg | ORAL_TABLET | Freq: Once | ORAL | Status: AC
  Administered 2015-09-11: 0.25 mg via ORAL
  Filled 2015-09-11: qty 1

## 2015-09-11 MED ORDER — ONDANSETRON HCL 4 MG/2ML IJ SOLN: 4.0000 mg | Freq: Four times a day (QID) | INTRAMUSCULAR | Status: DC | PRN

## 2015-09-11 MED ORDER — HEPARIN (PORCINE) IN NACL 100-0.45 UNIT/ML-% IJ SOLN
1200.0000 [IU]/h | INTRAMUSCULAR | Status: DC
Start: 2015-09-11 — End: 2015-09-12
  Administered 2015-09-11 – 2015-09-12 (×2): 1200 [IU]/h via INTRAVENOUS
  Filled 2015-09-11 (×3): qty 250

## 2015-09-11 MED ORDER — ALPRAZOLAM 0.5 MG PO TABS
0.2500 mg | ORAL_TABLET | Freq: Once | ORAL | Status: AC
  Administered 2015-09-11: 0.25 mg via ORAL

## 2015-09-11 MED ORDER — HEPARIN BOLUS VIA INFUSION
4000.0000 [IU] | Freq: Once | INTRAVENOUS | Status: AC
  Administered 2015-09-11: 4000 [IU] via INTRAVENOUS
  Filled 2015-09-11: qty 4000

## 2015-09-11 MED ORDER — POTASSIUM CHLORIDE CRYS ER 20 MEQ PO TBCR
40.0000 meq | EXTENDED_RELEASE_TABLET | ORAL | Status: AC
  Administered 2015-09-11 (×2): 40 meq via ORAL
  Filled 2015-09-11 (×2): qty 2

## 2015-09-11 MED ORDER — PANTOPRAZOLE SODIUM 40 MG PO TBEC
40.0000 mg | DELAYED_RELEASE_TABLET | Freq: Every day | ORAL | Status: DC
  Administered 2015-09-12: 40 mg via ORAL
  Filled 2015-09-11: qty 1

## 2015-09-11 MED ORDER — NITROGLYCERIN IN D5W 200-5 MCG/ML-% IV SOLN
3.0000 ug/min | INTRAVENOUS | Status: DC
  Administered 2015-09-12: 3 ug/min via INTRAVENOUS
  Filled 2015-09-11: qty 250

## 2015-09-11 MED ORDER — ACETAMINOPHEN 325 MG PO TABS: 650.0000 mg | ORAL_TABLET | Freq: Four times a day (QID) | ORAL | Status: DC | PRN

## 2015-09-11 MED ORDER — SODIUM CHLORIDE 0.9 % IV BOLUS (SEPSIS)
1000.0000 mL | Freq: Once | INTRAVENOUS | Status: AC
  Administered 2015-09-11: 1000 mL via INTRAVENOUS

## 2015-09-11 MED ORDER — SODIUM CHLORIDE 0.9 % IV SOLN
INTRAVENOUS | Status: DC
  Administered 2015-09-11: 23:00:00 via INTRAVENOUS

## 2015-09-11 MED ORDER — ACETAMINOPHEN 650 MG RE SUPP: 650.0000 mg | Freq: Four times a day (QID) | RECTAL | Status: DC | PRN

## 2015-09-11 MED ORDER — ATORVASTATIN CALCIUM 20 MG PO TABS
40.0000 mg | ORAL_TABLET | Freq: Every day | ORAL | Status: DC
  Administered 2015-09-11: 40 mg via ORAL
  Filled 2015-09-11: qty 2

## 2015-09-11 MED ORDER — METOPROLOL TARTRATE 25 MG PO TABS
12.5000 mg | ORAL_TABLET | Freq: Two times a day (BID) | ORAL | Status: DC
  Administered 2015-09-11 – 2015-09-12 (×2): 12.5 mg via ORAL
  Filled 2015-09-11 (×2): qty 1

## 2015-09-11 MED ORDER — ALPRAZOLAM 0.25 MG PO TABS
0.5000 mg | ORAL_TABLET | Freq: Three times a day (TID) | ORAL | Status: DC | PRN
Start: 2015-09-11 — End: 2015-09-12
  Administered 2015-09-11: 0.5 mg via ORAL
  Filled 2015-09-11: qty 2

## 2015-09-11 MED ORDER — NITROGLYCERIN 0.4 MG SL SUBL
0.4000 mg | SUBLINGUAL_TABLET | SUBLINGUAL | Status: DC | PRN
  Administered 2015-09-11 (×2): 0.4 mg via SUBLINGUAL
  Filled 2015-09-11: qty 3

## 2015-09-11 NOTE — Discharge Instructions (Signed)

## 2015-09-11 NOTE — H&P (Signed)
[facility] - [place] [facility]   PATIENT NAME: Sherri Matthews    MR#:  [id number]  DATE OF BIRTH:  14-Jun-1965  DATE OF ADMISSION:  09/11/2015  PRIMARY CARE PHYSICIAN: Elizabeth Sauer, MD   REQUESTING/REFERRING PHYSICIAN: Dr. Nita Sickle  CHIEF COMPLAINT:   Chief Complaint  Patient presents with  . Chest Pain    HISTORY OF PRESENT ILLNESS:  Sherri Matthews  is a 50 y.o. female with a known history of Anxiety, hyperlipidemia, GERD, mitral valve prolapse and history of tachycardia presents to the hospital secondary to chest tightness. Symptoms started about a week ago when she was at the beach, had back pain between her shoulder blades. It resolved with the heating pad. As soon as she declined from her vacation, she's been experiencing tightness in her chest associated with difficulty breathing. She attribute it to her anxiety. However today she was by herself at home, started having chest tightness much worse than before and presented to the emergency room. Denies any nausea or vomiting. It is still associated with the feeling that she couldn't breathe. It eased off after 2 sublingual nitroglycerin tablets. Appears very anxious on exam. No fevers or chills. Family history, mother with coronary artery disease diagnosed in her 64s. She is being admitted to stepdown with nitroglycerin and heparin drips. EKG shows ST depressions in lateral leads. First troponin is negative.  PAST MEDICAL HISTORY:   Past Medical History  Diagnosis Date  . Hypertension   . Hyperlipidemia   . Heart murmur   . Mitral valve prolapse   . AV node dysfunction     tachycardia sees Dr. Lady Gary  . Sleep apnea     possible but not tested  . Cervical disc disease     degenerative disk   . Palpitations   . Anxiety     PAST SURGICAL HISTORY:   Past Surgical History  Procedure Laterality Date  . Hernia repair    . Appendectomy  2008  . Esophagogastroduodenoscopy (egd) with propofol N/A  04/18/2015    Procedure: ESOPHAGOGASTRODUODENOSCOPY (EGD) WITH PROPOFOL;  Surgeon: Midge Minium, MD;  Location: [facility];  Service: Endoscopy;  Laterality: N/A;    SOCIAL HISTORY:   Social History  Substance Use Topics  . Smoking status: Never Smoker   . Smokeless tobacco: Never Used  . Alcohol Use: No    FAMILY HISTORY:   Family History  Problem Relation Age of Onset  . Atrial fibrillation Mother   . Atrial fibrillation Father   . Hypertension Mother   . Diabetes Mother     DRUG ALLERGIES:   Allergies  Allergen Reactions  . Oxycodone-Acetaminophen Itching  . Ciprofloxacin Nausea And Vomiting  . Fenofibrate     myalgia  . Sertraline Other (See Comments)    Pt prefers to never be on this again.      REVIEW OF SYSTEMS:   Review of Systems  Constitutional: Negative for fever, chills, weight loss and malaise/fatigue.  HENT: Negative for ear discharge, ear pain, hearing loss, nosebleeds and tinnitus.   Eyes: Negative for blurred vision, double vision and photophobia.  Respiratory: Positive for shortness of breath. Negative for cough, hemoptysis and wheezing.   Cardiovascular: Positive for chest pain. Negative for palpitations, orthopnea and leg swelling.  Gastrointestinal: Negative for heartburn, nausea, vomiting, abdominal pain, diarrhea, constipation and melena.  Genitourinary: Negative for dysuria, urgency, frequency and hematuria.  Musculoskeletal: Negative for myalgias, back pain and neck pain.  Skin: Negative for rash.  Neurological: Negative for dizziness, tingling, tremors, sensory change, speech change, focal weakness and headaches.  Endo/Heme/Allergies: Does not bruise/bleed easily.  Psychiatric/Behavioral: Negative for depression. The patient is nervous/anxious.     MEDICATIONS AT HOME:   Prior to Admission medications   Medication Sig Start Date End Date Taking? Authorizing Provider  ALPRAZolam Prudy Feeler) 0.5 MG tablet TAKE ONE TABLET BY MOUTH ONCE  DAILY AS NEEDED 07/18/15  Yes Duanne Limerick, MD  hydrochlorothiazide (HYDRODIURIL) 12.5 MG tablet Take 1 tablet (12.5 mg total) by mouth daily. Patient taking differently: Take 6.25 mg by mouth daily.  07/07/15  Yes Duanne Limerick, MD  metoprolol tartrate (LOPRESSOR) 25 MG tablet Take 0.5 tablets (12.5 mg total) by mouth 2 (two) times daily. 07/07/15  Yes Duanne Limerick, MD  omeprazole (PRILOSEC) 40 MG capsule Take 1 capsule (40 mg total) by mouth daily. 05/13/15  Yes Midge Minium, MD  simvastatin (ZOCOR) 40 MG tablet TAKE ONE TABLET BY MOUTH ONCE DAILY 07/18/15  Yes Duanne Limerick, MD      VITAL SIGNS:  Blood pressure 169/95, pulse 95, temperature 98 F (36.7 C), resp. rate 21, height  (1.803 m), weight 95.255 kg (210 lb), last menstrual period 08/17/2015, SpO2 100 %.  PHYSICAL EXAMINATION:   Physical Exam  GENERAL:  50 y.o.-year-old patient Sitting in the bed, appears anxious and tearful EYES: Pupils equal, round, reactive to light and accommodation. No scleral icterus. Extraocular muscles intact.  HEENT: Head atraumatic, normocephalic. Oropharynx and nasopharynx clear.  NECK:  Supple, no jugular venous distention. No thyroid enlargement, no tenderness.  LUNGS: Normal breath sounds bilaterally, no wheezing, rales,rhonchi or crepitation. No use of accessory muscles of respiration.  CARDIOVASCULAR: S1, S2 normal. No murmurs, rubs, or gallops.  ABDOMEN: Soft, nontender, nondistended. Bowel sounds present. No organomegaly or mass.  EXTREMITIES: No pedal edema, cyanosis, or clubbing.  NEUROLOGIC: Cranial nerves II through XII are intact. Muscle strength 5/5 in all extremities. Sensation intact. Gait not checked.  PSYCHIATRIC: The patient is alert and oriented x 3.  SKIN: No obvious rash, lesion, or ulcer.   LABORATORY PANEL:   CBC  Recent Labs Lab 09/11/15 1456  WBC 8.0  HGB 12.6  HCT 36.3  PLT 215    ------------------------------------------------------------------------------------------------------------------  Chemistries   Recent Labs Lab 09/11/15 1456  NA 137  K 3.0*  CL 104  CO2 23  GLUCOSE 120*  BUN 11  CREATININE 0.71  CALCIUM 9.5  AST 35  ALT 29  ALKPHOS 86  BILITOT 0.7   ------------------------------------------------------------------------------------------------------------------  Cardiac Enzymes  Recent Labs Lab 09/11/15 1456  TROPONINI <0.03   ------------------------------------------------------------------------------------------------------------------  RADIOLOGY:  Dg Chest 2 View  09/11/2015  CLINICAL DATA:  Difficulty breathing today. EXAM: CHEST  2 VIEW COMPARISON:  PA and lateral chest 04/12/2014. FINDINGS: The lungs are clear. Heart size is normal. No pneumothorax or pleural effusion. No focal bony abnormality. IMPRESSION: No acute disease. Electronically Signed   By: Drusilla Kanner M.D.   On: 09/11/2015 15:53    EKG:   Orders placed or performed during the hospital encounter of 09/11/15  . ED EKG  . ED EKG  . ED EKG  . ED EKG  . ED EKG  . ED EKG  . EKG 12-Lead  . EKG 12-Lead    IMPRESSION AND PLAN:   Sherri Matthews  is a 50 y.o. female with a known history of Anxiety, hyperlipidemia, GERD, mitral valve prolapse and history of tachycardia presents to the hospital secondary to chest tightness.  #  1 Unstable angina vs NSTEMI- significant chest tightness with EKG changes- ST depressions in lateral leads - Cardiology consult, admit to step down for heparin and nitro drips. - start asa, metoprolol if BP is normal. Lipid panel, statin - NPO after midnight if needs a cath - troponins recycle - ECHO ordered  #2 hyperlipidemia- on high intensity statin  #3 GERD-on Protonix  #4 anxiety-continue Xanax  #5 DVT prophylaxis-patient on heparin drip    All the records are reviewed and case discussed with ED provider. Management  plans discussed with the patient, family and they are in agreement.  CODE STATUS: Full code  TOTAL TIME TAKING CARE OF THIS PATIENT: 50 minutes.    Sherri Matthews M.D on 09/11/2015 at 5:14 PM  Between 7am to 6pm - Pager - [phone]  After 6pm go to [email or web address] - password [email or web address]  [facility]  Office  [phone]  CC: Primary care physician; [name], MD

## 2015-09-11 NOTE — ED Notes (Signed)
Patient transported to radiology

## 2015-09-11 NOTE — Progress Notes (Addendum)
ANTICOAGULATION CONSULT NOTE - Initial Consult  Pharmacy Consult for Heparin  Indication: chest pain/ACS  Allergies  Allergen Reactions  . Oxycodone-Acetaminophen Itching  . Ciprofloxacin Nausea And Vomiting  . Fenofibrate     myalgia  . Sertraline Other (See Comments)    Pt prefers to never be on this again.    . Vicodin [Hydrocodone-Acetaminophen] Other (See Comments)    Heart raced.    Patient Measurements: Height: 5\' 11"  (180.3 cm) Weight: 210 lb (95.255 kg) IBW/kg (Calculated) : 70.8 Heparin Dosing Weight: 90.5 kg   Vital Signs: Temp: 97.7 F (36.5 C) (07/20 2235) Temp Source: Oral (07/20 2235) BP: 123/77 mmHg (07/20 2235) Pulse Rate: 83 (07/20 2235)  Labs:  Recent Labs  09/11/15 1456 09/11/15 1852 09/11/15 2316  HGB 12.6  --   --   HCT 36.3  --   --   PLT 215  --   --   APTT 27  --   --   LABPROT 14.6  --   --   INR 1.12  --   --   HEPARINUNFRC  --   --  0.37  CREATININE 0.71  --   --   TROPONINI <0.03 <0.03  --     Estimated Creatinine Clearance: 104.7 mL/min (by C-G formula based on Cr of 0.71).   Medical History: Past Medical History  Diagnosis Date  . Hypertension   . Hyperlipidemia   . Heart murmur   . Mitral valve prolapse   . AV node dysfunction     tachycardia sees Dr. Lady GaryFath  . Sleep apnea     possible but not tested  . Cervical disc disease     degenerative disk   . Palpitations   . Anxiety     Medications:  Prescriptions prior to admission  Medication Sig Dispense Refill Last Dose  . ALPRAZolam (XANAX) 0.5 MG tablet TAKE ONE TABLET BY MOUTH ONCE DAILY AS NEEDED 30 tablet 0 09/11/2015 at 1215  . hydrochlorothiazide (HYDRODIURIL) 12.5 MG tablet Take 1 tablet (12.5 mg total) by mouth daily. (Patient taking differently: Take 6.25 mg by mouth daily. ) 30 tablet 6 09/11/2015 at 0800  . metoprolol tartrate (LOPRESSOR) 25 MG tablet Take 0.5 tablets (12.5 mg total) by mouth 2 (two) times daily. 30 tablet 6 09/11/2015 at 0800  . omeprazole  (PRILOSEC) 40 MG capsule Take 1 capsule (40 mg total) by mouth daily. 30 capsule 5 09/10/2015 at 1830  . simvastatin (ZOCOR) 40 MG tablet TAKE ONE TABLET BY MOUTH ONCE DAILY 30 tablet 0 09/10/2015 at Unknown time    Assessment: Pharmacy consulted to dose heparin this 50 year old female admitted with ACS/NSTEMI. CrCl = 104.7 ml/min,  No prior anticoag noted.  Goal of Therapy:  Heparin level 0.3-0.7 units/ml Monitor platelets by anticoagulation protocol: Yes   Plan:  First heparin level therapeutic. Continue current rate. Recheck heparin level with AM labs.  Second heparin level therapeutic. Continue current rate. Pharmacy will continue to monitor HL and CBC daily.  Carola FrostNathan A Reda Gettis, Pharm.D., BCPS Clinical Pharmacist 09/11/2015,11:58 PM

## 2015-09-11 NOTE — ED Notes (Signed)
Per [facility], patient comes from home. [facility] with c/o chest tightness, SOB, and tingling to bilateral hands. 4 baby ASA given. Patient able to speak full sentences. Ambulatory to restroom. Patient states last week at the beach she felt a bad pain in between her shoulder blades. Patient A&O x4.

## 2015-09-11 NOTE — ED Notes (Signed)
Patient refuses 3rd nitro tab. MD aware.

## 2015-09-11 NOTE — ED Notes (Signed)
Admitting MD at bedside.

## 2015-09-11 NOTE — ED Notes (Signed)
Patient states she developed dyspnea and feels anxious. Denies pain.

## 2015-09-11 NOTE — Progress Notes (Signed)
ANTICOAGULATION CONSULT NOTE - Initial Consult  Pharmacy Consult for Heparin  Indication: chest pain/ACS  Allergies  Allergen Reactions  . Oxycodone-Acetaminophen Itching  . Ciprofloxacin Nausea And Vomiting  . Fenofibrate     myalgia  . Sertraline Other (See Comments)    Pt prefers to never be on this again.      Patient Measurements: Height: 5\' 11"  (180.3 cm) Weight: 210 lb (95.255 kg) IBW/kg (Calculated) : 70.8 Heparin Dosing Weight: 90.5 kg   Vital Signs: Temp: 98 F (36.7 C) (07/20 1447) Temp Source: Oral (07/20 1326) BP: 114/86 mmHg (07/20 1607) Pulse Rate: 97 (07/20 1607)  Labs:  Recent Labs  09/11/15 1456  HGB 12.6  HCT 36.3  PLT 215  CREATININE 0.71  TROPONINI <0.03    Estimated Creatinine Clearance: 104.7 mL/min (by C-G formula based on Cr of 0.71).   Medical History: Past Medical History  Diagnosis Date  . Hypertension   . Hyperlipidemia   . Heart murmur   . Mitral valve prolapse   . AV node dysfunction     tachycardia see Dr. Lady GaryFath  . Sleep apnea     possible but not tested  . Cervical disc disease     degenerative disk   . Palpitations     Medications:   (Not in a hospital admission)  Assessment: Pharmacy consulted to dose heparin this 50 year old female admitted with ACS/NSTEMI. CrCl = 104.7 ml/min,  No prior anticoag noted.  Goal of Therapy:  Heparin level 0.3-0.7 units/ml Monitor platelets by anticoagulation protocol: Yes   Plan:  Give 4000 units bolus x 1  Will order heparin drip to start at 1200 units/hr.  Will draw 1st HL 6 hrs after start of drip. Will draw baseline aPTT and INR.   Shakala Marlatt D 09/11/2015,4:45 PM

## 2015-09-11 NOTE — ED Notes (Signed)
Patient given gram crackers, peanut butter, and sprite at this time.

## 2015-09-11 NOTE — ED Provider Notes (Signed)
CSN: [id number]     Arrival date & time 09/11/15  1314 History   First MD Initiated Contact with Patient 09/11/15 1336    Nurses notes were reviewed.  Chief Complaint  Patient presents with  . Shortness of Breath   Patient presents with a vague history of chest discomfort. Apparently last week when she was at the beach she had some chest pain when she was changing into her veins. She did not seek medical help at that time the pain into her neck and went to her back. After while it cleared on its own. Today she started having chest discomfort no true chest pain but a fullness and a feeling of heaviness in her chest and difficulty catching her breath. When she initially presented she was to tip neck and tachycardic and had elevated blood pressure. She's had a history of hypertension but doesn't tolerate medication well and she is on a beta blocker but she has stopped her hydrochlorothiazide multiple times due to what she felt or has side effects from the HCTZ she's got a history of hypertension hyperlipidemia heart murmur mitral valve prolapse AB no dysfunction and cervical disc disease and a history of palpitations. Past surgery is appendectomy and hernia repair. She does not smoke but her husband does vapor around her. Mother with history of atrial fib and follow-up of a tib-fib and mother with hypertension and diabetes as well. She is allergic to oxycodone/Percocet, Cipro, fenofibrate and sertraline.  (Consider location/radiation/quality/duration/timing/severity/associated sxs/prior Treatment) Patient is a 50 y.o. female presenting with shortness of breath. The history is provided by the patient and a relative. No language interpreter was used.  Shortness of Breath Severity:  Severe Onset quality:  Sudden Timing:  Constant Progression:  Worsening Chronicity:  New Context: not activity   Relieved by:  Nothing Ineffective treatments:  None tried Associated symptoms: chest pain and neck pain    Associated symptoms: no abdominal pain, no diaphoresis and no rash   Risk factors: obesity   Risk factors: no family hx of DVT, no hx of cancer, no hx of PE/DVT, no oral contraceptive use, no recent surgery and no tobacco use     Past Medical History  Diagnosis Date  . Hypertension   . Hyperlipidemia   . Heart murmur   . Mitral valve prolapse   . AV node dysfunction     tachycardia see Dr. Lady GaryFath  . Sleep apnea     possible but not tested  . Cervical disc disease     degenerative disk   . Palpitations    Past Surgical History  Procedure Laterality Date  . Hernia repair    . Appendectomy  2008  . Esophagogastroduodenoscopy (egd) with propofol N/A 04/18/2015    Procedure: ESOPHAGOGASTRODUODENOSCOPY (EGD) WITH PROPOFOL;  Surgeon: Midge Miniumarren Wohl, MD;  [facility];  Service: Endoscopy;  Laterality: N/A;   Family History  Problem Relation Age of Onset  . Atrial fibrillation Mother   . Atrial fibrillation Father   . Hypertension Mother   . Diabetes Mother    Social History  Substance Use Topics  . Smoking status: Never Smoker   . Smokeless tobacco: Never Used  . Alcohol Use: No   OB History    No data available     Review of Systems  Constitutional: Negative for diaphoresis.  Respiratory: Positive for shortness of breath.   Cardiovascular: Positive for chest pain.  Gastrointestinal: Negative for abdominal pain.  Musculoskeletal: Positive for neck pain.  Skin: Negative  for rash.  All other systems reviewed and are negative.   Allergies  Oxycodone-acetaminophen; Ciprofloxacin; Fenofibrate; and Sertraline  Home Medications   Prior to Admission medications   Medication Sig Start Date End Date Taking? Authorizing Provider  ALPRAZolam Prudy Feeler) 0.5 MG tablet TAKE ONE TABLET BY MOUTH ONCE DAILY AS NEEDED 07/18/15  Yes Duanne Limerick, MD  hydrochlorothiazide (HYDRODIURIL) 12.5 MG tablet Take 1 tablet (12.5 mg total) by mouth daily. 07/07/15  Yes Duanne Limerick,  MD  metoprolol tartrate (LOPRESSOR) 25 MG tablet Take 0.5 tablets (12.5 mg total) by mouth 2 (two) times daily. 07/07/15  Yes Duanne Limerick, MD  omeprazole (PRILOSEC) 40 MG capsule Take 1 capsule (40 mg total) by mouth daily. Patient taking differently: Take 40 mg by mouth daily. Dr Servando Snare 05/13/15  Yes Midge Minium, MD  simvastatin (ZOCOR) 40 MG tablet TAKE ONE TABLET BY MOUTH ONCE DAILY 07/18/15  Yes Duanne Limerick, MD   Meds Ordered and Administered this Visit  Medications - No data to display  BP 179/89 mmHg  Pulse 77  Temp(Src) 98.9 F (37.2 C) (Oral)  Resp 20  Ht  (1.803 m)  Wt 210 lb (95.255 kg)  BMI 29.30 kg/m2  SpO2 100%  LMP 08/17/2015 (Approximate) No data found.   Physical Exam  Constitutional: She is oriented to person, place, and time. She appears well-developed and well-nourished. She is not intubated.  HENT:  Head: Normocephalic and atraumatic.  Eyes: Conjunctivae are normal. Pupils are equal, round, and reactive to light.  Neck: Normal range of motion. Neck supple. No thyromegaly present.  Cardiovascular: Normal rate and regular rhythm.  Exam reveals distant heart sounds.   Pulmonary/Chest: No apnea, no tachypnea and no bradypnea. She is not intubated. No respiratory distress.  Abdominal: Soft. There is no hepatosplenomegaly. There is no tenderness. There is no CVA tenderness.  Musculoskeletal: Normal range of motion. She exhibits no edema.  Neurological: She is alert and oriented to person, place, and time.  Skin: Skin is warm.  Vitals reviewed.   ED Course  Procedures (including critical care time)  Labs Review Labs Reviewed - No data to display  Imaging Review No results found.   Visual Acuity Review  Right Eye Distance:   Left Eye Distance:   Bilateral Distance:    Right Eye Near:   Left Eye Near:    Bilateral Near:         MDM   1. Shortness of breath   2. Feeling of chest tightness     Patient with atypical presentation for  shortness of breath with episodes chest pain less than a week ago. Her EKG showed some nonspecific ST changes in the 3 to V6 that in appreciate an early EKG 2 years ago. Will start a saline lock given 4 baby aspirin and transferred to the ED of her choice drop which will probably be at this point either [facility] ED or [facility] ED. Should be noted patient is started on 2 L of O2 and is doing much better at this time.   ED ECG REPORT I, Yomara Toothman H, the attending physician, personally viewed and interpreted this ECG.   Date: 09/11/2015  EKG Time: 1:26:25  Rate: 80  Rhythm: normal sinus rhythm, nonspecific ST and T waves changes  Axis: 60  Intervals:Prolonged QT  ST&T Change: Nonspecific ST depression in V4 through V6 not seen an EKG done 2 years ago     Hassan Rowan, MD 09/11/15 1404

## 2015-09-11 NOTE — ED Provider Notes (Signed)
[facility] Emergency Department Provider Note  ____________________________________________  Time seen: Approximately 3:26 PM  I have reviewed the triage vital signs and the nursing notes.   HISTORY  Chief Complaint Chest Pain   HPI Sherri Matthews is a 50 y.o. female with a history of hypertension, hyperlipidemia, mitral valve prolapse, AV node tachycardia who presents for evaluation of chest pain and shortness of breath. Patient was sent here from urgent care. Patient reports that a week ago she was at the beach getting dressed when she developed sudden onset of severe pain in her upper back between her shoulder blades. She describes the pain as a severe muscle pull, radiating to R shoulder, associated with shortness of breath. She reports that she took her blood pressure which was in the 190s. She reports that the pain was there constantly for 24 hours and subsided by the next day in the morning. She reports that since then she has been having episodes of chest tightness and difficulty breathing almost on a daily basis. This morning she had a more severe episode while walking in her house. She reports that she felt pressure substernally, difficulty breathing, associated with sweating hands, and lightheadeness. She called her son who took her to urgent care. At urgent care she had an EKG showing nonspecific ST changes on V3 to V6. Patient was given a full dose of aspirin and sent here for evaluation. She continues to endorse chest tightness and difficulty taking a deep breath. She reports family history of ischemic heart disease on her mother who had a heart attack in her mid 5950s. She denies any history of smoking. She denies personal or family history of blood clots, recent prolonged travel or immobilization, history of cancer, history of exogenous hormones. Patient reports she had a stress test 2 years ago which was negative.  Past Medical History  Diagnosis Date    . Hypertension   . Hyperlipidemia   . Heart murmur   . Mitral valve prolapse   . AV node dysfunction     tachycardia sees Dr. Lady GaryFath  . Sleep apnea     possible but not tested  . Cervical disc disease     degenerative disk   . Palpitations   . Anxiety     Patient Active Problem List   Diagnosis Date Noted  . Unstable angina (HCC) 09/11/2015  . Essential hypertension 05/15/2015  . Hyperlipidemia 05/15/2015  . Abdominal pain, right upper quadrant   . Abdominal pain, left upper quadrant   . Gastritis   . 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 Routine general medical examination at a health care facility 07/16/2014  . 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 Essential (primary) hypertension 07/16/2014  . Heart murmur 07/16/2014  . Gastroesophageal reflux disease 07/16/2014  . H/O: osteoarthritis 07/16/2014    Past Surgical History  Procedure Laterality Date  . Hernia repair    . Appendectomy  2008  . Esophagogastroduodenoscopy (egd) with propofol N/A 04/18/2015    Procedure: ESOPHAGOGASTRODUODENOSCOPY (EGD) WITH PROPOFOL;  Surgeon: Midge Miniumarren Wohl, MD;  [facility];  Service: Endoscopy;  Laterality: N/A;    Current Outpatient Rx  Name  Route  Sig  Dispense  Refill  . ALPRAZolam (XANAX) 0.5 MG tablet      TAKE ONE TABLET BY MOUTH ONCE DAILY AS NEEDED   30 tablet   0   . hydrochlorothiazide (HYDRODIURIL) 12.5 MG  tablet   Oral   Take 1 tablet (12.5 mg total) by mouth daily. Patient taking differently: Take 6.25 mg by mouth daily.    30 tablet   6   . metoprolol tartrate (LOPRESSOR) 25 MG tablet   Oral   Take 0.5 tablets (12.5 mg total) by mouth 2 (two) times daily.   30 tablet   6   . omeprazole (PRILOSEC) 40 MG capsule   Oral   Take 1 capsule (40 mg total) by mouth daily.   30 capsule   5   . simvastatin (ZOCOR) 40 MG tablet       TAKE ONE TABLET BY MOUTH ONCE DAILY   30 tablet   0     Allergies Oxycodone-acetaminophen; Ciprofloxacin; Fenofibrate; Sertraline; and Vicodin  Family History  Problem Relation Age of Onset  . Atrial fibrillation Mother   . Atrial fibrillation Father   . Hypertension Mother   . Diabetes Mother     Social History Social History  Substance Use Topics  . Smoking status: Never Smoker   . Smokeless tobacco: Never Used  . Alcohol Use: No    Review of Systems  Constitutional: Negative for fever. + Lightheadedness Eyes: Negative for visual changes. ENT: Negative for sore throat. Cardiovascular: + chest pain. Respiratory: + shortness of breath. Gastrointestinal: Negative for abdominal pain, vomiting or diarrhea. Genitourinary: Negative for dysuria. Musculoskeletal: Negative for back pain. Skin: Negative for rash. Neurological: Negative for headaches, weakness or numbness.  ____________________________________________   PHYSICAL EXAM:  VITAL SIGNS: ED Triage Vitals  Enc Vitals Group     BP 09/11/15 1447 153/90 mmHg     Pulse Rate 09/11/15 1447 72     Resp 09/11/15 1447 18     Temp 09/11/15 1447 98 F (36.7 C)     Temp src --      SpO2 09/11/15 1447 99 %     Weight 09/11/15 1447 210 lb (95.255 kg)     Height 09/11/15 1447  (1.803 m)     Head Cir --      Peak Flow --      Pain Score 09/11/15 1448 4     Pain Loc --      Pain Edu? --      Excl. in GC? --     Constitutional: Alert and oriented. Well appearing and in no apparent distress. HEENT:      Head: Normocephalic and atraumatic.         Eyes: Conjunctivae are normal. Sclera is non-icteric. EOMI. PERRL      Mouth/Throat: Mucous membranes are moist.       Neck: Supple with no signs of meningismus. Cardiovascular: Regular rate and rhythm. No murmurs, gallops, or rubs. 2+ symmetrical distal pulses are present in all extremities. No JVD. Respiratory: Normal respiratory effort. Lungs are clear to  auscultation bilaterally. No wheezes, crackles, or rhonchi.  Gastrointestinal: Soft, non tender, and non distended with positive bowel sounds. No rebound or guarding. Genitourinary: No CVA tenderness. Musculoskeletal: Nontender with normal range of motion in all extremities. No edema, cyanosis, or erythema of extremities. Neurologic: Normal speech and language. Face is symmetric. Moving all extremities. No gross focal neurologic deficits are appreciated. Skin: Skin is warm, dry and intact. No rash noted. Psychiatric: Mood and affect are normal. Speech and behavior are normal.  ____________________________________________   LABS (all labs ordered are listed, but only abnormal results are displayed)  Labs Reviewed  CBC - Abnormal; Notable for the following:  MCV 77.4 (*)    RDW 16.4 (*)    All other components within normal limits  COMPREHENSIVE METABOLIC PANEL - Abnormal; Notable for the following:    Potassium 3.0 (*)    Glucose, Bld 120 (*)    All other components within normal limits  TROPONIN I  PROTIME-INR  APTT  LIPID PANEL  TSH  HEPARIN LEVEL (UNFRACTIONATED)   ____________________________________________  EKG  ED ECG REPORT I, Nita Sickle, the attending physician, personally viewed and interpreted this ECG.  Normal sinus rhythm, rate of 73, normal intervals, normal axis, ST depressions on inferior and lateral leads, no ST elevations. These findings are new compared to prior EKG. ____________________________________________  RADIOLOGY  CXR: Negative ____________________________________________   PROCEDURES  Procedure(s) performed: None Critical Care performed: yes  CRITICAL CARE Performed by: Nita Sickle  ?  Total critical care time: 40 min  Critical care time was exclusive of separately billable procedures and treating other patients.  Critical care was necessary to treat or prevent imminent or life-threatening deterioration.  Critical  care was time spent personally by me on the following activities: development of treatment plan with patient and/or surrogate as well as nursing, discussions with consultants, evaluation of patient's response to treatment, examination of patient, obtaining history from patient or surrogate, ordering and performing treatments and interventions, ordering and review of laboratory studies, ordering and review of radiographic studies, pulse oximetry and re-evaluation of patient's condition.  ____________________________________________   INITIAL IMPRESSION / ASSESSMENT AND PLAN / ED COURSE  50 y.o. female with a history of hypertension, hyperlipidemia, mitral valve prolapse, AV node tachycardia who presents for evaluation of multiple episodes of the chest tightness and shortness of breath since last week. Patient with new EKG changes showing ST depressions on inferior lateral leads. She received a full dose aspirin urgent care prior to transportation. Plan to check labs, troponin, chest x-ray, and admit for further evaluation.  _________________________ 4:26 PM on 09/11/2015 -----------------------------------------  Patient received 2 sublingual nitros with improvement and resolution of her chest tightness. Repeat EKG shows worsening depressions on inferior leads with change in the morphology of ST segment in aVL, although not meeting STEMI criteria. Cardiology paged. First troponin is negative.  _________________________ 4:35 PM on 09/11/2015 -----------------------------------------  Spoke to Dr. Cassie Freer on call for STEMI team who looked at patient's EKG and agrees EKG does not meet STEMI criteria at this time. Patient is pain free. Will start patient on heparin gtt. Waiting to hear from Cardiology MD unassigned.   _________________________ 4:42 PM on 09/11/2015 -----------------------------------------  Patient now complaining of mild chest discomfort. Nitro drip and heparin drip have been  ordered. Cardiology has been paged again. We'll repeat a third EKG at this time.  _________________________ 4:50 PM on 09/11/2015 -----------------------------------------  Spoke with Dr. Kirke Corin cardiology who agrees with Dr. Cassie Freer of no cath lab for now. Third EKG showing improvement on ST depressions and no ST elevations. We'll discuss with the hospitalist for admission.  Pertinent labs & imaging results that were available during my care of the patient were reviewed by me and considered in my medical decision making (see chart for details).    ____________________________________________   FINAL CLINICAL IMPRESSION(S) / ED DIAGNOSES  Final diagnoses:  Ischemic chest pain (HCC)      NEW MEDICATIONS STARTED DURING THIS VISIT:  New Prescriptions   No medications on file     Note:  This document was prepared using Dragon voice recognition software and may include unintentional dictation errors.  Nita Sickle, MD 09/11/15 [phone]

## 2015-09-12 ENCOUNTER — Encounter
Admission: EM | Disposition: A | Discharge status: Home / Self Care | Payer: Self-pay | Source: Home / Self Care | Attending: Internal Medicine

## 2015-09-12 ENCOUNTER — Encounter: Payer: Self-pay | Admitting: Certified Registered Nurse Anesthetist

## 2015-09-12 HISTORY — PX: CARDIAC CATHETERIZATION: SHX172

## 2015-09-12 LAB — CBC
HCT: 33.9 % — ABNORMAL LOW (ref 35.0–47.0)
Hemoglobin: 11.5 g/dL — ABNORMAL LOW (ref 12.0–16.0)
MCH: 26.5 pg (ref 26.0–34.0)
MCHC: 33.9 g/dL (ref 32.0–36.0)
MCV: 78.2 fL — ABNORMAL LOW (ref 80.0–100.0)
PLATELETS: 200 10*3/uL (ref 150–440)
RBC: 4.33 MIL/uL (ref 3.80–5.20)
RDW: 16.2 % — ABNORMAL HIGH (ref 11.5–14.5)
WBC: 5.9 10*3/uL (ref 3.6–11.0)

## 2015-09-12 LAB — TROPONIN I
Troponin I: <0.03 ng/mL (ref <0.03)
Troponin I: <0.03 ng/mL (ref <0.03)

## 2015-09-12 LAB — BASIC METABOLIC PANEL
Anion gap: 6 (ref 5–15)
BUN: 10 mg/dL (ref 6–20)
CALCIUM: 8.6 mg/dL — AB (ref 8.9–10.3)
CHLORIDE: 111 mmol/L (ref 101–111)
CO2: 25 mmol/L (ref 22–32)
CREATININE: 0.55 mg/dL (ref 0.44–1.00)
Glucose, Bld: 102 mg/dL — ABNORMAL HIGH (ref 65–99)
Potassium: 3.8 mmol/L (ref 3.5–5.1)
SODIUM: 142 mmol/L (ref 135–145)

## 2015-09-12 LAB — HEPARIN LEVEL (UNFRACTIONATED): HEPARIN UNFRACTIONATED: 0.39 [IU]/mL (ref 0.30–0.70)

## 2015-09-12 SURGERY — LEFT HEART CATH AND CORONARY ANGIOGRAPHY
Anesthesia: Moderate Sedation

## 2015-09-12 MED ORDER — FENTANYL CITRATE (PF) 100 MCG/2ML IJ SOLN
INTRAMUSCULAR | Status: DC | PRN
  Administered 2015-09-12: 25 ug via INTRAVENOUS

## 2015-09-12 MED ORDER — MIDAZOLAM HCL 2 MG/2ML IJ SOLN
INTRAMUSCULAR | Status: AC
  Filled 2015-09-12: qty 2

## 2015-09-12 MED ORDER — SODIUM CHLORIDE 0.9% FLUSH: 3.0000 mL | Freq: Two times a day (BID) | INTRAVENOUS | Status: DC

## 2015-09-12 MED ORDER — SODIUM CHLORIDE 0.9% FLUSH
3.0000 mL | Freq: Two times a day (BID) | INTRAVENOUS | Status: DC
  Administered 2015-09-12: 3 mL via INTRAVENOUS

## 2015-09-12 MED ORDER — SODIUM CHLORIDE 0.9 % WEIGHT BASED INFUSION: 3.0000 mL/kg/h | INTRAVENOUS | Status: DC

## 2015-09-12 MED ORDER — HEPARIN (PORCINE) IN NACL 2-0.9 UNIT/ML-% IJ SOLN
INTRAMUSCULAR | Status: AC
  Filled 2015-09-12: qty 1000

## 2015-09-12 MED ORDER — MIDAZOLAM HCL 2 MG/2ML IJ SOLN
INTRAMUSCULAR | Status: DC | PRN
  Administered 2015-09-12: 1 mg via INTRAVENOUS

## 2015-09-12 MED ORDER — SODIUM CHLORIDE 0.9 % IV SOLN: 250.0000 mL | INTRAVENOUS | Status: DC | PRN

## 2015-09-12 MED ORDER — FENTANYL CITRATE (PF) 100 MCG/2ML IJ SOLN
INTRAMUSCULAR | Status: AC
  Filled 2015-09-12: qty 2

## 2015-09-12 MED ORDER — ONDANSETRON HCL 4 MG/2ML IJ SOLN: 4.0000 mg | Freq: Four times a day (QID) | INTRAMUSCULAR | Status: DC | PRN

## 2015-09-12 MED ORDER — SODIUM CHLORIDE 0.9% FLUSH: 3.0000 mL | INTRAVENOUS | Status: DC | PRN

## 2015-09-12 MED ORDER — IOPAMIDOL (ISOVUE-300) INJECTION 61%
INTRAVENOUS | Status: DC | PRN
  Administered 2015-09-12: 110 mL via INTRA_ARTERIAL

## 2015-09-12 MED ORDER — ASPIRIN 81 MG PO CHEW: 81.0000 mg | CHEWABLE_TABLET | ORAL | Status: DC

## 2015-09-12 MED ORDER — SODIUM CHLORIDE 0.9 % WEIGHT BASED INFUSION
1.0000 mL/kg/h | INTRAVENOUS | Status: DC
Start: 2015-09-13 — End: 2015-09-12

## 2015-09-12 MED ORDER — OMEPRAZOLE 40 MG PO CPDR: 40.0000 mg | DELAYED_RELEASE_CAPSULE | Freq: Two times a day (BID) | ORAL | Status: DC

## 2015-09-12 SURGICAL SUPPLY — 9 items
CATH INFINITI 5FR ANG PIGTAIL (CATHETERS) ×2 IMPLANT
CATH INFINITI 5FR JL4 (CATHETERS) ×2 IMPLANT
CATH INFINITI JR4 5F (CATHETERS) ×2 IMPLANT
DEVICE CLOSURE MYNXGRIP 5F (Vascular Products) ×2 IMPLANT
KIT MANI 3VAL PERCEP (MISCELLANEOUS) ×2 IMPLANT
NEEDLE PERC 18GX7CM (NEEDLE) ×2 IMPLANT
PACK CARDIAC CATH (CUSTOM PROCEDURE TRAY) ×2 IMPLANT
SHEATH AVANTI 5FR X 11CM (SHEATH) ×2 IMPLANT
WIRE EMERALD 3MM-J .035X150CM (WIRE) ×2 IMPLANT

## 2015-09-12 NOTE — Discharge Instructions (Signed)
Resume diet as before  F/U with PCP in 1 week

## 2015-09-12 NOTE — Consult Note (Signed)
[facility]  CARDIOLOGY CONSULT NOTE  Patient ID: Sherri GoldsLisa Wilborn Penafiel MRN: [id number] DOB/AGE: September 26, 1965 50 y.o.  Admit date: 09/11/2015 Referring Physician Nemiah CommanderKalisetti Primary Physician Yetta BarreJones Primary Cardiologist  Reason for Consultation Chest pain  HPI: 50 year old female referred for evaluation chest pain. The patient reports that she was in usual state of health until last week while vacationing at the beach she experienced an episode of scapular pain with radiation to her right arm. Symptoms lasted on and off for approximately 24 hours. The next day, the patient experienced an episode of shortness of breath and generalized weakness. She returned home where she felt lethargic but otherwise appeared to be improving. Yesterday, the patient experienced substernal chest tightness with associated breath, [facility] emergency room, where ECG revealed ST depression in the inferolateral leads. The patient was treated with sublingual nitroglycerin, started on nitroglycerin and heparin drip with resolution of chest pain. The patient has ruled out for myocardial infarction with negative troponin.  Review of systems complete and found to be negative unless listed above     Past Medical History  Diagnosis Date  . Hypertension   . Hyperlipidemia   . Heart murmur   . Mitral valve prolapse   . AV node dysfunction     tachycardia sees Dr. Lady GaryFath  . Sleep apnea     possible but not tested  . Cervical disc disease     degenerative disk   . Palpitations   . Anxiety   . IBS (irritable bowel syndrome)   . Chronic abdominal pain     Past Surgical History  Procedure Laterality Date  . Hernia repair    . Appendectomy  2008  . Esophagogastroduodenoscopy (egd) with propofol N/A 04/18/2015    Procedure: ESOPHAGOGASTRODUODENOSCOPY (EGD) WITH PROPOFOL;  Surgeon: Midge Miniumarren Wohl, MD;  [facility];  Service: Endoscopy;  Laterality: N/A;    Prescriptions prior to admission  Medication  Sig Dispense Refill Last Dose  . ALPRAZolam (XANAX) 0.5 MG tablet TAKE ONE TABLET BY MOUTH ONCE DAILY AS NEEDED 30 tablet 0 09/11/2015 at 1215  . hydrochlorothiazide (HYDRODIURIL) 12.5 MG tablet Take 1 tablet (12.5 mg total) by mouth daily. (Patient taking differently: Take 6.25 mg by mouth daily. ) 30 tablet 6 09/11/2015 at 0800  . metoprolol tartrate (LOPRESSOR) 25 MG tablet Take 0.5 tablets (12.5 mg total) by mouth 2 (two) times daily. 30 tablet 6 09/11/2015 at 0800  . omeprazole (PRILOSEC) 40 MG capsule Take 1 capsule (40 mg total) by mouth daily. 30 capsule 5 09/10/2015 at 1830  . simvastatin (ZOCOR) 40 MG tablet TAKE ONE TABLET BY MOUTH ONCE DAILY 30 tablet 0 09/10/2015 at Unknown time   Social History   Social History  . Marital Status: Married    Spouse Name: N/A  . Number of Children: N/A  . Years of Education: N/A   Occupational History  . Not on file.   Social History Main Topics  . Smoking status: Never Smoker   . Smokeless tobacco: Never Used  . Alcohol Use: No  . Drug Use: No  . Sexual Activity: Not on file   Other Topics Concern  . Not on file   Social History Narrative   Lives at home and independent at baseline    Family History  Problem Relation Age of Onset  . Atrial fibrillation Mother   . Atrial fibrillation Father   . Hypertension Mother   . Diabetes Mother       Review of systems complete  and found to be negative unless listed above      PHYSICAL EXAM  General: Well developed, well nourished, in no acute distress HEENT:  Normocephalic and atramatic Neck:  No JVD.  Lungs: Clear bilaterally to auscultation and percussion. Heart: HRRR . Normal S1 and S2 without gallops or murmurs.  Abdomen: Bowel sounds are positive, abdomen soft and non-tender  Msk:  Back normal, normal gait. Normal strength and tone for age. Extremities: No clubbing, cyanosis or edema.   Neuro: Alert and oriented X 3. Psych:  Good affect, responds appropriately  Labs:   Lab  Results  Component Value Date   WBC 5.9 09/12/2015   HGB 11.5* 09/12/2015   HCT 33.9* 09/12/2015   MCV 78.2* 09/12/2015   PLT 200 09/12/2015    Recent Labs Lab 09/11/15 1456 09/12/15 0420  NA 137 142  K 3.0* 3.8  CL 104 111  CO2 23 25  BUN 11 10  CREATININE 0.71 0.55  CALCIUM 9.5 8.6*  PROT 7.6  --   BILITOT 0.7  --   ALKPHOS 86  --   ALT 29  --   AST 35  --   GLUCOSE 120* 102*   Lab Results  Component Value Date   CKTOTAL 123 04/02/2013   CKMB 0.7 04/02/2013   TROPONINI <0.03 09/12/2015    Lab Results  Component Value Date   CHOL 177 09/11/2015   CHOL 168 05/15/2015   CHOL 173 07/08/2014   Lab Results  Component Value Date   HDL 39* 09/11/2015   HDL 45 05/15/2015   HDL 48 07/08/2014   Lab Results  Component Value Date   LDLCALC 102* 09/11/2015   LDLCALC 96 05/15/2015   LDLCALC 92 07/08/2014   Lab Results  Component Value Date   TRIG 179* 09/11/2015   TRIG 136 05/15/2015   TRIG 166* 07/08/2014   Lab Results  Component Value Date   CHOLHDL 4.5 09/11/2015   CHOLHDL 3.7 05/15/2015   No results found for: LDLDIRECT    Radiology: Dg Chest 2 View  09/11/2015  CLINICAL DATA:  Difficulty breathing today. EXAM: CHEST  2 VIEW COMPARISON:  PA and lateral chest 04/12/2014. FINDINGS: The lungs are clear. Heart size is normal. No pneumothorax or pleural effusion. No focal bony abnormality. IMPRESSION: No acute disease. Electronically Signed   By: Drusilla Kanner M.D.   On: 09/11/2015 15:53    EKG: Normal sinus rhythm  ASSESSMENT AND PLAN:   1. Unstable angina, with chest pain and associated ischemic ECG changes, improved after sublingual and intravenous nitroglycerin  Recommendations  1. Agree with current therapy 2. Continue nitroglycerin and heparin drip 3. Proceed to cardiac catheterization with selective coronary arteriography. The risks, benefits alternatives to cardiac catheterization were explained to the patient and informed consent was  obtained.  SignedMarcina Millard MD,PhD, [facility] 09/12/2015, 8:08 AM

## 2015-09-12 NOTE — Progress Notes (Signed)
No complaints of pain since arrival to unit.  Continues to tolerate nitroglycerin drip at 813mcg/min.  VS/telemetry stable.

## 2015-09-12 NOTE — Progress Notes (Signed)
Patient is discharge home in a stable condition, summary given to pt verbalized understanding , left with family

## 2015-09-12 NOTE — Progress Notes (Signed)
ANTICOAGULATION CONSULT NOTE - follow up Consult  Pharmacy Consult for Heparin  Indication: chest pain/ACS  Allergies  Allergen Reactions  . Oxycodone-Acetaminophen Itching  . Ciprofloxacin Nausea And Vomiting  . Fenofibrate     myalgia  . Sertraline Other (See Comments)    Pt prefers to never be on this again.    . Vicodin [Hydrocodone-Acetaminophen] Other (See Comments)    Heart raced.    Patient Measurements: Height: 5\' 11"  (180.3 cm) Weight: 210 lb (95.255 kg) IBW/kg (Calculated) : 70.8 Heparin Dosing Weight: 90.5 kg   Vital Signs: Temp: 97.7 F (36.5 C) (07/20 2235) Temp Source: Oral (07/20 2235) BP: 129/76 mmHg (07/21 0437) Pulse Rate: 66 (07/21 0437)  Labs:  Recent Labs  09/11/15 1456 09/11/15 1852 09/11/15 2316 09/12/15 0420  HGB 12.6  --   --  11.5*  HCT 36.3  --   --  33.9*  PLT 215  --   --  200  APTT 27  --   --   --   LABPROT 14.6  --   --   --   INR 1.12  --   --   --   HEPARINUNFRC  --   --  0.37 0.39  CREATININE 0.71  --   --  0.55  TROPONINI <0.03 <0.03 <0.03 <0.03    Estimated Creatinine Clearance: 104.7 mL/min (by C-G formula based on Cr of 0.55).   Medical History: Past Medical History  Diagnosis Date  . Hypertension   . Hyperlipidemia   . Heart murmur   . Mitral valve prolapse   . AV node dysfunction     tachycardia sees Dr. Lady GaryFath  . Sleep apnea     possible but not tested  . Cervical disc disease     degenerative disk   . Palpitations   . Anxiety   . IBS (irritable bowel syndrome)   . Chronic abdominal pain     Medications:  Prescriptions prior to admission  Medication Sig Dispense Refill Last Dose  . ALPRAZolam (XANAX) 0.5 MG tablet TAKE ONE TABLET BY MOUTH ONCE DAILY AS NEEDED 30 tablet 0 09/11/2015 at 1215  . hydrochlorothiazide (HYDRODIURIL) 12.5 MG tablet Take 1 tablet (12.5 mg total) by mouth daily. (Patient taking differently: Take 6.25 mg by mouth daily. ) 30 tablet 6 09/11/2015 at 0800  . metoprolol tartrate  (LOPRESSOR) 25 MG tablet Take 0.5 tablets (12.5 mg total) by mouth 2 (two) times daily. 30 tablet 6 09/11/2015 at 0800  . omeprazole (PRILOSEC) 40 MG capsule Take 1 capsule (40 mg total) by mouth daily. 30 capsule 5 09/10/2015 at 1830  . simvastatin (ZOCOR) 40 MG tablet TAKE ONE TABLET BY MOUTH ONCE DAILY 30 tablet 0 09/10/2015 at Unknown time    Assessment: Pharmacy consulted to dose heparin this 50 year old female admitted with ACS/NSTEMI. CrCl = 104.7 ml/min,  No prior anticoag noted.  Goal of Therapy:  Heparin level 0.3-0.7 units/ml Monitor platelets by anticoagulation protocol: Yes   Plan:  First heparin level therapeutic. Continue current rate. Recheck heparin level with AM labs.  Second heparin level therapeutic at 0.39. Continue current rate of 1200 units/hr. Pharmacy will continue to monitor HL and CBC daily.  Amariana Mirando A, Pharm.D., BCPS Clinical Pharmacist 09/12/2015,8:06 AM

## 2015-09-13 NOTE — Discharge Summary (Signed)
[facility] - [place] [facility]   PATIENT NAME: Sherri Matthews    MR#:  [id number]  DATE OF BIRTH:  June 02, 1965  DATE OF ADMISSION:  09/11/2015 ADMITTING PHYSICIAN: Enid Baas, MD  DATE OF DISCHARGE: 09/12/2015  4:32 PM  PRIMARY CARE PHYSICIAN: Elizabeth Sauer, MD   ADMISSION DIAGNOSIS:  Ischemic chest pain (HCC) [I20.9]  DISCHARGE DIAGNOSIS:  Active Problems:   Unstable angina (HCC)   SECONDARY DIAGNOSIS:   Past Medical History  Diagnosis Date  . Hypertension   . Hyperlipidemia   . Heart murmur   . Mitral valve prolapse   . AV node dysfunction     tachycardia sees Dr. Lady Gary  . Sleep apnea     possible but not tested  . Cervical disc disease     degenerative disk   . Palpitations   . Anxiety   . IBS (irritable bowel syndrome)   . Chronic abdominal pain      ADMITTING HISTORY  Sherri Matthews is a 50 y.o. female with a known history of Anxiety, hyperlipidemia, GERD, mitral valve prolapse and history of tachycardia presents to the hospital secondary to chest tightness. Symptoms started about a week ago when she was at the beach, had back pain between her shoulder blades. It resolved with the heating pad. As soon as she declined from her vacation, she's been experiencing tightness in her chest associated with difficulty breathing. She attribute it to her anxiety. However today she was by herself at home, started having chest tightness much worse than before and presented to the emergency room. Denies any nausea or vomiting. It is still associated with the feeling that she couldn't breathe. It eased off after 2 sublingual nitroglycerin tablets. Appears very anxious on exam. No fevers or chills. Family history, mother with coronary artery disease diagnosed in her 64s. She is being admitted to stepdown with nitroglycerin and heparin drips. EKG shows ST depressions in lateral leads. First troponin is negative.  HOSPITAL COURSE:   * Chest pain Patient  was treated with the nitro drip and heparin drip. Cardiac enzymes are normal. Patient had cardiac catheterization done by cardiology which showed normal coronaries. Her pain was thought to be likely due to GERD and her PPI is being double the dose for 2 weeks and patient will follow-up with her primary care physician.  Other medications remain unchanged.  Stable for discharge home.  CONSULTS OBTAINED:  Treatment Team:  Marcina Millard, MD  DRUG ALLERGIES:   Allergies  Allergen Reactions  . Oxycodone-Acetaminophen Itching  . Ciprofloxacin Nausea And Vomiting  . Fenofibrate     myalgia  . Sertraline Other (See Comments)    Pt prefers to never be on this again.    . Vicodin [Hydrocodone-Acetaminophen] Other (See Comments)    Heart raced.    DISCHARGE MEDICATIONS:   Discharge Medication List as of 09/12/2015  3:09 PM    CONTINUE these medications which have CHANGED   Details  omeprazole (PRILOSEC) 40 MG capsule Take 1 capsule (40 mg total) by mouth 2 (two) times daily., Starting 09/12/2015, Until Discontinued, Normal      CONTINUE these medications which have NOT CHANGED   Details  ALPRAZolam (XANAX) 0.5 MG tablet TAKE ONE TABLET BY MOUTH ONCE DAILY AS NEEDED, Print    hydrochlorothiazide (HYDRODIURIL) 12.5 MG tablet Take 1 tablet (12.5 mg total) by mouth daily., Starting 07/07/2015, Until Discontinued, Normal    metoprolol tartrate (LOPRESSOR) 25 MG tablet Take 0.5 tablets (12.5 mg total) by  mouth 2 (two) times daily., Starting 07/07/2015, Until Discontinued, Normal    simvastatin (ZOCOR) 40 MG tablet TAKE ONE TABLET BY MOUTH ONCE DAILY, Normal        Today   VITAL SIGNS:  Blood pressure 124/71, pulse 57, temperature 97.9 F (36.6 C), temperature source Oral, resp. rate 17, height  (1.803 m), weight 95.255 kg (210 lb), last menstrual period 08/17/2015, SpO2 99 %.  I/O:   Intake/Output Summary (Last 24 hours) at 09/13/15 1446 Last data filed at 09/12/15  1500  Gross per 24 hour  Intake 711.25 ml  Output      0 ml  Net 711.25 ml    PHYSICAL EXAMINATION:  Physical Exam  GENERAL:  50 y.o.-year-old patient lying in the bed with no acute distress.  LUNGS: Normal breath sounds bilaterally, no wheezing, rales,rhonchi or crepitation. No use of accessory muscles of respiration.  CARDIOVASCULAR: S1, S2 normal. No murmurs, rubs, or gallops.  ABDOMEN: Soft, non-tender, non-distended. Bowel sounds present. No organomegaly or mass.  NEUROLOGIC: Moves all 4 extremities. PSYCHIATRIC: The patient is alert and oriented x 3.  SKIN: No obvious rash, lesion, or ulcer.   DATA REVIEW:   CBC  Recent Labs Lab 09/12/15 0420  WBC 5.9  HGB 11.5*  HCT 33.9*  PLT 200    Chemistries   Recent Labs Lab 09/11/15 1456 09/12/15 0420  NA 137 142  K 3.0* 3.8  CL 104 111  CO2 23 25  GLUCOSE 120* 102*  BUN 11 10  CREATININE 0.71 0.55  CALCIUM 9.5 8.6*  AST 35  --   ALT 29  --   ALKPHOS 86  --   BILITOT 0.7  --     Cardiac Enzymes  Recent Labs Lab 09/12/15 1033  TROPONINI <0.03    Microbiology Results  Results for orders placed or performed during the hospital encounter of 04/09/15  Urine culture     Status: None   Collection Time: 04/09/15  5:34 PM  Result Value Ref Range Status   Specimen Description URINE, CLEAN CATCH  Final   Special Requests NONE  Final   Culture >=100,000 COLONIES/mL ESCHERICHIA COLI  Final   Report Status 04/11/2015 FINAL  Final   Organism ID, Bacteria ESCHERICHIA COLI  Final      Susceptibility   Escherichia coli - MIC*    AMPICILLIN <=2 SENSITIVE Sensitive     CEFAZOLIN <=4 SENSITIVE Sensitive     CEFTRIAXONE <=1 SENSITIVE Sensitive     CIPROFLOXACIN <=0.25 SENSITIVE Sensitive     GENTAMICIN <=1 SENSITIVE Sensitive     IMIPENEM <=0.25 SENSITIVE Sensitive     NITROFURANTOIN <=16 SENSITIVE Sensitive     TRIMETH/SULFA <=20 SENSITIVE Sensitive     AMPICILLIN/SULBACTAM <=2 SENSITIVE Sensitive     PIP/TAZO  <=4 SENSITIVE Sensitive     Extended ESBL NEGATIVE Sensitive     * >=100,000 COLONIES/mL ESCHERICHIA COLI    RADIOLOGY:  Dg Chest 2 View  09/11/2015  CLINICAL DATA:  Difficulty breathing today. EXAM: CHEST  2 VIEW COMPARISON:  PA and lateral chest 04/12/2014. FINDINGS: The lungs are clear. Heart size is normal. No pneumothorax or pleural effusion. No focal bony abnormality. IMPRESSION: No acute disease. Electronically Signed   By: Drusilla Kanner M.D.   On: 09/11/2015 15:53    Follow up with PCP in 1 week.  Management plans discussed with the patient, family and they are in agreement.  CODE STATUS:  Code Status History    Date Active Date  Inactive Code Status Order ID Comments User Context   09/11/2015 10:26 PM 09/12/2015  7:32 PM Full Code [id number]  Enid Baas, MD ED      TOTAL TIME TAKING CARE OF THIS PATIENT ON DAY OF DISCHARGE: more than 30 minutes.   Milagros Loll R M.D on 09/13/2015 at 2:46 PM  Between 7am to 6pm - Pager - [phone]  After 6pm go to [email or web address] - password [email or web address]  [facility]  Office  [phone]  CC: Primary care physician; Elizabeth Sauer, MD  Note: This dictation was prepared with Dragon dictation along with smaller phrase technology. Any transcriptional errors that result from this process are unintentional.

## 2015-09-24 DIAGNOSIS — R0602 Shortness of breath: Secondary | ICD-10-CM | POA: Insufficient documentation

## 2015-09-26 ENCOUNTER — Other Ambulatory Visit: Payer: Self-pay | Admitting: Family Medicine

## 2015-10-09 DIAGNOSIS — R002 Palpitations: Secondary | ICD-10-CM | POA: Insufficient documentation

## 2015-11-05 ENCOUNTER — Other Ambulatory Visit: Payer: Self-pay | Admitting: Neurology

## 2015-11-05 DIAGNOSIS — G43119 Migraine with aura, intractable, without status migrainosus: Secondary | ICD-10-CM

## 2015-11-09 ENCOUNTER — Other Ambulatory Visit: Payer: Self-pay | Admitting: Gastroenterology

## 2015-11-09 ENCOUNTER — Other Ambulatory Visit: Payer: Self-pay | Admitting: Family Medicine

## 2015-11-15 ENCOUNTER — Ambulatory Visit
Admission: RE | Admit: 2015-11-15 | Discharge: 2015-11-15 | Disposition: A | Discharge status: Home / Self Care | Payer: BC Managed Care – PPO | Source: Ambulatory Visit | Attending: Neurology | Admitting: Neurology

## 2015-11-15 DIAGNOSIS — G43119 Migraine with aura, intractable, without status migrainosus: Secondary | ICD-10-CM | POA: Diagnosis present

## 2015-11-17 ENCOUNTER — Other Ambulatory Visit: Payer: Self-pay | Admitting: Gastroenterology

## 2015-12-12 ENCOUNTER — Ambulatory Visit: Payer: BC Managed Care – PPO | Attending: Internal Medicine

## 2015-12-12 DIAGNOSIS — G4733 Obstructive sleep apnea (adult) (pediatric): Secondary | ICD-10-CM | POA: Diagnosis not present

## 2015-12-15 ENCOUNTER — Other Ambulatory Visit: Payer: Self-pay | Admitting: Family Medicine

## 2015-12-26 ENCOUNTER — Ambulatory Visit: Payer: BC Managed Care – PPO | Attending: Family Medicine

## 2015-12-26 DIAGNOSIS — R000 Tachycardia, unspecified: Secondary | ICD-10-CM | POA: Insufficient documentation

## 2015-12-26 DIAGNOSIS — G4733 Obstructive sleep apnea (adult) (pediatric): Secondary | ICD-10-CM | POA: Insufficient documentation

## 2016-01-09 ENCOUNTER — Other Ambulatory Visit: Payer: Self-pay | Admitting: Family Medicine

## 2016-01-13 ENCOUNTER — Other Ambulatory Visit: Payer: Self-pay | Admitting: Family Medicine

## 2016-01-28 ENCOUNTER — Other Ambulatory Visit: Payer: Self-pay | Admitting: *Deleted

## 2016-01-28 ENCOUNTER — Ambulatory Visit (INDEPENDENT_AMBULATORY_CARE_PROVIDER_SITE_OTHER): Payer: BC Managed Care – PPO | Admitting: Family Medicine

## 2016-01-28 VITALS — BP 128/86 | HR 60 | Temp 97.6°F | Ht 71.0 in | Wt 223.0 lb

## 2016-01-28 DIAGNOSIS — Z780 Asymptomatic menopausal state: Secondary | ICD-10-CM

## 2016-01-28 DIAGNOSIS — E782 Mixed hyperlipidemia: Secondary | ICD-10-CM | POA: Diagnosis not present

## 2016-01-28 DIAGNOSIS — I10 Essential (primary) hypertension: Secondary | ICD-10-CM | POA: Diagnosis not present

## 2016-01-28 DIAGNOSIS — Z23 Encounter for immunization: Secondary | ICD-10-CM

## 2016-01-28 DIAGNOSIS — K219 Gastro-esophageal reflux disease without esophagitis: Secondary | ICD-10-CM | POA: Diagnosis not present

## 2016-01-28 DIAGNOSIS — F064 Anxiety disorder due to known physiological condition: Secondary | ICD-10-CM | POA: Diagnosis not present

## 2016-01-28 MED ORDER — OMEPRAZOLE 40 MG PO CPDR: 40.0000 mg | DELAYED_RELEASE_CAPSULE | Freq: Every day | ORAL | 11 refills | Status: DC

## 2016-01-28 MED ORDER — ALPRAZOLAM 0.5 MG PO TABS: ORAL_TABLET | ORAL | 0 refills | Status: DC

## 2016-01-28 MED ORDER — HYDROCHLOROTHIAZIDE 12.5 MG PO TABS: 12.5000 mg | ORAL_TABLET | Freq: Every day | ORAL | 6 refills | Status: DC

## 2016-01-28 MED ORDER — METOPROLOL TARTRATE 25 MG PO TABS
12.5000 mg | ORAL_TABLET | Freq: Two times a day (BID) | ORAL | 6 refills | Status: DC
Start: 2016-01-28 — End: 2016-06-18

## 2016-01-28 MED ORDER — SIMVASTATIN 40 MG PO TABS: 40.0000 mg | ORAL_TABLET | Freq: Every day | ORAL | 6 refills | Status: DC

## 2016-01-28 NOTE — Progress Notes (Signed)
Name: Sherri Matthews   MRN: [id number]    DOB: 08-06-1965   Date:01/28/2016       Progress Note  Subjective  Chief Complaint  Chief Complaint  Patient presents with  . Medication Refill    refill with all meds    Hypertension  This is a chronic problem. The current episode started more than 1 year ago. The problem has been waxing and waning since onset. The problem is controlled. Pertinent negatives include no anxiety, blurred vision, chest pain, headaches, malaise/fatigue, neck pain, orthopnea, palpitations, peripheral edema, PND, shortness of breath or sweats. There are no associated agents to hypertension. Past treatments include diuretics and beta blockers. The current treatment provides no improvement. There are no compliance problems.  There is no history of angina, kidney disease, CAD/MI, CVA, heart failure, left ventricular hypertrophy, PVD, renovascular disease or retinopathy. There is no history of chronic renal disease or a hypertension causing med.  Hyperlipidemia  This is a chronic problem. The current episode started more than 1 year ago. The problem is controlled. Recent lipid tests were reviewed and are normal. She has no history of chronic renal disease. There are no known factors aggravating her hyperlipidemia. Pertinent negatives include no chest pain, focal sensory loss, focal weakness, leg pain, myalgias or shortness of breath. Current antihyperlipidemic treatment includes statins. The current treatment provides mild improvement of lipids. There are no compliance problems.  Risk factors for coronary artery disease include dyslipidemia, hypertension, post-menopausal and obesity.  Gastroesophageal Reflux  She complains of heartburn. She reports no abdominal pain, no belching, no chest pain, no coughing, no nausea, no sore throat or no wheezing. This is a chronic problem. The problem occurs occasionally. The symptoms are aggravated by certain foods. Pertinent negatives include  no melena or weight loss. She has tried a PPI for the symptoms.  Anxiety  Presents for follow-up visit. Patient reports no chest pain, dizziness, feeling of choking, insomnia, irritability, nausea, nervous/anxious behavior, palpitations, shortness of breath or suicidal ideas. Symptoms occur most days. The severity of symptoms is moderate. The quality of sleep is good.      No problem-specific Assessment & Plan notes found for this encounter.   Past Medical History:  Diagnosis Date  . Anxiety   . AV node dysfunction    tachycardia sees Dr. Lady GaryFath  . Cervical disc disease    degenerative disk   . Chronic abdominal pain   . Heart murmur   . Hyperlipidemia   . Hypertension   . IBS (irritable bowel syndrome)   . Mitral valve prolapse   . Palpitations   . Sleep apnea    possible but not tested    Past Surgical History:  Procedure Laterality Date  . APPENDECTOMY  2008  . CARDIAC CATHETERIZATION N/A 09/12/2015   Procedure: Left Heart Cath and Coronary Angiography;  Surgeon: Marcina MillardAlexander Paraschos, MD;  Location: [facility] INVASIVE CV LAB;  Service: Cardiovascular;  Laterality: N/A;  . ESOPHAGOGASTRODUODENOSCOPY (EGD) WITH PROPOFOL N/A 04/18/2015   Procedure: ESOPHAGOGASTRODUODENOSCOPY (EGD) WITH PROPOFOL;  Surgeon: Midge Miniumarren Wohl, MD;  [facility];  Service: Endoscopy;  Laterality: N/A;  . HERNIA REPAIR      Family History  Problem Relation Age of Onset  . Atrial fibrillation Mother   . Atrial fibrillation Father   . Hypertension Mother   . Diabetes Mother     Social History   Social History  . Marital status: Married    Spouse name: N/A  . Number of children: N/A  .  Years of education: N/A   Occupational History  . Not on file.   Social History Main Topics  . Smoking status: Never Smoker  . Smokeless tobacco: Never Used  . Alcohol use No  . Drug use: No  . Sexual activity: Not on file   Other Topics Concern  . Not on file   Social History Narrative    Lives at home and independent at baseline    Allergies  Allergen Reactions  . Oxycodone-Acetaminophen Itching  . Oxycodone-Acetaminophen Itching  . Ciprofloxacin Nausea And Vomiting  . Fenofibrate     myalgia  . Sertraline Other (See Comments)    Pt prefers to never be on this again.    . Vicodin [Hydrocodone-Acetaminophen] Other (See Comments)    Heart raced.     Review of Systems  Constitutional: Negative for chills, fever, irritability, malaise/fatigue and weight loss.  HENT: Negative for ear discharge, ear pain and sore throat.   Eyes: Negative for blurred vision.  Respiratory: Negative for cough, sputum production, shortness of breath and wheezing.   Cardiovascular: Negative for chest pain, palpitations, orthopnea, leg swelling and PND.  Gastrointestinal: Positive for heartburn. Negative for abdominal pain, blood in stool, constipation, diarrhea, melena and nausea.  Genitourinary: Negative for dysuria, frequency, hematuria and urgency.  Musculoskeletal: Negative for back pain, joint pain, myalgias and neck pain.  Skin: Negative for rash.  Neurological: Negative for dizziness, tingling, sensory change, focal weakness and headaches.  Endo/Heme/Allergies: Negative for environmental allergies and polydipsia. Does not bruise/bleed easily.  Psychiatric/Behavioral: Negative for depression and suicidal ideas. The patient is not nervous/anxious and does not have insomnia.      Objective  Vitals:   01/28/16 0929  BP: 128/86  Pulse: 60  Temp: 97.6 F (36.4 C)  SpO2: 98%  Weight: 223 lb (101.2 kg)  Height: 5\' 11"  (1.803 m)    Physical Exam  Constitutional: She is well-developed, well-nourished, and in no distress. No distress.  HENT:  Head: Normocephalic and atraumatic.  Right Ear: External ear normal.  Left Ear: External ear normal.  Nose: Nose normal.  Mouth/Throat: Oropharynx is clear and moist.  Eyes: Conjunctivae and EOM are normal. Pupils are equal, round, and  reactive to light. Right eye exhibits no discharge. Left eye exhibits no discharge.  Neck: Normal range of motion. Neck supple. No JVD present. No thyromegaly present.  Cardiovascular: Normal rate, regular rhythm, normal heart sounds and intact distal pulses.  Exam reveals no gallop and no friction rub.   No murmur heard. Pulmonary/Chest: Effort normal and breath sounds normal. She has no wheezes. She has no rales.  Abdominal: Soft. Bowel sounds are normal. She exhibits no mass. There is no tenderness. There is no guarding.  Musculoskeletal: Normal range of motion. She exhibits no edema.  Lymphadenopathy:    She has no cervical adenopathy.  Neurological: She is alert. She has normal reflexes.  Skin: Skin is warm and dry. No rash noted. She is not diaphoretic. No erythema.  Psychiatric: Mood and affect normal.  Nursing note and vitals reviewed.     Assessment & Plan  Problem List Items Addressed This Visit      Cardiovascular and Mediastinum   Essential (primary) hypertension   Relevant Medications   ALPRAZolam (XANAX) 0.5 MG tablet   hydrochlorothiazide (HYDRODIURIL) 12.5 MG tablet   metoprolol tartrate (LOPRESSOR) 25 MG tablet   omeprazole (PRILOSEC) 40 MG capsule   simvastatin (ZOCOR) 40 MG tablet     Digestive   Gastroesophageal reflux disease  Relevant Medications   omeprazole (PRILOSEC) 40 MG capsule     Nervous and Auditory   Anxiety disorder due to known physiological condition     Other   Hyperlipidemia   Relevant Medications   hydrochlorothiazide (HYDRODIURIL) 12.5 MG tablet   metoprolol tartrate (LOPRESSOR) 25 MG tablet   simvastatin (ZOCOR) 40 MG tablet   Other Relevant Orders   Lipid Profile    Other Visit Diagnoses    Menopause    -  Primary   Relevant Orders   FSH/LH        Dr. Hayden [facility]  01/28/16

## 2016-01-29 LAB — LIPID PANEL
CHOL/HDL RATIO: 4.2 ratio (ref 0.0–4.4)
Cholesterol, Total: 186 mg/dL (ref 100–199)
HDL: 44 mg/dL (ref >39)
LDL CALC: 106 mg/dL — AB (ref 0–99)
TRIGLYCERIDES: 178 mg/dL — AB (ref 0–149)
VLDL Cholesterol Cal: 36 mg/dL (ref 5–40)

## 2016-01-29 LAB — RENAL FUNCTION PANEL
ALBUMIN: 3.9 g/dL (ref 3.5–5.5)
BUN/Creatinine Ratio: 21 (ref 9–23)
BUN: 12 mg/dL (ref 6–24)
CO2: 25 mmol/L (ref 18–29)
Calcium: 9.3 mg/dL (ref 8.7–10.2)
Chloride: 98 mmol/L (ref 96–106)
Creatinine, Ser: 0.57 mg/dL (ref 0.57–1.00)
GFR, EST AFRICAN AMERICAN: 123 mL/min/{1.73_m2} (ref >59)
GFR, EST NON AFRICAN AMERICAN: 107 mL/min/{1.73_m2} (ref >59)
GLUCOSE: 86 mg/dL (ref 65–99)
POTASSIUM: 4.3 mmol/L (ref 3.5–5.2)
Phosphorus: 3.6 mg/dL (ref 2.5–4.5)
Sodium: 139 mmol/L (ref 134–144)

## 2016-01-29 LAB — FSH/LH
FSH: 4.2 m[IU]/mL
LH: 8.8 m[IU]/mL

## 2016-02-01 ENCOUNTER — Ambulatory Visit
Admission: EM | Admit: 2016-02-01 | Discharge: 2016-02-01 | Disposition: A | Discharge status: Home / Self Care | Payer: BC Managed Care – PPO | Attending: Family Medicine | Admitting: Family Medicine

## 2016-02-01 DIAGNOSIS — J020 Streptococcal pharyngitis: Secondary | ICD-10-CM | POA: Diagnosis not present

## 2016-02-01 LAB — RAPID STREP SCREEN (MED CTR MEBANE ONLY): STREPTOCOCCUS, GROUP A SCREEN (DIRECT): POSITIVE — AB

## 2016-02-01 MED ORDER — PENICILLIN G BENZATHINE 1200000 UNIT/2ML IM SUSP
1.2000 10*6.[IU] | Freq: Once | INTRAMUSCULAR | Status: AC
  Administered 2016-02-01: 1.2 10*6.[IU] via INTRAMUSCULAR

## 2016-02-01 NOTE — ED Provider Notes (Signed)
[facility]    CSN: [id number] Arrival date & time: 02/01/16  0807     History   Chief Complaint Chief Complaint  Patient presents with  . Sore Throat    HPI Sherri Matthews is a 50 y.o. female.   Action is a 50 year old white female states she was fine until yesterday afternoon when she started coming down sore throat nasal congestion right ear pain gel malaise fever and chills. She states stitches she gets strep quite often and this felt like strep. 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 She reports last flu shot was last April when [facility] she was also informed that flu shot she got in April was just to cover her for the lashes flu season and that she needs a new flu shot this year. Past smoker history had multiple medical problems anxiety AV function with tachycardia she's has cervical disc disease heart murmur hyperlipidemia hypertension IBS mitral valve prolapse palpitations sleep apnea. She's had appendectomy cardiac catheterization upper endoscopy and hernia repair. Portion she never smoked and she looks oxycodone Cipro, fenofibrate's and Vicodin and sertariline.   The history is provided by the patient. No language interpreter was used.  Sore Throat  This is a new problem. The current episode started yesterday. The problem occurs constantly. The problem has been gradually worsening. Pertinent negatives include no chest pain, no abdominal pain and no headaches. Nothing aggravates the symptoms. Nothing relieves the symptoms. She has tried acetaminophen for the symptoms. The treatment provided no relief.    Past Medical History:  Diagnosis Date  . Anxiety   . AV node dysfunction    tachycardia sees Dr. Lady GaryFath  . Cervical disc disease    degenerative disk   . Chronic abdominal pain   . Heart murmur   . Hyperlipidemia   . Hypertension   . IBS  (irritable bowel syndrome)   . Mitral valve prolapse   . Palpitations   . Sleep apnea    possible but not tested    Patient Active Problem List   Diagnosis Date Noted  . Menopause 01/28/2016  . Unstable angina (HCC) 09/11/2015  . Essential hypertension 05/15/2015  . Hyperlipidemia 05/15/2015  . Abdominal pain, right upper quadrant   . Abdominal pain, left upper quadrant   . Gastritis   . 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 Routine general medical examination at a health care facility 07/16/2014  . 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 Essential (primary) hypertension 07/16/2014  . Heart murmur 07/16/2014  . Gastroesophageal reflux disease 07/16/2014  . H/O: osteoarthritis 07/16/2014    Past Surgical History:  Procedure Laterality Date  . APPENDECTOMY  2008  . CARDIAC CATHETERIZATION N/A 09/12/2015   Procedure: Left Heart Cath and Coronary Angiography;  Surgeon: Marcina MillardAlexander Paraschos, MD;  Location: [facility] INV[facility];  Service: Cardiovascular;  Laterality: N/A;  . ESOPHAGOGASTRODUODENOSCOPY (EGD) WITH PROPOFOL N/A 04/18/2015   Procedure: ESOPHAGOGASTRODUODENOSCOPY (EGD) WITH PROPOFOL;  Surgeon: Midge Miniumarren Wohl, MD;  [facility];  Service: Endoscopy;  Laterality: N/A;  . HERNIA REPAIR      OB History    No data available       Home Medications    Prior to Admission medications   Medication Sig Start Date End Date Taking? Authorizing Provider  ALPRAZolam (  XANAX) 0.5 MG tablet TAKE ONE TABLET BY MOUTH ONCE DAILY AS NEEDED 01/28/16  Yes Duanne Limerickeanna C Jones, MD  Cyanocobalamin (B-12) 1000 MCG CAPS Take by mouth.   Yes Historical Provider, MD  hydrochlorothiazide (HYDRODIURIL) 12.5 MG tablet Take 1 tablet (12.5 mg total) by mouth daily. 01/28/16  Yes Duanne Limerickeanna C Jones, MD  magnesium oxide (MAG-OX) 400 MG tablet Take 400 mg by mouth daily.   Yes Historical  Provider, MD  metoprolol tartrate (LOPRESSOR) 25 MG tablet Take 0.5 tablets (12.5 mg total) by mouth 2 (two) times daily. 01/28/16  Yes Duanne Limerickeanna C Jones, MD  omeprazole (PRILOSEC) 40 MG capsule Take 1 capsule (40 mg total) by mouth 2 (two) times daily. 09/12/15  Yes Srikar Sudini, MD  omeprazole (PRILOSEC) 40 MG capsule Take 1 capsule (40 mg total) by mouth daily. 01/28/16  Yes Duanne Limerickeanna C Jones, MD  simvastatin (ZOCOR) 40 MG tablet Take 1 tablet (40 mg total) by mouth daily. 01/28/16  Yes Duanne Limerickeanna C Jones, MD  Vitamin D, Ergocalciferol, (DRISDOL) 50000 units CAPS capsule  10/31/15  Yes Historical Provider, MD    Family History Family History  Problem Relation Age of Onset  . Atrial fibrillation Mother   . Hypertension Mother   . Diabetes Mother   . Atrial fibrillation Father     Social History Social History  Substance Use Topics  . Smoking status: Never Smoker  . Smokeless tobacco: Never Used  . Alcohol use No     Allergies   Oxycodone-acetaminophen; Oxycodone-acetaminophen; Ciprofloxacin; Fenofibrate; Sertraline; and Vicodin [hydrocodone-acetaminophen]   Review of Systems Review of Systems  Constitutional: Positive for chills and fever.  HENT: Positive for ear pain, postnasal drip, rhinorrhea and sore throat.   Cardiovascular: Negative for chest pain.  Gastrointestinal: Negative for abdominal pain.  Musculoskeletal: Positive for myalgias.  Neurological: Negative for headaches.  All other systems reviewed and are negative.    Physical Exam Triage Vital Signs ED Triage Vitals  Enc Vitals Group     BP 02/01/16 0836 (!) 151/77     Pulse Rate 02/01/16 0836 69     Resp 02/01/16 0836 17     Temp 02/01/16 0836 98.8 F (37.1 C)     Temp Source 02/01/16 0836 Oral     SpO2 02/01/16 0836 100 %     Weight 02/01/16 0833 223 lb (101.2 kg)     Height 02/01/16 0833 5\' 11"  (1.803 m)     Head Circumference --      Peak Flow --      Pain Score 02/01/16 0836 6     Pain Loc --      Pain  Edu? --      Excl. in GC? --    No data found.   Updated Vital Signs BP (!) 151/77 (BP Location: Left Arm)   Pulse 69   Temp 98.8 F (37.1 C) (Oral)   Resp 17   Ht 5\' 11"  (1.803 m)   Wt 223 lb (101.2 kg)   LMP 09/23/2015   SpO2 100%   BMI 31.10 kg/m   Visual Acuity Right Eye Distance:   Left Eye Distance:   Bilateral Distance:    Right Eye Near:   Left Eye Near:    Bilateral Near:     Physical Exam  Constitutional: She is oriented to person, place, and time. She appears well-developed and well-nourished.  HENT:  Head: Normocephalic and atraumatic.  Right Ear: Hearing, external ear and ear canal normal. Tympanic membrane is bulging.  Left Ear: Hearing, tympanic membrane, external ear and ear canal normal.  Nose: Mucosal edema present.  Mouth/Throat: Uvula is midline. Posterior oropharyngeal edema and posterior oropharyngeal erythema present.  Eyes: EOM are normal. Pupils are equal, round, and reactive to light.  Neck: Normal range of motion.  Cardiovascular: Normal rate.   Pulmonary/Chest: Effort normal.  Musculoskeletal: Normal range of motion.  Lymphadenopathy:    She has cervical adenopathy.  Neurological: She is alert and oriented to person, place, and time.  Skin: Skin is warm.  Psychiatric: She has a normal mood and affect.  Vitals reviewed.    UC Treatments / Results  Labs (all labs ordered are listed, but only abnormal results are displayed) Labs Reviewed  RAPID STREP SCREEN (NOT AT [facility]) - Abnormal; Notable for the following:       Result Value   Streptococcus, Group A Screen (Direct) POSITIVE (*)    All other components within normal limits    EKG  EKG Interpretation None       Radiology No results found.  Procedures Procedures (including critical care time)  Medications Ordered in UC Medications  penicillin g benzathine (BICILLIN LA) 1200000 UNIT/2ML injection 1.2 Million Units (1.2 Million Units Intramuscular Given 02/01/16 0914)       Initial Impression / Assessment and Plan / UC Course  I have reviewed the triage vital signs and the nursing notes.  Pertinent labs & imaging results that were available during my care of the patient were reviewed by me and considered in my medical decision making (see chart for details).    Results for orders placed or performed during the hospital encounter of 02/01/16  Rapid strep screen  Result Value Ref Range   Streptococcus, Group A Screen (Direct) POSITIVE (A) NEGATIVE   Clinical Course     Patient has mentioned several times as well with antibiotics offered her and she accepted LA Bicillin 1.2 milliunits IM. Will give a note for work for tomorrow as well and have her follow-up with Dr. Yetta Barre in 2 weeks as needed if this any problems or for proof of cure  Final Clinical Impressions(s) / UC Diagnoses   Final diagnoses:  Strep throat    New Prescriptions New Prescriptions   No medications on file    Note: This dictation was prepared with Dragon dictation along with smaller phrase technology. Any transcriptional errors that result from this process are unintentional.   Hassan Rowan, MD 02/01/16 [phone]

## 2016-02-01 NOTE — ED Triage Notes (Signed)
Patient complains of sore throat that started yesterday. 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 Patient reports some congestion and headaches with painful swallowing.

## 2016-02-04 ENCOUNTER — Telehealth: Payer: Self-pay

## 2016-02-04 NOTE — Telephone Encounter (Signed)
Courtesy call back completed today for patient's recent visit at [facility]. Patient did not answer, left message on machine to call back with any questions or concerns.   

## 2016-04-11 ENCOUNTER — Other Ambulatory Visit: Payer: Self-pay | Admitting: Family Medicine

## 2016-04-11 DIAGNOSIS — I10 Essential (primary) hypertension: Secondary | ICD-10-CM

## 2016-04-12 ENCOUNTER — Other Ambulatory Visit: Payer: Self-pay | Admitting: Family Medicine

## 2016-04-12 DIAGNOSIS — I10 Essential (primary) hypertension: Secondary | ICD-10-CM

## 2016-04-27 ENCOUNTER — Telehealth: Payer: Self-pay

## 2016-04-27 NOTE — Telephone Encounter (Signed)
Pt wants a mammogram scheduled- needs breast exam first. Ask her if she needs to be sched for a complete physical or does she just want the mammogram. If just mammo- she can be put in a reg slot- please call her

## 2016-04-30 NOTE — Telephone Encounter (Signed)
Nothing noted

## 2016-05-04 ENCOUNTER — Ambulatory Visit: Payer: BC Managed Care – PPO | Admitting: Family Medicine

## 2016-05-06 ENCOUNTER — Ambulatory Visit: Payer: BC Managed Care – PPO | Admitting: Family Medicine

## 2016-06-02 ENCOUNTER — Ambulatory Visit
Admission: EM | Admit: 2016-06-02 | Discharge: 2016-06-02 | Disposition: A | Discharge status: Home / Self Care | Payer: BC Managed Care – PPO | Attending: Family Medicine | Admitting: Family Medicine

## 2016-06-02 DIAGNOSIS — R1032 Left lower quadrant pain: Secondary | ICD-10-CM

## 2016-06-02 LAB — URINALYSIS, COMPLETE (UACMP) WITH MICROSCOPIC
BILIRUBIN URINE: NEGATIVE
GLUCOSE, UA: NEGATIVE mg/dL
Ketones, ur: NEGATIVE mg/dL
LEUKOCYTES UA: NEGATIVE
Nitrite: NEGATIVE
PROTEIN: NEGATIVE mg/dL
Specific Gravity, Urine: 1.02 (ref 1.005–1.030)
pH: 5.5 (ref 5.0–8.0)

## 2016-06-02 MED ORDER — AMOXICILLIN-POT CLAVULANATE 875-125 MG PO TABS: 1.0000 | ORAL_TABLET | Freq: Two times a day (BID) | ORAL | 0 refills | Status: DC

## 2016-06-02 MED ORDER — ONDANSETRON 8 MG PO TBDP: 8.0000 mg | ORAL_TABLET | Freq: Three times a day (TID) | ORAL | 0 refills | Status: DC | PRN

## 2016-06-02 NOTE — ED Provider Notes (Signed)
[facility]    CSN: [id number] Arrival date & time: 06/02/16  0910     History   Chief Complaint Chief Complaint  Patient presents with  . Abdominal Pain  . Back Pain    HPI Sherri Matthews is a 51 y.o. female.   The history is provided by the patient.  Abdominal Pain  Pain location:  LLQ Pain quality: aching   Pain radiates to:  Does not radiate Onset quality:  Gradual Duration:  7 days Timing:  Constant Progression:  Worsening Chronicity:  Recurrent (reports h/o diverticulitis in the past (last episode was one year ago)) Context: not alcohol use, not awakening from sleep, not diet changes, not eating, not laxative use, not medication withdrawal, not previous surgeries, not recent illness, not recent sexual activity, not recent travel, not retching, not sick contacts, not suspicious food intake and not trauma   Relieved by:  Nothing Ineffective treatments:  OTC medications Associated symptoms: constipation and nausea   Associated symptoms: no anorexia, no belching, no chest pain, no chills, no cough, no diarrhea, no dysuria, no fatigue, no fever, no flatus, no hematemesis, no hematochezia, no hematuria, no melena, no shortness of breath, no sore throat, no vaginal bleeding, no vaginal discharge and no vomiting   Risk factors: no alcohol abuse, not elderly, has not had multiple surgeries and no recent hospitalization   Back Pain  Associated symptoms: abdominal pain   Associated symptoms: no chest pain, no dysuria and no fever     Past Medical History:  Diagnosis Date  . Anxiety   . AV node dysfunction    tachycardia sees Dr. Lady Gary  . Cervical disc disease    degenerative disk   . Chronic abdominal pain   . Heart murmur   . Hyperlipidemia   . Hypertension   . IBS (irritable bowel syndrome)   . Mitral valve prolapse   . Palpitations   . Sleep apnea    possible but not tested    Patient Active Problem List   Diagnosis Date Noted  . Menopause  01/28/2016  . Unstable angina (HCC) 09/11/2015  . Essential hypertension 05/15/2015  . Hyperlipidemia 05/15/2015  . Abdominal pain, right upper quadrant   . Abdominal pain, left upper quadrant   . Gastritis   . 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 Routine general medical examination at a health care facility 07/16/2014  . 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 Essential (primary) hypertension 07/16/2014  . Heart murmur 07/16/2014  . Gastroesophageal reflux disease 07/16/2014  . H/O: osteoarthritis 07/16/2014    Past Surgical History:  Procedure Laterality Date  . APPENDECTOMY  2008  . CARDIAC CATHETERIZATION N/A 09/12/2015   Procedure: Left Heart Cath and Coronary Angiography;  Surgeon: Marcina Millard, MD;  Location: [facility];  Service: Cardiovascular;  Laterality: N/A;  . ESOPHAGOGASTRODUODENOSCOPY (EGD) WITH PROPOFOL N/A 04/18/2015   Procedure: ESOPHAGOGASTRODUODENOSCOPY (EGD) WITH PROPOFOL;  Surgeon: Midge Minium, MD;  Location: [facility];  Service: Endoscopy;  Laterality: N/A;  . HERNIA REPAIR      OB History    No data available       Home Medications    Prior to Admission medications   Medication Sig Start Date End Date Taking? Authorizing Provider  ALPRAZolam Prudy Feeler) 0.5 MG tablet TAKE ONE TABLET BY MOUTH ONCE DAILY AS NEEDED 04/12/16  Yes Duanne Limerick, MD  Cyanocobalamin (B-12) 1000 MCG CAPS  Take by mouth.   Yes Historical Provider, MD  ferrous sulfate 325 (65 FE) MG tablet Take 325 mg by mouth daily with breakfast.   Yes Historical Provider, MD  hydrochlorothiazide (HYDRODIURIL) 12.5 MG tablet Take 1 tablet (12.5 mg total) by mouth daily. 01/28/16  Yes Duanne Limerick, MD  magnesium oxide (MAG-OX) 400 MG tablet Take 400 mg by mouth daily.   Yes Historical Provider, MD  metoprolol tartrate (LOPRESSOR) 25 MG tablet Take 0.5 tablets  (12.5 mg total) by mouth 2 (two) times daily. 01/28/16  Yes Duanne Limerick, MD  omeprazole (PRILOSEC) 40 MG capsule Take 1 capsule (40 mg total) by mouth 2 (two) times daily. 09/12/15  Yes Srikar Sudini, MD  potassium chloride (K-DUR) 10 MEQ tablet Take 10 mEq by mouth daily.   Yes Historical Provider, MD  simvastatin (ZOCOR) 40 MG tablet Take 1 tablet (40 mg total) by mouth daily. 01/28/16  Yes Duanne Limerick, MD  Vitamin D, Ergocalciferol, (DRISDOL) 50000 units CAPS capsule  10/31/15  Yes Historical Provider, MD  amoxicillin-clavulanate (AUGMENTIN) 875-125 MG tablet Take 1 tablet by mouth 2 (two) times daily. 06/02/16   Payton Mccallum, MD  omeprazole (PRILOSEC) 40 MG capsule Take 1 capsule (40 mg total) by mouth daily. 01/28/16   Duanne Limerick, MD  ondansetron (ZOFRAN ODT) 8 MG disintegrating tablet Take 1 tablet (8 mg total) by mouth every 8 (eight) hours as needed. 06/02/16   Payton Mccallum, MD    Family History Family History  Problem Relation Age of Onset  . Atrial fibrillation Mother   . Hypertension Mother   . Diabetes Mother   . Atrial fibrillation Father     Social History Social History  Substance Use Topics  . Smoking status: Never Smoker  . Smokeless tobacco: Never Used  . Alcohol use No     Allergies   Oxycodone-acetaminophen; Oxycodone-acetaminophen; Ciprofloxacin; Fenofibrate; Sertraline; and Vicodin [hydrocodone-acetaminophen]   Review of Systems Review of Systems  Constitutional: Negative for chills, fatigue and fever.  HENT: Negative for sore throat.   Respiratory: Negative for cough and shortness of breath.   Cardiovascular: Negative for chest pain.  Gastrointestinal: Positive for abdominal pain, constipation and nausea. Negative for anorexia, diarrhea, flatus, hematemesis, hematochezia, melena and vomiting.  Genitourinary: Negative for dysuria, hematuria, vaginal bleeding and vaginal discharge.  Musculoskeletal: Positive for back pain.     Physical Exam Triage  Vital Signs ED Triage Vitals  Enc Vitals Group     BP 06/02/16 0935 135/77     Pulse Rate 06/02/16 0935 73     Resp 06/02/16 0935 16     Temp 06/02/16 0935 98.6 F (37 C)     Temp Source 06/02/16 0935 Oral     SpO2 06/02/16 0935 100 %     Weight 06/02/16 0934 210 lb (95.3 kg)     Height 06/02/16 0934  (1.803 m)     Head Circumference --      Peak Flow --      Pain Score 06/02/16 0934 6     Pain Loc --      Pain Edu? --      Excl. in GC? --    No data found.   Updated Vital Signs BP 135/77 (BP Location: Left Arm)   Pulse 73   Temp 98.6 F (37 C) (Oral)   Resp 16   Ht  (1.803 m)   Wt 210 lb (95.3 kg)   SpO2 100%   BMI  29.29 kg/m   Visual Acuity Right Eye Distance:   Left Eye Distance:   Bilateral Distance:    Right Eye Near:   Left Eye Near:    Bilateral Near:     Physical Exam  Constitutional: She appears well-developed and well-nourished. No distress.  Abdominal: Soft. Bowel sounds are normal. She exhibits no distension and no mass. There is tenderness (left lower quadrant; no rebound or guarding). There is no rebound and no guarding.  Skin: She is not diaphoretic.  Nursing note and vitals reviewed.    UC Treatments / Results  Labs (all labs ordered are listed, but only abnormal results are displayed) Labs Reviewed  URINALYSIS, COMPLETE (UACMP) WITH MICROSCOPIC - Abnormal; Notable for the following:       Result Value   Color, Urine STRAW (*)    Hgb urine dipstick TRACE (*)    Squamous Epithelial / LPF 0-5 (*)    Bacteria, UA RARE (*)    All other components within normal limits    EKG  EKG Interpretation None       Radiology No results found.  Procedures Procedures (including critical care time)  Medications Ordered in UC Medications - No data to display   Initial Impression / Assessment and Plan / UC Course  I have reviewed the triage vital signs and the nursing notes.  Pertinent labs & imaging results that were  available during my care of the patient were reviewed by me and considered in my medical decision making (see chart for details).       Final Clinical Impressions(s) / UC Diagnoses   Final diagnoses:  Left lower quadrant pain  (possible early/mild diverticulitis)  New Prescriptions Discharge Medication List as of 06/02/2016 10:16 AM    START taking these medications   Details  amoxicillin-clavulanate (AUGMENTIN) 875-125 MG tablet Take 1 tablet by mouth 2 (two) times daily., Starting Wed 06/02/2016, Normal    ondansetron (ZOFRAN ODT) 8 MG disintegrating tablet Take 1 tablet (8 mg total) by mouth every 8 (eight) hours as needed., Starting Wed 06/02/2016, Normal       1. diagnosis reviewed with patient 2. rx as per orders above; reviewed possible side effects, interactions, risks and benefits; empiric treatment 3. Recommend supportive treatment with clear liquids/increased fluids, then advance slowly as tolerated 4. Follow-up prn if symptoms worsen or don't improve   Payton Mccallum, MD 06/02/16 1145

## 2016-06-02 NOTE — ED Triage Notes (Signed)
Patient complains of left lower abdominal pain that started last week. Patient reports that she was in the hospital last year for diverticulitis. Patient states that she has also noticed some back pain as well. Patient states that she has noticed some body aches.

## 2016-06-12 ENCOUNTER — Other Ambulatory Visit: Payer: Self-pay | Admitting: Family Medicine

## 2016-06-12 DIAGNOSIS — I10 Essential (primary) hypertension: Secondary | ICD-10-CM

## 2016-06-17 ENCOUNTER — Other Ambulatory Visit: Payer: Self-pay

## 2016-06-18 ENCOUNTER — Ambulatory Visit (INDEPENDENT_AMBULATORY_CARE_PROVIDER_SITE_OTHER): Payer: BC Managed Care – PPO | Admitting: Family Medicine

## 2016-06-18 ENCOUNTER — Encounter: Payer: Self-pay | Admitting: Family Medicine

## 2016-06-18 VITALS — BP 110/70 | HR 70 | Ht 71.0 in | Wt 212.0 lb

## 2016-06-18 DIAGNOSIS — I10 Essential (primary) hypertension: Secondary | ICD-10-CM

## 2016-06-18 DIAGNOSIS — F064 Anxiety disorder due to known physiological condition: Secondary | ICD-10-CM | POA: Diagnosis not present

## 2016-06-18 DIAGNOSIS — K219 Gastro-esophageal reflux disease without esophagitis: Secondary | ICD-10-CM

## 2016-06-18 MED ORDER — ALPRAZOLAM 0.5 MG PO TABS: ORAL_TABLET | ORAL | 1 refills | Status: DC

## 2016-06-18 MED ORDER — OMEPRAZOLE 40 MG PO CPDR: 40.0000 mg | DELAYED_RELEASE_CAPSULE | Freq: Two times a day (BID) | ORAL | 1 refills | Status: DC

## 2016-06-18 MED ORDER — METOPROLOL TARTRATE 25 MG PO TABS: 12.5000 mg | ORAL_TABLET | Freq: Two times a day (BID) | ORAL | 1 refills | Status: DC

## 2016-06-18 MED ORDER — HYDROCHLOROTHIAZIDE 12.5 MG PO TABS: 12.5000 mg | ORAL_TABLET | Freq: Every day | ORAL | 1 refills | Status: DC

## 2016-06-18 NOTE — Progress Notes (Signed)
Name: Sherri Matthews   MRN: [id number]    DOB: October 05, 1965   Date:06/18/2016       Progress Note  Subjective  Chief Complaint  Chief Complaint  Patient presents with  . Hypertension  . Hyperlipidemia  . Anxiety    Hypertension  This is a chronic problem. The current episode started more than 1 year ago. The problem is unchanged. The problem is controlled. Associated symptoms include anxiety. Pertinent negatives include no blurred vision, chest pain, headaches, malaise/fatigue, neck pain, orthopnea, palpitations, peripheral edema, PND, shortness of breath or sweats. There are no associated agents to hypertension. There are no known risk factors for coronary artery disease. Past treatments include diuretics and beta blockers. The current treatment provides mild improvement. There are no compliance problems.  There is no history of angina, kidney disease, CAD/MI, CVA, heart failure, left ventricular hypertrophy, PVD or retinopathy. There is no history of chronic renal disease, a hypertension causing med or renovascular disease.  Hyperlipidemia  This is a chronic problem. The problem is controlled. Recent lipid tests were reviewed and are normal. Exacerbating diseases include obesity. She has no history of chronic renal disease, diabetes, hypothyroidism, liver disease or nephrotic syndrome. Factors aggravating her hyperlipidemia include thiazides. Pertinent negatives include no chest pain, focal sensory loss, focal weakness, leg pain, myalgias or shortness of breath. Current antihyperlipidemic treatment includes statins. The current treatment provides moderate improvement of lipids. There are no compliance problems.   Anxiety  Presents for follow-up visit. Symptoms include excessive worry, nervous/anxious behavior and panic. Patient reports no chest pain, compulsions, confusion, decreased concentration, depressed mood, dizziness, dry mouth, feeling of choking, hyperventilation, impotence, insomnia,  irritability, malaise, muscle tension, nausea, obsessions, palpitations, restlessness, shortness of breath or suicidal ideas.      No problem-specific Assessment & Plan notes found for this encounter.   Past Medical History:  Diagnosis Date  . Anxiety   . AV node dysfunction    tachycardia sees Dr. Lady Gary  . Cervical disc disease    degenerative disk   . Chronic abdominal pain   . Heart murmur   . Hyperlipidemia   . Hypertension   . IBS (irritable bowel syndrome)   . Mitral valve prolapse   . Palpitations   . Sleep apnea    possible but not tested    Past Surgical History:  Procedure Laterality Date  . APPENDECTOMY  2008  . CARDIAC CATHETERIZATION N/A 09/12/2015   Procedure: Left Heart Cath and Coronary Angiography;  Surgeon: Marcina Millard, MD;  Location: [facility] INVASIVE CV LAB;  Service: Cardiovascular;  Laterality: N/A;  . ESOPHAGOGASTRODUODENOSCOPY (EGD) WITH PROPOFOL N/A 04/18/2015   Procedure: ESOPHAGOGASTRODUODENOSCOPY (EGD) WITH PROPOFOL;  Surgeon: Midge Minium, MD;  Location: [facility];  Service: Endoscopy;  Laterality: N/A;  . HERNIA REPAIR      Family History  Problem Relation Age of Onset  . Atrial fibrillation Mother   . Hypertension Mother   . Diabetes Mother   . Atrial fibrillation Father     Social History   Social History  . Marital status: Married    Spouse name: N/A  . Number of children: N/A  . Years of education: N/A   Occupational History  . Not on file.   Social History Main Topics  . Smoking status: Never Smoker  . Smokeless tobacco: Never Used  . Alcohol use No  . Drug use: No  . Sexual activity: Not on file   Other Topics Concern  . Not on file  Social History Narrative   Lives at home and independent at baseline    Allergies  Allergen Reactions  . Oxycodone-Acetaminophen Itching  . Oxycodone-Acetaminophen Itching  . Ciprofloxacin Nausea And Vomiting  . Fenofibrate     myalgia  . Sertraline Other (See  Comments)    Pt prefers to never be on this again.    . Vicodin [Hydrocodone-Acetaminophen] Other (See Comments)    Heart raced.    Outpatient Medications Prior to Visit  Medication Sig Dispense Refill  . Cyanocobalamin (B-12) 1000 MCG CAPS Take by mouth.    . ferrous sulfate 325 (65 FE) MG tablet Take 325 mg by mouth daily with breakfast.    . simvastatin (ZOCOR) 40 MG tablet Take 1 tablet (40 mg total) by mouth daily. 30 tablet 6  . ALPRAZolam (XANAX) 0.5 MG tablet TAKE ONE TABLET BY MOUTH ONCE DAILY AS NEEDED 30 tablet 0  . hydrochlorothiazide (HYDRODIURIL) 12.5 MG tablet Take 1 tablet (12.5 mg total) by mouth daily. (Patient taking differently: Take 6.25 mg by mouth daily. ) 30 tablet 6  . magnesium oxide (MAG-OX) 400 MG tablet Take 400 mg by mouth daily.    . metoprolol tartrate (LOPRESSOR) 25 MG tablet Take 0.5 tablets (12.5 mg total) by mouth 2 (two) times daily. 30 tablet 6  . omeprazole (PRILOSEC) 40 MG capsule Take 1 capsule (40 mg total) by mouth 2 (two) times daily. (Patient taking differently: Take 40 mg by mouth daily. ) 60 capsule 0  . Vitamin D, Ergocalciferol, (DRISDOL) 50000 units CAPS capsule     . ondansetron (ZOFRAN ODT) 8 MG disintegrating tablet Take 1 tablet (8 mg total) by mouth every 8 (eight) hours as needed. (Patient not taking: Reported on 06/18/2016) 6 tablet 0  . amoxicillin-clavulanate (AUGMENTIN) 875-125 MG tablet Take 1 tablet by mouth 2 (two) times daily. 20 tablet 0  . omeprazole (PRILOSEC) 40 MG capsule Take 1 capsule (40 mg total) by mouth daily. 30 capsule 11  . potassium chloride (K-DUR) 10 MEQ tablet Take 10 mEq by mouth daily.     No facility-administered medications prior to visit.     Review of Systems  Constitutional: Negative for chills, fever, irritability, malaise/fatigue and weight loss.  HENT: Negative for ear discharge, ear pain and sore throat.   Eyes: Negative for blurred vision.  Respiratory: Negative for cough, sputum production,  shortness of breath and wheezing.   Cardiovascular: Negative for chest pain, palpitations, orthopnea, leg swelling and PND.  Gastrointestinal: Negative for abdominal pain, blood in stool, constipation, diarrhea, heartburn, melena and nausea.  Genitourinary: Negative for dysuria, frequency, hematuria, impotence and urgency.  Musculoskeletal: Negative for back pain, joint pain, myalgias and neck pain.  Skin: Negative for rash.  Neurological: Negative for dizziness, tingling, sensory change, focal weakness and headaches.  Endo/Heme/Allergies: Negative for environmental allergies and polydipsia. Does not bruise/bleed easily.  Psychiatric/Behavioral: Negative for confusion, decreased concentration, depression and suicidal ideas. The patient is nervous/anxious. The patient does not have insomnia.      Objective  Vitals:   06/18/16 1540  BP: 110/70  Pulse: 70  Weight: 212 lb (96.2 kg)  Height:  (1.803 m)    Physical Exam  Constitutional: She is well-developed, well-nourished, and in no distress. No distress.  HENT:  Head: Normocephalic and atraumatic.  Right Ear: External ear normal.  Left Ear: External ear normal.  Nose: Nose normal.  Mouth/Throat: Oropharynx is clear and moist.  Eyes: Conjunctivae and EOM are normal. Pupils are equal, round, and  reactive to light. Right eye exhibits no discharge. Left eye exhibits no discharge.  Neck: Normal range of motion. Neck supple. No JVD present. No thyromegaly present.  Cardiovascular: Normal rate, regular rhythm, normal heart sounds and intact distal pulses.  Exam reveals no gallop and no friction rub.   No murmur heard. Pulmonary/Chest: Effort normal and breath sounds normal. She has no wheezes. She has no rales.  Abdominal: Soft. Bowel sounds are normal. She exhibits no mass. There is no tenderness. There is no guarding.  Musculoskeletal: Normal range of motion. She exhibits no edema.  Lymphadenopathy:    She has no cervical  adenopathy.  Neurological: She is alert. She has normal reflexes.  Skin: Skin is warm and dry. She is not diaphoretic.  Psychiatric: Mood and affect normal.  Nursing note and vitals reviewed.     Assessment & Plan  Problem List Items Addressed This Visit      Cardiovascular and Mediastinum   Essential (primary) hypertension   Relevant Medications   ALPRAZolam (XANAX) 0.5 MG tablet   hydrochlorothiazide (HYDRODIURIL) 12.5 MG tablet   metoprolol tartrate (LOPRESSOR) 25 MG tablet     Digestive   Gastroesophageal reflux disease - Primary   Relevant Medications   omeprazole (PRILOSEC) 40 MG capsule     Nervous and Auditory   Anxiety disorder due to known physiological condition      Meds ordered this encounter  Medications  . omeprazole (PRILOSEC) 40 MG capsule    Sig: Take 1 capsule (40 mg total) by mouth 2 (two) times daily.    Dispense:  90 capsule    Refill:  1  . ALPRAZolam (XANAX) 0.5 MG tablet    Sig: TAKE ONE TABLET BY MOUTH ONCE DAILY AS NEEDED    Dispense:  30 tablet    Refill:  1  . hydrochlorothiazide (HYDRODIURIL) 12.5 MG tablet    Sig: Take 1 tablet (12.5 mg total) by mouth daily.    Dispense:  90 tablet    Refill:  1  . metoprolol tartrate (LOPRESSOR) 25 MG tablet    Sig: Take 0.5 tablets (12.5 mg total) by mouth 2 (two) times daily.    Dispense:  180 tablet    Refill:  1      Dr. Deanna Jones [facility]  06/18/16

## 2016-06-19 ENCOUNTER — Emergency Department: Payer: BC Managed Care – PPO

## 2016-06-19 ENCOUNTER — Ambulatory Visit (INDEPENDENT_AMBULATORY_CARE_PROVIDER_SITE_OTHER)
Admission: EM | Admit: 2016-06-19 | Discharge: 2016-06-19 | Disposition: A | Discharge status: Other Acute Inpatient Hospital | Payer: BC Managed Care – PPO | Source: Home / Self Care | Attending: Family Medicine | Admitting: Family Medicine

## 2016-06-19 ENCOUNTER — Encounter: Payer: Self-pay | Admitting: Intensive Care

## 2016-06-19 ENCOUNTER — Emergency Department
Admission: EM | Admit: 2016-06-19 | Discharge: 2016-06-19 | Disposition: A | Discharge status: Home / Self Care | Payer: BC Managed Care – PPO | Attending: Emergency Medicine | Admitting: Emergency Medicine

## 2016-06-19 DIAGNOSIS — K219 Gastro-esophageal reflux disease without esophagitis: Secondary | ICD-10-CM

## 2016-06-19 DIAGNOSIS — I10 Essential (primary) hypertension: Secondary | ICD-10-CM

## 2016-06-19 DIAGNOSIS — M5137 Other intervertebral disc degeneration, lumbosacral region: Secondary | ICD-10-CM

## 2016-06-19 DIAGNOSIS — F064 Anxiety disorder due to known physiological condition: Secondary | ICD-10-CM | POA: Diagnosis present

## 2016-06-19 DIAGNOSIS — R0602 Shortness of breath: Secondary | ICD-10-CM

## 2016-06-19 DIAGNOSIS — R109 Unspecified abdominal pain: Secondary | ICD-10-CM

## 2016-06-19 DIAGNOSIS — R079 Chest pain, unspecified: Secondary | ICD-10-CM | POA: Diagnosis not present

## 2016-06-19 DIAGNOSIS — R9431 Abnormal electrocardiogram [ECG] [EKG]: Secondary | ICD-10-CM

## 2016-06-19 DIAGNOSIS — R1011 Right upper quadrant pain: Secondary | ICD-10-CM | POA: Insufficient documentation

## 2016-06-19 DIAGNOSIS — K589 Irritable bowel syndrome without diarrhea: Secondary | ICD-10-CM | POA: Insufficient documentation

## 2016-06-19 DIAGNOSIS — R0789 Other chest pain: Secondary | ICD-10-CM | POA: Diagnosis present

## 2016-06-19 DIAGNOSIS — I341 Nonrheumatic mitral (valve) prolapse: Secondary | ICD-10-CM

## 2016-06-19 DIAGNOSIS — E782 Mixed hyperlipidemia: Secondary | ICD-10-CM

## 2016-06-19 DIAGNOSIS — Z79899 Other long term (current) drug therapy: Secondary | ICD-10-CM | POA: Insufficient documentation

## 2016-06-19 LAB — HEPATIC FUNCTION PANEL
ALBUMIN: 4 g/dL (ref 3.5–5.0)
ALT: 20 U/L (ref 14–54)
AST: 28 U/L (ref 15–41)
Alkaline Phosphatase: 73 U/L (ref 38–126)
Bilirubin, Direct: 0.1 mg/dL (ref 0.1–0.5)
Indirect Bilirubin: 1.1 mg/dL — ABNORMAL HIGH (ref 0.3–0.9)
TOTAL PROTEIN: 7 g/dL (ref 6.5–8.1)
Total Bilirubin: 1.2 mg/dL (ref 0.3–1.2)

## 2016-06-19 LAB — BASIC METABOLIC PANEL
Anion gap: 9 (ref 5–15)
BUN: 14 mg/dL (ref 6–20)
CALCIUM: 9.1 mg/dL (ref 8.9–10.3)
CO2: 25 mmol/L (ref 22–32)
CREATININE: 0.73 mg/dL (ref 0.44–1.00)
Chloride: 104 mmol/L (ref 101–111)
GFR calc Af Amer: >60 mL/min (ref >60)
GLUCOSE: 105 mg/dL — AB (ref 65–99)
Potassium: 3.5 mmol/L (ref 3.5–5.1)
SODIUM: 138 mmol/L (ref 135–145)

## 2016-06-19 LAB — URINALYSIS, ROUTINE W REFLEX MICROSCOPIC
Bilirubin Urine: NEGATIVE
Glucose, UA: NEGATIVE mg/dL
Hgb urine dipstick: NEGATIVE
KETONES UR: NEGATIVE mg/dL
LEUKOCYTES UA: NEGATIVE
NITRITE: NEGATIVE
PH: 7 (ref 5.0–8.0)
PROTEIN: NEGATIVE mg/dL
Specific Gravity, Urine: 1.01 (ref 1.005–1.030)

## 2016-06-19 LAB — CBC
HEMATOCRIT: 40.1 % (ref 35.0–47.0)
Hemoglobin: 13.7 g/dL (ref 12.0–16.0)
MCH: 29.4 pg (ref 26.0–34.0)
MCHC: 34.2 g/dL (ref 32.0–36.0)
MCV: 85.8 fL (ref 80.0–100.0)
PLATELETS: 208 10*3/uL (ref 150–440)
RBC: 4.68 MIL/uL (ref 3.80–5.20)
RDW: 13.4 % (ref 11.5–14.5)
WBC: 7.8 10*3/uL (ref 3.6–11.0)

## 2016-06-19 LAB — TROPONIN I

## 2016-06-19 LAB — LIPASE, BLOOD: LIPASE: 23 U/L (ref 11–51)

## 2016-06-19 LAB — FIBRIN DERIVATIVES D-DIMER (ARMC ONLY): Fibrin derivatives D-dimer (ARMC): 236.23 (ref 0.00–499.00)

## 2016-06-19 MED ORDER — NITROGLYCERIN 0.4 MG SL SUBL
0.4000 mg | SUBLINGUAL_TABLET | SUBLINGUAL | Status: DC | PRN
  Administered 2016-06-19: 0.4 mg via SUBLINGUAL

## 2016-06-19 MED ORDER — KETOROLAC TROMETHAMINE 10 MG PO TABS: 10.0000 mg | ORAL_TABLET | Freq: Four times a day (QID) | ORAL | 0 refills | Status: DC | PRN

## 2016-06-19 MED ORDER — NITROGLYCERIN 2 % TD OINT
TOPICAL_OINTMENT | TRANSDERMAL | Status: AC
  Administered 2016-06-19: 0.5 [in_us] via TOPICAL
  Filled 2016-06-19: qty 1

## 2016-06-19 MED ORDER — ONDANSETRON HCL 4 MG/2ML IJ SOLN
4.0000 mg | Freq: Once | INTRAMUSCULAR | Status: AC
  Administered 2016-06-19: 4 mg via INTRAVENOUS

## 2016-06-19 MED ORDER — ASPIRIN 81 MG PO CHEW
324.0000 mg | CHEWABLE_TABLET | Freq: Once | ORAL | Status: AC
  Administered 2016-06-19: 324 mg via ORAL

## 2016-06-19 MED ORDER — GI COCKTAIL ~~LOC~~
30.0000 mL | Freq: Once | ORAL | Status: AC
  Administered 2016-06-19: 30 mL via ORAL
  Filled 2016-06-19: qty 30

## 2016-06-19 MED ORDER — ALPRAZOLAM 0.5 MG PO TABS
0.2500 mg | ORAL_TABLET | ORAL | Status: AC
  Administered 2016-06-19: 0.25 mg via ORAL
  Filled 2016-06-19: qty 1

## 2016-06-19 MED ORDER — NITROGLYCERIN 2 % TD OINT
0.5000 [in_us] | TOPICAL_OINTMENT | TRANSDERMAL | Status: AC
  Administered 2016-06-19: 0.5 [in_us] via TOPICAL

## 2016-06-19 MED ORDER — KETOROLAC TROMETHAMINE 30 MG/ML IJ SOLN
15.0000 mg | Freq: Once | INTRAMUSCULAR | Status: AC
  Administered 2016-06-19: 15 mg via INTRAVENOUS

## 2016-06-19 MED ORDER — IOPAMIDOL (ISOVUE-370) INJECTION 76%
75.0000 mL | Freq: Once | INTRAVENOUS | Status: AC | PRN
  Administered 2016-06-19: 75 mL via INTRAVENOUS

## 2016-06-19 MED ORDER — KETOROLAC TROMETHAMINE 30 MG/ML IJ SOLN
INTRAMUSCULAR | Status: AC
  Administered 2016-06-19: 15 mg via INTRAVENOUS
  Filled 2016-06-19: qty 1

## 2016-06-19 MED ORDER — ONDANSETRON HCL 4 MG/2ML IJ SOLN
INTRAMUSCULAR | Status: AC
  Administered 2016-06-19: 4 mg via INTRAVENOUS
  Filled 2016-06-19: qty 2

## 2016-06-19 NOTE — Discharge Instructions (Signed)

## 2016-06-19 NOTE — ED Notes (Signed)
Report received from amber. Awaiting lab results, here for mid epigastric/chest pain.

## 2016-06-19 NOTE — ED Triage Notes (Signed)
Patient arrived from [facility] urgent care by EMS for chest pain (pressure) that started 0600 that occurred in epi gastric area that radiates to back between shoulder blades. vitals157/70 b/p, 1 nitro spray and 324 aspirin given at [facility] urgent care. Ambulatory in room with no problems. A&O x4.

## 2016-06-19 NOTE — ED Triage Notes (Signed)
Patient complains of middle chest pain and radiates through to her back. Patient states that this occurred back in august and she had a "normal" heart cath. Patient reports that pain started around 615am. Patient states that pain has not resolved. Patient has taken  of ibuprofen and .  of alprazolam.

## 2016-06-19 NOTE — ED Provider Notes (Signed)
[facility]    CSN: [id number] Arrival date & time: 06/19/16  0912  History   Chief Complaint Chief Complaint  Patient presents with  . Chest Pain   HPI  51 year old female with a history of tachycardia, anxiety, GERD/gastritis, hypertension, hyperlipidemia, history of chest pain with normal coronary arteries per cath on 09/12/15 presents with complaints of chest pain.  Patient reports that she woke up this morning with chest pain located in the middle of her sternum. Constant. It radiates to the back. Severe. Currently not a tendon pain. Some associated mild shortness of breath. She reports that she's had hand numbness and tingling as well. She took some ibuprofen and Xanax without improvement this morning. He did not occur with exertion. No associated diaphoresis. No reports of nausea. No other associated symptoms. No other complaints or concerns at this time.  Past Medical History:  Diagnosis Date  . Anxiety   . AV node dysfunction    tachycardia sees Dr. Lady Gary  . Cervical disc disease    degenerative disk   . Chronic abdominal pain   . Heart murmur   . Hyperlipidemia   . Hypertension   . IBS (irritable bowel syndrome)   . Mitral valve prolapse   . Palpitations   . Sleep apnea    possible but not tested   Patient Active Problem List   Diagnosis Date Noted  . Menopause 01/28/2016  . Essential hypertension 05/15/2015  . Hyperlipidemia 05/15/2015  . Gastritis   . 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 Heart murmur 07/16/2014  . Gastroesophageal reflux disease 07/16/2014  . H/O: osteoarthritis 07/16/2014   Past Surgical History:  Procedure Laterality Date  . APPENDECTOMY  2008  . CARDIAC CATHETERIZATION N/A 09/12/2015   Procedure: Left Heart Cath and Coronary Angiography;  Surgeon: Marcina Millard, MD;   Location: [facility] INV[facility];  Service: Cardiovascular;  Laterality: N/A;  . ESOPHAGOGASTRODUODENOSCOPY (EGD) WITH PROPOFOL N/A 04/18/2015   Procedure: ESOPHAGOGASTRODUODENOSCOPY (EGD) WITH PROPOFOL;  Surgeon: Midge Minium, MD;  Location: [facility];  Service: Endoscopy;  Laterality: N/A;  . HERNIA REPAIR     OB History    No data available     Home Medications    Prior to Admission medications   Medication Sig Start Date End Date Taking? Authorizing Provider  ALPRAZolam Prudy Feeler) 0.5 MG tablet TAKE ONE TABLET BY MOUTH ONCE DAILY AS NEEDED 06/18/16  Yes Duanne Limerick, MD  Cholecalciferol (VITAMIN D-3) 1000 units CAPS Take 1 capsule by mouth daily.   Yes Historical Provider, MD  Cyanocobalamin (B-12) 1000 MCG CAPS Take by mouth.   Yes Historical Provider, MD  ferrous sulfate 325 (65 FE) MG tablet Take 325 mg by mouth daily with breakfast.   Yes Historical Provider, MD  hydrochlorothiazide (HYDRODIURIL) 12.5 MG tablet Take 1 tablet (12.5 mg total) by mouth daily. 06/18/16  Yes Duanne Limerick, MD  Magnesium-Potassium 250-100 MG TABS Take 1 tablet by mouth daily.   Yes Historical Provider, MD  metoprolol tartrate (LOPRESSOR) 25 MG tablet Take 0.5 tablets (12.5 mg total) by mouth 2 (two) times daily. 06/18/16  Yes Duanne Limerick, MD  omeprazole (PRILOSEC) 40 MG capsule Take 1 capsule (40 mg total) by mouth 2 (two) times daily. 06/18/16  Yes Duanne Limerick, MD  simvastatin (ZOCOR) 40 MG tablet Take 1 tablet (40 mg total) by mouth daily. 01/28/16  Yes Duanne Limerick, MD  ondansetron (ZOFRAN ODT) 8 MG disintegrating tablet Take 1 tablet (8 mg total) by mouth every 8 (eight) hours as needed. Patient not taking: Reported on 06/18/2016 06/02/16   Payton Mccallum, MD    Family History Family History  Problem Relation Age of Onset  . Atrial fibrillation Mother   . Hypertension Mother   . Diabetes Mother   . Atrial fibrillation Father     Social History Social History  Substance Use Topics  .  Smoking status: Never Smoker  . Smokeless tobacco: Never Used  . Alcohol use No    Allergies   Oxycodone-acetaminophen; Oxycodone-acetaminophen; Ciprofloxacin; Fenofibrate; Sertraline; and Vicodin [hydrocodone-acetaminophen]  Review of Systems Review of Systems  Respiratory: Positive for shortness of breath.   Cardiovascular: Positive for chest pain.  Musculoskeletal: Positive for back pain.   Physical Exam Triage Vital Signs ED Triage Vitals  Enc Vitals Group     BP 06/19/16 0921 (!) 157/72     Pulse Rate 06/19/16 0921 82     Resp 06/19/16 0921 17     Temp 06/19/16 0921 98.1 F (36.7 C)     Temp Source 06/19/16 0921 Oral     SpO2 06/19/16 0921 100 %     Weight 06/19/16 0919 212 lb (96.2 kg)     Height 06/19/16 0919  (1.803 m)     Head Circumference --      Peak Flow --      Pain Score 06/19/16 0919 9     Pain Loc --      Pain Edu? --      Excl. in GC? --    Updated Vital Signs BP (!) 157/72 (BP Location: Left Arm)   Pulse 82   Temp 98.1 F (36.7 C) (Oral)   Resp 17   Ht  (1.803 m)   Wt 212 lb (96.2 kg)   SpO2 100%   BMI 29.57 kg/m   Physical Exam  Constitutional: She is oriented to person, place, and time. She appears well-developed.  Appears anxious.  HENT:  Head: Normocephalic and atraumatic.  Eyes: Conjunctivae are normal.  Neck: Normal range of motion.  Cardiovascular: Normal rate and regular rhythm.   Pulmonary/Chest: Effort normal and breath sounds normal. She exhibits no tenderness.  Abdominal: Soft. She exhibits no distension.  Musculoskeletal: Normal range of motion.  Neurological: She is alert and oriented to person, place, and time.  Psychiatric:  Anxious and tearful.  Vitals reviewed.  UC Treatments / Results  Labs (all labs ordered are listed, but only abnormal results are displayed) Labs Reviewed - No data to display  ED ECG REPORT   Date: 06/19/2016  EKG Time: 9:47 AM  Rate: 79  Rhythm: Normal sinus rhythm with a rate  of 79.  Axis: Normal axis.  Intervals: Normal intervals.  ST&T Change: No apparent ST or T-wave changes.  Narrative Interpretation: Normal sinus rhythm with a rate of 79. Normal intervals. No ST or T-wave changes suggestive of ischemia. Normal EKG.  Radiology No results found.  Procedures Procedures (including critical care time)  Medications Ordered in UC Medications  nitroGLYCERIN (NITROSTAT) SL tablet 0.4 mg (0.4 mg Sublingual Given 06/19/16 0936)  aspirin chewable tablet 324 mg (324 mg Oral Given 06/19/16 0934)   Initial Impression / Assessment and Plan / UC Course  I have reviewed the triage vital signs and the nursing notes.  Pertinent labs & imaging results that were available during my care of the  patient were reviewed by me and considered in my medical decision making (see chart for details).   51 year old female presents with acute onset of chest pain. Patient was given aspirin 325 mg and sublingual nitroglycerin. My suspicion for cardiac disease is very low given the fact she's had normal coronary arteries per catheterization less than a year ago. However, her pain is currently 9 out of 10 and given this as well as her past medical history I think it warrants cycling of cardiac enzymes and observation emergency department. Sending via EMS.  Final Clinical Impressions(s) / UC Diagnoses   Final diagnoses:  Chest pain, unspecified type   New Prescriptions New Prescriptions   No medications on file     Tommie Sams, DO 06/19/16 [id number]

## 2016-06-19 NOTE — Consult Note (Signed)
History and Physical    Sherri Matthews ZOX:[id number] DOB: 1965-07-01 DOA: 06/19/2016  Referring physician: Dr. Fanny Bien PCP: Elizabeth Sauer, MD  Specialists: Dr. Lady Gary  Chief Complaint: chest pain  HPI: Sherri Matthews is a 50 y.o. female has a past medical history significant for HTN and anxiety with Barrett's esophagus dx'd by EGD several years ago. Presents to ER now with 8 hour hx of SSCP radiating thru to the back with nausea. Pain is not worse with exertion or deep inspiration. No SOB or vomiting. EKG shows some T-wave changes which have been intermittent in the past. Initial troponin normal. Pain has not improved despite nitrates and morphine. Of note, pt had cardiac catheterization done 9 months ago for similar sx's which showed normal coronary anatomy. The ER MD is hesitant to D/C pt home and has requested a consult in regards to her CP.  Review of Systems: The patient denies anorexia, fever, weight loss,, vision loss, decreased hearing, hoarseness,  syncope, dyspnea on exertion, peripheral edema, balance deficits, hemoptysis, abdominal pain, melena, hematochezia, severe indigestion/heartburn, hematuria, incontinence, genital sores, muscle weakness, suspicious skin lesions, transient blindness, difficulty walking, depression, unusual weight change, abnormal bleeding, enlarged lymph nodes, angioedema, and breast masses.   Past Medical History:  Diagnosis Date  . Anxiety   . AV node dysfunction    tachycardia sees Dr. Lady Gary  . Cervical disc disease    degenerative disk   . Chronic abdominal pain   . Heart murmur   . Hyperlipidemia   . Hypertension   . IBS (irritable bowel syndrome)   . Mitral valve prolapse   . Palpitations   . Sleep apnea    possible but not tested   Past Surgical History:  Procedure Laterality Date  . APPENDECTOMY  2008  . CARDIAC CATHETERIZATION N/A 09/12/2015   Procedure: Left Heart Cath and Coronary Angiography;  Surgeon: Marcina Millard, MD;   Location: [facility] INVASIVE CV LAB;  Service: Cardiovascular;  Laterality: N/A;  . ESOPHAGOGASTRODUODENOSCOPY (EGD) WITH PROPOFOL N/A 04/18/2015   Procedure: ESOPHAGOGASTRODUODENOSCOPY (EGD) WITH PROPOFOL;  Surgeon: Midge Minium, MD;  Location: [facility];  Service: Endoscopy;  Laterality: N/A;  . HERNIA REPAIR     Social History:  reports that she has never smoked. She has never used smokeless tobacco. She reports that she does not drink alcohol or use drugs.  Allergies  Allergen Reactions  . Oxycodone-Acetaminophen Itching  . Oxycodone-Acetaminophen Itching  . Ciprofloxacin Nausea And Vomiting  . Dilaudid [Hydromorphone Hcl]   . Fenofibrate     myalgia  . Sertraline Other (See Comments)    Pt prefers to never be on this again.    . Vicodin [Hydrocodone-Acetaminophen] Other (See Comments)    Heart raced.    Family History  Problem Relation Age of Onset  . Atrial fibrillation Mother   . Hypertension Mother   . Diabetes Mother   . Atrial fibrillation Father     Prior to Admission medications   Medication Sig Start Date End Date Taking? Authorizing Provider  acetaminophen (TYLENOL) 500 MG tablet Take 500 mg by mouth every 6 (six) hours as needed.   Yes Historical Provider, MD  ALPRAZolam Prudy Feeler) 0.5 MG tablet TAKE ONE TABLET BY MOUTH ONCE DAILY AS NEEDED 06/18/16  Yes Duanne Limerick, MD  Cholecalciferol (VITAMIN D-3) 1000 units CAPS Take 1 capsule by mouth daily.   Yes Historical Provider, MD  Cyanocobalamin (B-12) 1000 MCG CAPS Take by mouth.   Yes Historical Provider, MD  ferrous  sulfate 325 (65 FE) MG tablet Take 325 mg by mouth daily with breakfast.   Yes Historical Provider, MD  hydrochlorothiazide (HYDRODIURIL) 12.5 MG tablet Take 1 tablet (12.5 mg total) by mouth daily. Patient taking differently: Take 6.25 mg by mouth daily.  06/18/16  Yes Duanne Limerick, MD  ibuprofen (ADVIL,MOTRIN) 200 MG tablet Take 200 mg by mouth every 6 (six) hours as needed.   Yes Historical  Provider, MD  Magnesium-Potassium 250-100 MG TABS Take 1 tablet by mouth daily.   Yes Historical Provider, MD  metoprolol tartrate (LOPRESSOR) 25 MG tablet Take 0.5 tablets (12.5 mg total) by mouth 2 (two) times daily. 06/18/16  Yes Duanne Limerick, MD  omeprazole (PRILOSEC) 40 MG capsule Take 1 capsule (40 mg total) by mouth 2 (two) times daily. Patient taking differently: Take 40 mg by mouth daily.  06/18/16  Yes Duanne Limerick, MD  simvastatin (ZOCOR) 40 MG tablet Take 1 tablet (40 mg total) by mouth daily. 01/28/16  Yes Duanne Limerick, MD  ondansetron (ZOFRAN ODT) 8 MG disintegrating tablet Take 1 tablet (8 mg total) by mouth every 8 (eight) hours as needed. Patient not taking: Reported on 06/18/2016 06/02/16   Payton Mccallum, MD   Physical Exam: Vitals:   06/19/16 1100 06/19/16 1130 06/19/16 1200 06/19/16 1230  BP: 134/84 140/78 131/82 (!) 159/84  Pulse: 64 61 65 65  Resp: Temp:      TempSrc:      SpO2: 100% 99% 98% 99%  Weight:      Height:         General:  No apparent distress, WDWN, [place]/AT  Eyes: PERRL, EOMI, no scleral icterus, conjunctiva clear  ENT: moist oropharynx without exudate, TM's benign, dentition good  Neck: supple, no lymphadenopathy. No bruits or thyromegaly  Cardiovascular: regular rate without MRG; 2+ peripheral pulses, no JVD, no peripheral edema  Respiratory: CTA biL, good air movement without wheezing, rhonchi or crackled. Respiratory effort normal  Abdomen: soft, non tender to palpation, positive bowel sounds, no guarding, no rebound  Skin: no rashes or lesions  Musculoskeletal: normal bulk and tone, no joint swelling  Psychiatric: normal mood and affect, A&OX3  Neurologic: CN 2-12 grossly intact, Motor strength 5/5 in all 4 groups with symmetric DTR's and non-focal sensory exam   Labs on Admission:  Basic Metabolic Panel:  Recent Labs Lab 06/19/16 1033  NA 138  K 3.5  CL 104  CO2 25  GLUCOSE 105*  BUN 14  CREATININE 0.73   CALCIUM 9.1   Liver Function Tests:  Recent Labs Lab 06/19/16 1033  AST 28  ALT 20  ALKPHOS 73  BILITOT 1.2  PROT 7.0  ALBUMIN 4.0    Recent Labs Lab 06/19/16 1033  LIPASE 23   No results for input(s): AMMONIA in the last 168 hours. CBC:  Recent Labs Lab 06/19/16 1033  WBC 7.8  HGB 13.7  HCT 40.1  MCV 85.8  PLT 208   Cardiac Enzymes:  Recent Labs Lab 06/19/16 1033  TROPONINI <0.03    BNP (last 3 results) No results for input(s): BNP in the last 8760 hours.  ProBNP (last 3 results) No results for input(s): PROBNP in the last 8760 hours.  CBG: No results for input(s): GLUCAP in the last 168 hours.  Radiological Exams on Admission: Dg Chest 2 View  Result Date: 06/19/2016 CLINICAL DATA:  Mid chest pain and pressure. EXAM: CHEST  2 VIEW COMPARISON:  09/11/2015 FINDINGS: Cardiomediastinal  silhouette is normal. Mediastinal contours appear intact. There is no evidence of focal airspace consolidation, pleural effusion or pneumothorax. Osseous structures are without acute abnormality. Stable compression deformity of 1 of the mid thoracic vertebral bodies. Soft tissues are grossly normal. IMPRESSION: No active cardiopulmonary disease. Stable mild superior endplate compression deformity of 1 of the mid thoracic vertebral bodies. Electronically Signed   By: Ted Mcalpine M.D.   On: 06/19/2016 11:12   US Abdomen Limited Ruq  Result Date: 06/19/2016 CLINICAL DATA:  51 year old female with a history of right upper quadrant pain and chest pain EXAM: US ABDOMEN LIMITED - RIGHT UPPER QUADRANT COMPARISON:  None. FINDINGS: Gallbladder: No gallstones or wall thickening visualized. No sonographic Murphy sign noted by sonographer. Common bile duct: Diameter: 4 mm Liver: Heterogeneous echogenicity of liver parenchyma. IMPRESSION: Sonographic survey negative for cholelithiasis or evidence of acute cholecystitis. Increased liver echogenicity, potentially related to steatosis or  other medical liver disease. Electronically Signed   By: Gilmer Mor D.O.   On: 06/19/2016 13:15    EKG: Independently reviewed.  Assessment/Plan Principal Problem:   Chest pain Active Problems:   Anxiety disorder due to known physiological condition   Essential hypertension   Abnormal EKG   Would recommend trial of GI cocktail. Repeat troponin as pt has now had continuous pain for over 8 hours. CT chest to r/o PE. Pt is very low risk for cardiac etiology of pain given normal cardiac cath 9 months ago. If CT negative and troponin remains normal, OK to discharge home to f/u with Dr. Lady Gary and PCP  Diet: per ER Fluids: per ER DVT Prophylaxis: per ER  Code Status: FULL  Family Communication: yes  Disposition Plan: home  Time spent: 50 min

## 2016-06-19 NOTE — ED Notes (Signed)
Patient transported to X-ray 

## 2016-06-19 NOTE — ED Provider Notes (Signed)
[facility] Emergency Department Provider Note   ____________________________________________   First MD Initiated Contact with Patient 06/19/16 1159     (approximate)  I have reviewed the triage vital signs and the nursing notes.   HISTORY  Chief Complaint Chest Pain    HPI Sherri Matthews is a 51 y.o. female previous history of anxiety, radicular disc disease, diverticulitis  The patient reports that about 2 weeks ago she was seen and treated with Augmentin for diverticulitis, and this has improved her symptoms. However, this morning at about 6:15 AM she woke up with pressure across her chest which she describes as severe. Described as a heaviness that does not radiate but associate the mild feeling of shortness of breath  She reports she had the same about one year ago and was admitted to the hospital, she was treated with a cardiac catheterization and follows with [facility] cardiology. She was told that her cardiac catheterization was normal  Patient does carry a diagnosis of hypertension, elevated cholesterol. Denies early family history of heart disease.  She continues to endorse a feeling of moderate pressure across her chest. This is not worsened by exertion. Nothing seems to relieve it. She took ibuprofen with no help, was given aspirin and nitroglycerin at urgent care before being sent here and denies improvement in her symptoms thereafter.  She also reports that she is not experience any abdominal pain, but has been told that gallstones can cause similar symptoms and is wondering if he could be a cause.   Past Medical History:  Diagnosis Date  . Anxiety   . AV node dysfunction    tachycardia sees Dr. Lady Gary  . Cervical disc disease    degenerative disk   . Chronic abdominal pain   . Heart murmur   . Hyperlipidemia   . Hypertension   . IBS (irritable bowel syndrome)   . Mitral valve prolapse   . Palpitations   . Sleep apnea    possible but not tested    Patient Active Problem List   Diagnosis Date Noted  . Chest pain 06/19/2016  . Abnormal EKG 06/19/2016  . Menopause 01/28/2016  . Essential hypertension 05/15/2015  . Hyperlipidemia 05/15/2015  . Gastritis   . 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 Heart murmur 07/16/2014  . Gastroesophageal reflux disease 07/16/2014  . H/O: osteoarthritis 07/16/2014    Past Surgical History:  Procedure Laterality Date  . APPENDECTOMY  2008  . CARDIAC CATHETERIZATION N/A 09/12/2015   Procedure: Left Heart Cath and Coronary Angiography;  Surgeon: Marcina Millard, MD;  Location: [facility] INVASIVE CV LAB;  Service: Cardiovascular;  Laterality: N/A;  . ESOPHAGOGASTRODUODENOSCOPY (EGD) WITH PROPOFOL N/A 04/18/2015   Procedure: ESOPHAGOGASTRODUODENOSCOPY (EGD) WITH PROPOFOL;  Surgeon: Midge Minium, MD;  Location: [facility];  Service: Endoscopy;  Laterality: N/A;  . HERNIA REPAIR      Prior to Admission medications   Medication Sig Start Date End Date Taking? Authorizing Provider  acetaminophen (TYLENOL) 500 MG tablet Take 500 mg by mouth every 6 (six) hours as needed.   Yes Historical Provider, MD  ALPRAZolam Prudy Feeler) 0.5 MG tablet TAKE ONE TABLET BY MOUTH ONCE DAILY AS NEEDED 06/18/16  Yes Duanne Limerick, MD  Cholecalciferol (VITAMIN D-3) 1000 units CAPS Take 1 capsule by mouth daily.   Yes Historical Provider, MD  Cyanocobalamin (B-12) 1000 MCG CAPS Take by mouth.  Yes Historical Provider, MD  ferrous sulfate 325 (65 FE) MG tablet Take 325 mg by mouth daily with breakfast.   Yes Historical Provider, MD  hydrochlorothiazide (HYDRODIURIL) 12.5 MG tablet Take 1 tablet (12.5 mg total) by mouth daily. Patient taking differently: Take 6.25 mg by mouth daily.  06/18/16  Yes Duanne Limerick, MD  ibuprofen (ADVIL,MOTRIN) 200 MG  tablet Take 200 mg by mouth every 6 (six) hours as needed.   Yes Historical Provider, MD  Magnesium-Potassium 250-100 MG TABS Take 1 tablet by mouth daily.   Yes Historical Provider, MD  metoprolol tartrate (LOPRESSOR) 25 MG tablet Take 0.5 tablets (12.5 mg total) by mouth 2 (two) times daily. 06/18/16  Yes Duanne Limerick, MD  omeprazole (PRILOSEC) 40 MG capsule Take 1 capsule (40 mg total) by mouth 2 (two) times daily. Patient taking differently: Take 40 mg by mouth daily.  06/18/16  Yes Duanne Limerick, MD  simvastatin (ZOCOR) 40 MG tablet Take 1 tablet (40 mg total) by mouth daily. 01/28/16  Yes Duanne Limerick, MD  ondansetron (ZOFRAN ODT) 8 MG disintegrating tablet Take 1 tablet (8 mg total) by mouth every 8 (eight) hours as needed. Patient not taking: Reported on 06/18/2016 06/02/16   Payton Mccallum, MD    Allergies Oxycodone-acetaminophen; Oxycodone-acetaminophen; Ciprofloxacin; Dilaudid [hydromorphone hcl]; Fenofibrate; Sertraline; and Vicodin [hydrocodone-acetaminophen]  Family History  Problem Relation Age of Onset  . Atrial fibrillation Mother   . Hypertension Mother   . Diabetes Mother   . Atrial fibrillation Father     Social History Social History  Substance Use Topics  . Smoking status: Never Smoker  . Smokeless tobacco: Never Used  . Alcohol use No    Review of Systems Constitutional: No fever/chills Eyes: No visual changes. ENT: No sore throat. Cardiovascular: Denies chest pain. Reports heavy chest pressure that does not radiate. Respiratory: Denies shortness of breath. Gastrointestinal: No abdominal pain.  No nausea, no vomiting.  No diarrhea.  No constipation. Genitourinary: Negative for dysuria. Musculoskeletal: Negative for back pain. Skin: Negative for rash. Neurological: Negative for headaches, focal weakness or numbness.  10-point ROS otherwise negative.  ____________________________________________   PHYSICAL EXAM:  VITAL SIGNS: ED Triage Vitals  Enc  Vitals Group     BP 06/19/16 1031 (!) 153/77     Pulse Rate 06/19/16 1031 64     Resp 06/19/16 1031 (!) 21     Temp 06/19/16 1031 98.3 F (36.8 C)     Temp Source 06/19/16 1031 Oral     SpO2 06/19/16 1031 100 %     Weight 06/19/16 1035 212 lb (96.2 kg)     Height 06/19/16 1035  (1.803 m)     Head Circumference --      Peak Flow --      Pain Score 06/19/16 1031 8     Pain Loc --      Pain Edu? --      Excl. in GC? --     Constitutional: Alert and oriented. Well appearing and in no acute distress. Eyes: Conjunctivae are normal. PERRL. EOMI. Head: Atraumatic. Nose: No congestion/rhinnorhea. Mouth/Throat: Mucous membranes are moist.  Oropharynx non-erythematous. Neck: No stridor.   Cardiovascular: Normal rate, regular rhythm. Grossly normal heart sounds.  Good peripheral circulation. Respiratory: Normal respiratory effort.  No retractions. Lungs CTAB. Gastrointestinal: Soft and nontender. No distention. Negative Murphy. No rebound guarding or peritonitis throughout. Musculoskeletal: No lower extremity tenderness nor edema. Neurologic:  Normal speech and language. No gross focal  neurologic deficits are appreciated.  Skin:  Skin is warm, dry and intact. No rash noted. Psychiatric: Mood and affect are normal. Speech and behavior are normal.  ____________________________________________   LABS (all labs ordered are listed, but only abnormal results are displayed)  Labs Reviewed  BASIC METABOLIC PANEL - Abnormal; Notable for the following:       Result Value   Glucose, Bld 105 (*)    All other components within normal limits  URINALYSIS, ROUTINE W REFLEX MICROSCOPIC - Abnormal; Notable for the following:    Color, Urine STRAW (*)    APPearance CLEAR (*)    All other components within normal limits  HEPATIC FUNCTION PANEL - Abnormal; Notable for the following:    Indirect Bilirubin 1.1 (*)    All other components within normal limits  CBC  TROPONIN I  LIPASE, BLOOD    FIBRIN DERIVATIVES D-DIMER ([facility] ONLY)  TROPONIN I   ____________________________________________  EKG  Reviewed and interpreted by me at 10:35 AM Heart rate 60 QRS 98 QTc 4:30 Normal sinus rhythm, there is T-wave inversion noted in lead 3 and V3 as well as slight depression in V4 and V5, suspicious for possible ischemic change  Compared with the patient's previous EKG from 09/12/2015, T-wave inversion in V3 is new, and also in comparison to the EKG performed at the urgent care today at 928 The EKG also appears to have a slight new inversion in V3, but depressions in the lateral leads appear somewhat chronic ____________________________________________  RADIOLOGY  Dg Chest 2 View  Result Date: 06/19/2016 CLINICAL DATA:  Mid chest pain and pressure. EXAM: CHEST  2 VIEW COMPARISON:  09/11/2015 FINDINGS: Cardiomediastinal silhouette is normal. Mediastinal contours appear intact. There is no evidence of focal airspace consolidation, pleural effusion or pneumothorax. Osseous structures are without acute abnormality. Stable compression deformity of 1 of the mid thoracic vertebral bodies. Soft tissues are grossly normal. IMPRESSION: No active cardiopulmonary disease. Stable mild superior endplate compression deformity of 1 of the mid thoracic vertebral bodies. Electronically Signed   By: Ted Mcalpine M.D.   On: 06/19/2016 11:12   Ct Angio Chest Pe W Or Wo Contrast  Result Date: 06/19/2016 CLINICAL DATA:  Chest pain. EXAM: CT ANGIOGRAPHY CHEST WITH CONTRAST TECHNIQUE: Multidetector CT imaging of the chest was performed using the standard protocol during bolus administration of intravenous contrast. Multiplanar CT image reconstructions and MIPs were obtained to evaluate the vascular anatomy. CONTRAST:  75 cc Isovue 370 intravenously. COMPARISON:  None. FINDINGS: Cardiovascular: Satisfactory opacification of the pulmonary arteries to the segmental level. No evidence of pulmonary embolism.  Normal heart size. No pericardial effusion. Mediastinum/Nodes: No enlarged mediastinal, hilar, or axillary lymph nodes. Thyroid gland, trachea, and esophagus demonstrate no significant findings. Lungs/Pleura: Lungs are clear. No pleural effusion or pneumothorax. Upper Abdomen: No acute abnormality.  Small hiatal hernia. Musculoskeletal: No chest wall abnormality. No acute or significant osseous findings. Mild deformity of the superior endplate of T8 vertebral body, likely degenerative. Review of the MIP images confirms the above findings. IMPRESSION: No evidence of pulmonary embolus. Small hiatal hernia. No evidence of pulmonary consolidation. Electronically Signed   By: Ted Mcalpine M.D.   On: 06/19/2016 15:25   US Abdomen Limited Ruq  Result Date: 06/19/2016 CLINICAL DATA:  52 year old female with a history of right upper quadrant pain and chest pain EXAM: US ABDOMEN LIMITED - RIGHT UPPER QUADRANT COMPARISON:  None. FINDINGS: Gallbladder: No gallstones or wall thickening visualized. No sonographic Murphy sign noted by sonographer.  Common bile duct: Diameter: 4 mm Liver: Heterogeneous echogenicity of liver parenchyma. IMPRESSION: Sonographic survey negative for cholelithiasis or evidence of acute cholecystitis. Increased liver echogenicity, potentially related to steatosis or other medical liver disease. Electronically Signed   By: Gilmer Mor D.O.   On: 06/19/2016 13:15    ____________________________________________   PROCEDURES  Procedure(s) performed: None  Procedures  Critical Care performed: No  ____________________________________________   INITIAL IMPRESSION / ASSESSMENT AND PLAN / ED COURSE  Pertinent labs & imaging results that were available during my care of the patient were reviewed by me and considered in my medical decision making (see chart for details).      Patient presents for chest pressure. He's had previous workup which was unrevealing for similar  symptoms. Her symptoms today persist as a pressure, but she denies pain. Is somewhat unclear as the cause, though her EKG today does have a slight new nonspecific T-wave abnormality. Discussed her case with Dr. Cassie Freer of cardiology was previously evaluated her.  In addition her abdominal labs and ultrasound does not demonstrate an acute finding, we did discuss her ultrasound of the liver was slightly abnormal, and she reports that she be happy to follow-up with her primary care doctor regarding this.  D-dimer performed, patient is low risk by well's criteria. D-dimer is normal with a  low pretest probability for PE.  ----------------------------------------- 5:18 PM on 06/19/2016 -----------------------------------------  Patient resting comfortably. Patient agreeable with plan for close follow-up with Dr. Lady Gary and careful return precautions. Brief prescription for Toradol given, as patient reports this assists quite a bit with discomfort. Careful return precautions advised the patient and her family. They'll be following up closely with cardiology. ____________________________________________   FINAL CLINICAL IMPRESSION(S) / ED DIAGNOSES  Final diagnoses:  Abdominal pain  Atypical chest pain      NEW MEDICATIONS STARTED DURING THIS VISIT:  New Prescriptions   No medications on file     Note:  This document was prepared using Dragon voice recognition software and may include unintentional dictation errors.     Sharyn Creamer, MD 06/19/16 1719

## 2016-06-21 DIAGNOSIS — G43119 Migraine with aura, intractable, without status migrainosus: Secondary | ICD-10-CM | POA: Insufficient documentation

## 2016-09-10 ENCOUNTER — Other Ambulatory Visit: Payer: Self-pay | Admitting: Family Medicine

## 2016-09-10 DIAGNOSIS — I10 Essential (primary) hypertension: Secondary | ICD-10-CM

## 2016-11-02 ENCOUNTER — Other Ambulatory Visit: Payer: Self-pay | Admitting: Family Medicine

## 2016-11-02 DIAGNOSIS — F064 Anxiety disorder due to known physiological condition: Secondary | ICD-10-CM

## 2016-11-02 DIAGNOSIS — I10 Essential (primary) hypertension: Secondary | ICD-10-CM

## 2016-11-09 ENCOUNTER — Ambulatory Visit
Admission: EM | Admit: 2016-11-09 | Discharge: 2016-11-09 | Disposition: A | Discharge status: Home / Self Care | Payer: BC Managed Care – PPO | Attending: Family Medicine | Admitting: Family Medicine

## 2016-11-09 DIAGNOSIS — S39012A Strain of muscle, fascia and tendon of lower back, initial encounter: Secondary | ICD-10-CM

## 2016-11-09 LAB — URINALYSIS, COMPLETE (UACMP) WITH MICROSCOPIC
BILIRUBIN URINE: NEGATIVE
GLUCOSE, UA: NEGATIVE mg/dL
HGB URINE DIPSTICK: NEGATIVE
Ketones, ur: NEGATIVE mg/dL
LEUKOCYTES UA: NEGATIVE
NITRITE: NEGATIVE
PH: 5.5 (ref 5.0–8.0)
RBC / HPF: NONE SEEN RBC/hpf (ref 0–5)

## 2016-11-09 MED ORDER — CYCLOBENZAPRINE HCL 5 MG PO TABS: 5.0000 mg | ORAL_TABLET | Freq: Every day | ORAL | 0 refills | Status: DC

## 2016-11-09 MED ORDER — ONDANSETRON 8 MG PO TBDP: 8.0000 mg | ORAL_TABLET | Freq: Three times a day (TID) | ORAL | 0 refills | Status: DC | PRN

## 2016-11-09 NOTE — ED Provider Notes (Signed)
[facility]    CSN: [id number] Arrival date & time: 11/09/16  0803     History   Chief Complaint Chief Complaint  Patient presents with  . Back Pain    mid    HPI Sherri Matthews is a 51 y.o. female.   The history is provided by the patient.  Back Pain  Location:  Lumbar spine Quality:  Aching Radiates to:  Does not radiate Pain severity:  Mild Pain is:  Same all the time Onset quality:  Sudden Duration:  9 days Timing:  Constant Progression:  Unchanged Chronicity:  New Context: not emotional stress, not falling, not jumping from heights, not lifting heavy objects, not MCA, not MVA, not occupational injury, not pedestrian accident, not physical stress, not recent illness, not recent injury and not twisting   Relieved by:  Ibuprofen (mild) Worsened by:  Twisting and movement Associated symptoms: no abdominal pain, no abdominal swelling, no bladder incontinence, no bowel incontinence, no chest pain, no dysuria, no fever, no headaches, no leg pain, no numbness, no paresthesias, no pelvic pain, no perianal numbness, no tingling, no weakness and no weight loss   Associated symptoms comment:  Nausea   Past Medical History:  Diagnosis Date  . Anxiety   . AV node dysfunction    tachycardia sees Dr. Lady Gary  . Cervical disc disease    degenerative disk   . Chronic abdominal pain   . Heart murmur   . Hyperlipidemia   . Hypertension   . IBS (irritable bowel syndrome)   . Mitral valve prolapse   . Palpitations   . Sleep apnea    possible but not tested    Patient Active Problem List   Diagnosis Date Noted  . Chest pain 06/19/2016  . Abnormal EKG 06/19/2016  . Menopause 01/28/2016  . Essential hypertension 05/15/2015  . Hyperlipidemia 05/15/2015  . Gastritis   . 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  Heart murmur 07/16/2014  . Gastroesophageal reflux disease 07/16/2014  . H/O: osteoarthritis 07/16/2014    Past Surgical History:  Procedure Laterality Date  . APPENDECTOMY  2008  . CARDIAC CATHETERIZATION N/A 09/12/2015   Procedure: Left Heart Cath and Coronary Angiography;  Surgeon: Marcina Millard, MD;  Location: [facility];  Service: Cardiovascular;  Laterality: N/A;  . ESOPHAGOGASTRODUODENOSCOPY (EGD) WITH PROPOFOL N/A 04/18/2015   Procedure: ESOPHAGOGASTRODUODENOSCOPY (EGD) WITH PROPOFOL;  Surgeon: Midge Minium, MD;  Location: [facility];  Service: Endoscopy;  Laterality: N/A;  . HERNIA REPAIR      OB History    No data available       Home Medications    Prior to Admission medications   Medication Sig Start Date End Date Taking? Authorizing Provider  acetaminophen (TYLENOL) 500 MG tablet Take 500 mg by mouth every 6 (six) hours as needed.   Yes [provider]  ALPRAZolam Prudy Feeler) 0.5 MG tablet TAKE ONE TABLET BY MOUTH ONCE DAILY AS NEEDED 06/18/16  Yes Duanne Limerick, MD  Cholecalciferol (VITAMIN D-3) 1000 units CAPS Take 1 capsule by mouth daily.   Yes [provider]  Cyanocobalamin (B-12) 1000 MCG CAPS Take by mouth.   Yes [provider]  ferrous sulfate 325 (65 FE) MG tablet Take 325 mg by mouth daily with breakfast.   Yes [provider]  hydrochlorothiazide (HYDRODIURIL) 12.5 MG tablet Take 1 tablet (12.5 mg  total) by mouth daily. Patient taking differently: Take 6.25 mg by mouth daily.  06/18/16  Yes Duanne Limerick, MD  ibuprofen (ADVIL,MOTRIN) 200 MG tablet Take 200 mg by mouth every 6 (six) hours as needed.   Yes [provider]  ketorolac (TORADOL) 10 MG tablet Take 1 tablet (10 mg total) by mouth every 6 (six) hours as needed. 06/19/16  Yes Sharyn Creamer, MD  metoprolol tartrate (LOPRESSOR) 25 MG tablet Take 0.5 tablets (12.5 mg total) by mouth 2 (two) times daily. 06/18/16  Yes Duanne Limerick, MD    metoprolol tartrate (LOPRESSOR) 25 MG tablet TAKE ONE-HALF TABLET BY MOUTH TWICE DAILY 09/10/16  Yes Duanne Limerick, MD  omeprazole (PRILOSEC) 40 MG capsule Take 1 capsule (40 mg total) by mouth 2 (two) times daily. Patient taking differently: Take 40 mg by mouth daily.  06/18/16  Yes Duanne Limerick, MD  simvastatin (ZOCOR) 40 MG tablet TAKE ONE TABLET BY MOUTH ONCE DAILY 09/10/16  Yes Duanne Limerick, MD  cyclobenzaprine (FLEXERIL) 5 MG tablet Take 1 tablet (5 mg total) by mouth at bedtime. 11/09/16   Payton Mccallum, MD  Magnesium-Potassium 250-100 MG TABS Take 1 tablet by mouth daily.    [provider]  ondansetron (ZOFRAN ODT) 8 MG disintegrating tablet Take 1 tablet (8 mg total) by mouth every 8 (eight) hours as needed. 11/09/16   Payton Mccallum, MD    Family History Family History  Problem Relation Age of Onset  . Atrial fibrillation Mother   . Hypertension Mother   . Diabetes Mother   . Atrial fibrillation Father     Social History Social History  Substance Use Topics  . Smoking status: Never Smoker  . Smokeless tobacco: Never Used  . Alcohol use No     Allergies   Oxycodone-acetaminophen; Oxycodone-acetaminophen; Ciprofloxacin; Dilaudid [hydromorphone hcl]; Fenofibrate; Sertraline; and Vicodin [hydrocodone-acetaminophen]   Review of Systems Review of Systems  Constitutional: Negative for fever and weight loss.  Cardiovascular: Negative for chest pain.  Gastrointestinal: Negative for abdominal pain and bowel incontinence.  Genitourinary: Negative for bladder incontinence, dysuria and pelvic pain.  Musculoskeletal: Positive for back pain.  Neurological: Negative for tingling, weakness, numbness, headaches and paresthesias.     Physical Exam Triage Vital Signs ED Triage Vitals  Enc Vitals Group     BP 11/09/16 0816 130/75     Pulse Rate 11/09/16 0816 66     Resp 11/09/16 0816 18     Temp 11/09/16 0816 98.4 F (36.9 C)     Temp Source 11/09/16 0816 Oral      SpO2 11/09/16 0816 98 %     Weight 11/09/16 0813 215 lb (97.5 kg)     Height 11/09/16 0813  (1.803 m)     Head Circumference --      Peak Flow --      Pain Score 11/09/16 0815 6     Pain Loc --      Pain Edu? --      Excl. in GC? --    No data found.   Updated Vital Signs BP 130/75 (BP Location: Left Arm)   Pulse 66   Temp 98.4 F (36.9 C) (Oral)   Resp 18   Ht  (1.803 m)   Wt 215 lb (97.5 kg)   SpO2 98%   BMI 29.99 kg/m   Visual Acuity Right Eye Distance:   Left Eye Distance:   Bilateral Distance:    Right Eye Near:  Left Eye Near:    Bilateral Near:     Physical Exam  Constitutional: She appears well-developed and well-nourished. No distress.  Musculoskeletal:       Lumbar back: She exhibits tenderness (lumbar paraspinous muscles) and spasm. She exhibits normal range of motion, no bony tenderness, no swelling, no edema, no deformity, no laceration, no pain and normal pulse.  Skin: She is not diaphoretic.  Nursing note and vitals reviewed.    UC Treatments / Results  Labs (all labs ordered are listed, but only abnormal results are displayed) Labs Reviewed  URINALYSIS, COMPLETE (UACMP) WITH MICROSCOPIC - Abnormal; Notable for the following:       Result Value   Specific Gravity, Urine >1.030 (*)    Protein, ur TRACE (*)    Squamous Epithelial / LPF 0-5 (*)    Bacteria, UA RARE (*)    All other components within normal limits    EKG  EKG Interpretation None       Radiology No results found.  Procedures Procedures (including critical care time)  Medications Ordered in UC Medications - No data to display   Initial Impression / Assessment and Plan / UC Course  I have reviewed the triage vital signs and the nursing notes.  Pertinent labs & imaging results that were available during my care of the patient were reviewed by me and considered in my medical decision making (see chart for details).       Final Clinical  Impressions(s) / UC Diagnoses   Final diagnoses:  Strain of lumbar region, initial encounter     New Prescriptions Discharge Medication List as of 11/09/2016  9:06 AM    START taking these medications   Details  cyclobenzaprine (FLEXERIL) 5 MG tablet Take 1 tablet (5 mg total) by mouth at bedtime., Starting Tue 11/09/2016, Normal       1. Lab results and diagnosis reviewed with patient 2. rx as per orders above; reviewed possible side effects, interactions, risks and benefits  3. Recommend supportive treatment with heat, stretch 4. Follow-up prn if symptoms worsen or don't improve  Controlled Substance Prescriptions [place] Controlled Substance Registry consulted? Not Applicable   Payton Mccallum, MD 11/09/16 1047

## 2016-11-09 NOTE — ED Triage Notes (Signed)
Patient complains of mid back pain. Patient states that this has been constant x 9 days. Patient states that she has noticed some urinary frequency. Patient states that pain is on both sides of her spine.

## 2016-11-12 ENCOUNTER — Other Ambulatory Visit: Payer: Self-pay | Admitting: Family Medicine

## 2016-11-12 DIAGNOSIS — F064 Anxiety disorder due to known physiological condition: Secondary | ICD-10-CM

## 2016-11-12 DIAGNOSIS — I10 Essential (primary) hypertension: Secondary | ICD-10-CM

## 2016-11-25 ENCOUNTER — Ambulatory Visit (INDEPENDENT_AMBULATORY_CARE_PROVIDER_SITE_OTHER): Payer: BC Managed Care – PPO | Admitting: Family Medicine

## 2016-11-25 ENCOUNTER — Encounter: Payer: Self-pay | Admitting: Family Medicine

## 2016-11-25 VITALS — BP 120/80 | HR 64 | Ht 71.0 in | Wt 214.0 lb

## 2016-11-25 DIAGNOSIS — F064 Anxiety disorder due to known physiological condition: Secondary | ICD-10-CM

## 2016-11-25 DIAGNOSIS — R945 Abnormal results of liver function studies: Secondary | ICD-10-CM | POA: Diagnosis not present

## 2016-11-25 DIAGNOSIS — R5383 Other fatigue: Secondary | ICD-10-CM | POA: Diagnosis not present

## 2016-11-25 DIAGNOSIS — E782 Mixed hyperlipidemia: Secondary | ICD-10-CM

## 2016-11-25 DIAGNOSIS — I10 Essential (primary) hypertension: Secondary | ICD-10-CM

## 2016-11-25 DIAGNOSIS — Z1239 Encounter for other screening for malignant neoplasm of breast: Secondary | ICD-10-CM

## 2016-11-25 DIAGNOSIS — K219 Gastro-esophageal reflux disease without esophagitis: Secondary | ICD-10-CM | POA: Diagnosis not present

## 2016-11-25 DIAGNOSIS — R7989 Other specified abnormal findings of blood chemistry: Secondary | ICD-10-CM

## 2016-11-25 DIAGNOSIS — Z1231 Encounter for screening mammogram for malignant neoplasm of breast: Secondary | ICD-10-CM

## 2016-11-25 MED ORDER — SIMVASTATIN 40 MG PO TABS: 40.0000 mg | ORAL_TABLET | Freq: Every day | ORAL | 1 refills | Status: DC

## 2016-11-25 MED ORDER — OMEPRAZOLE 40 MG PO CPDR: 40.0000 mg | DELAYED_RELEASE_CAPSULE | Freq: Two times a day (BID) | ORAL | 1 refills | Status: DC

## 2016-11-25 MED ORDER — HYDROCHLOROTHIAZIDE 12.5 MG PO TABS: 12.5000 mg | ORAL_TABLET | Freq: Every day | ORAL | 1 refills | Status: DC

## 2016-11-25 MED ORDER — METOPROLOL TARTRATE 25 MG PO TABS: 12.5000 mg | ORAL_TABLET | Freq: Two times a day (BID) | ORAL | 1 refills | Status: DC

## 2016-11-25 MED ORDER — ALPRAZOLAM 0.5 MG PO TABS: ORAL_TABLET | ORAL | 2 refills | Status: DC

## 2016-11-25 NOTE — Progress Notes (Signed)
Name: Sherri Matthews   MRN: [id number]    DOB: Jun 08, 1965   Date:11/25/2016       Progress Note  Subjective  Chief Complaint  Chief Complaint  Patient presents with  . Hypertension  . Hyperlipidemia  . Gastroesophageal Reflux  . Anxiety    Hypertension  This is a chronic problem. The current episode started more than 1 year ago. The problem has been gradually worsening since onset. The problem is controlled. Associated symptoms include anxiety. Pertinent negatives include no blurred vision, chest pain, headaches, malaise/fatigue, neck pain, orthopnea, palpitations, peripheral edema, PND, shortness of breath or sweats. There are no associated agents to hypertension. There are no known risk factors for coronary artery disease. Past treatments include diuretics and beta blockers. The current treatment provides mild improvement. There are no compliance problems.  There is no history of angina, kidney disease, CAD/MI, CVA, heart failure, left ventricular hypertrophy, PVD or retinopathy. Identifiable causes of hypertension include a thyroid problem. There is no history of chronic renal disease, a hypertension causing med or renovascular disease.  Hyperlipidemia  This is a chronic problem. The current episode started more than 1 year ago. The problem is controlled. Recent lipid tests were reviewed and are normal. She has no history of chronic renal disease. Factors aggravating her hyperlipidemia include thiazides. Pertinent negatives include no chest pain, focal sensory loss, focal weakness, leg pain, myalgias or shortness of breath. Current antihyperlipidemic treatment includes statins. The current treatment provides mild improvement of lipids. There are no compliance problems.  Risk factors for coronary artery disease include obesity, dyslipidemia, hypertension and stress.  Gastroesophageal Reflux  She reports no abdominal pain, no belching, no chest pain, no choking, no coughing, no dysphagia, no  early satiety, no globus sensation, no heartburn, no nausea, no sore throat, no stridor or no wheezing. stress. This is a new problem. The symptoms are aggravated by certain foods. Pertinent negatives include no anemia, fatigue, melena, muscle weakness, orthopnea or weight loss. She has tried a PPI for the symptoms. The treatment provided moderate relief.  Anxiety  Presents for follow-up visit. Symptoms include excessive worry and nervous/anxious behavior. Patient reports no chest pain, depressed mood, dizziness, insomnia, irritability, malaise, nausea, palpitations, panic, shortness of breath or suicidal ideas. Primary symptoms comment: stress. The severity of symptoms is moderate. The quality of sleep is good.    Thyroid Problem  Presents for follow-up visit. Symptoms include anxiety, cold intolerance, constipation and hair loss. Patient reports no depressed mood, diaphoresis, diarrhea, dry skin, fatigue, leg swelling, palpitations, visual change, weight gain or weight loss. The symptoms have been stable. Her past medical history is significant for hyperlipidemia. There is no history of heart failure.    No problem-specific Assessment & Plan notes found for this encounter.   Past Medical History:  Diagnosis Date  . Anxiety   . AV node dysfunction    tachycardia sees Dr. Lady Gary  . Cervical disc disease    degenerative disk   . Chronic abdominal pain   . Heart murmur   . Hyperlipidemia   . Hypertension   . IBS (irritable bowel syndrome)   . Mitral valve prolapse   . Palpitations   . Sleep apnea    possible but not tested    Past Surgical History:  Procedure Laterality Date  . APPENDECTOMY  2008  . CARDIAC CATHETERIZATION N/A 09/12/2015   Procedure: Left Heart Cath and Coronary Angiography;  Surgeon: Marcina Millard, MD;  Location: [facility];  Service:  Cardiovascular;  Laterality: N/A;  . ESOPHAGOGASTRODUODENOSCOPY (EGD) WITH PROPOFOL N/A 04/18/2015   Procedure:  ESOPHAGOGASTRODUODENOSCOPY (EGD) WITH PROPOFOL;  Surgeon: Midge Minium, MD;  Location: [facility];  Service: Endoscopy;  Laterality: N/A;  . HERNIA REPAIR      Family History  Problem Relation Age of Onset  . Atrial fibrillation Mother   . Hypertension Mother   . Diabetes Mother   . Atrial fibrillation Father     Social History   Social History  . Marital status: Married    Spouse name: N/A  . Number of children: N/A  . Years of education: N/A   Occupational History  . Not on file.   Social History Main Topics  . Smoking status: Never Smoker  . Smokeless tobacco: Never Used  . Alcohol use No  . Drug use: No  . Sexual activity: Not on file   Other Topics Concern  . Not on file   Social History Narrative   Lives at home and independent at baseline    Allergies  Allergen Reactions  . Oxycodone-Acetaminophen Itching  . Oxycodone-Acetaminophen Itching  . Ciprofloxacin Nausea And Vomiting  . Dilaudid [Hydromorphone Hcl]   . Fenofibrate     myalgia  . Sertraline Other (See Comments)    Pt prefers to never be on this again.    . Vicodin [Hydrocodone-Acetaminophen] Other (See Comments)    Heart raced.    Outpatient Medications Prior to Visit  Medication Sig Dispense Refill  . acetaminophen (TYLENOL) 500 MG tablet Take 500 mg by mouth every 6 (six) hours as needed.    . Cholecalciferol (VITAMIN D-3) 1000 units CAPS Take 1 capsule by mouth daily.    . Cyanocobalamin (B-12) 1000 MCG CAPS Take by mouth.    . cyclobenzaprine (FLEXERIL) 5 MG tablet Take 1 tablet (5 mg total) by mouth at bedtime. 15 tablet 0  . ferrous sulfate 325 (65 FE) MG tablet Take 325 mg by mouth daily with breakfast.    . ibuprofen (ADVIL,MOTRIN) 200 MG tablet Take 200 mg by mouth every 6 (six) hours as needed.    . Magnesium-Potassium 250-100 MG TABS Take 1 tablet by mouth daily.    . ondansetron (ZOFRAN ODT) 8 MG disintegrating tablet Take 1 tablet (8 mg total) by mouth every 8 (eight)  hours as needed. 6 tablet 0  . ALPRAZolam (XANAX) 0.5 MG tablet TAKE ONE TABLET BY MOUTH ONCE DAILY AS NEEDED 30 tablet 1  . hydrochlorothiazide (HYDRODIURIL) 12.5 MG tablet Take 1 tablet (12.5 mg total) by mouth daily. (Patient taking differently: Take 6.25 mg by mouth daily. ) 90 tablet 1  . metoprolol tartrate (LOPRESSOR) 25 MG tablet Take 0.5 tablets (12.5 mg total) by mouth 2 (two) times daily. 180 tablet 1  . omeprazole (PRILOSEC) 40 MG capsule Take 1 capsule (40 mg total) by mouth 2 (two) times daily. (Patient taking differently: Take 40 mg by mouth daily. ) 90 capsule 1  . simvastatin (ZOCOR) 40 MG tablet TAKE ONE TABLET BY MOUTH ONCE DAILY 30 tablet 2  . ketorolac (TORADOL) 10 MG tablet Take 1 tablet (10 mg total) by mouth every 6 (six) hours as needed. 4 tablet 0  . metoprolol tartrate (LOPRESSOR) 25 MG tablet TAKE ONE-HALF TABLET BY MOUTH TWICE DAILY 30 tablet 2   No facility-administered medications prior to visit.     Review of Systems  Constitutional: Negative for chills, diaphoresis, fatigue, fever, irritability, malaise/fatigue, weight gain and weight loss.  HENT: Negative for ear discharge,  ear pain and sore throat.   Eyes: Negative for blurred vision.  Respiratory: Negative for cough, sputum production, choking, shortness of breath and wheezing.   Cardiovascular: Negative for chest pain, palpitations, orthopnea, leg swelling and PND.  Gastrointestinal: Positive for constipation. Negative for abdominal pain, blood in stool, diarrhea, dysphagia, heartburn, melena and nausea.  Genitourinary: Negative for dysuria, frequency, hematuria and urgency.  Musculoskeletal: Negative for back pain, joint pain, myalgias, muscle weakness and neck pain.  Skin: Negative for rash.  Neurological: Negative for dizziness, tingling, sensory change, focal weakness and headaches.  Endo/Heme/Allergies: Positive for cold intolerance. Negative for environmental allergies and polydipsia. Does not  bruise/bleed easily.  Psychiatric/Behavioral: Negative for depression and suicidal ideas. The patient is nervous/anxious. The patient does not have insomnia.      Objective  Vitals:   11/25/16 0841  BP: 120/80  Pulse: 64  Weight: 214 lb (97.1 kg)  Height:  (1.803 m)    Physical Exam  Constitutional: She is well-developed, well-nourished, and in no distress. No distress.  HENT:  Head: Normocephalic and atraumatic.  Right Ear: External ear normal.  Left Ear: External ear normal.  Nose: Nose normal.  Mouth/Throat: Oropharynx is clear and moist.  Eyes: Pupils are equal, round, and reactive to light. Conjunctivae and EOM are normal. Right eye exhibits no discharge. Left eye exhibits no discharge.  Neck: Normal range of motion. Neck supple. No JVD present. No thyromegaly present.  Cardiovascular: Normal rate, regular rhythm, normal heart sounds and intact distal pulses.  Exam reveals no gallop and no friction rub.   No murmur heard. Pulmonary/Chest: Effort normal and breath sounds normal. She has no wheezes. She has no rales.  Abdominal: Soft. Bowel sounds are normal. She exhibits no mass. There is no tenderness. There is no guarding.  Musculoskeletal: Normal range of motion. She exhibits no edema.  Lymphadenopathy:    She has no cervical adenopathy.  Neurological: She is alert. She has normal reflexes.  Skin: Skin is warm and dry. She is not diaphoretic.  Psychiatric: Mood and affect normal.  Nursing note and vitals reviewed.     Assessment & Plan  Problem List Items Addressed This Visit      Digestive   Gastroesophageal reflux disease   Relevant Medications   omeprazole (PRILOSEC) 40 MG capsule     Nervous and Auditory   Anxiety disorder due to known physiological condition   Relevant Medications   ALPRAZolam (XANAX) 0.5 MG tablet     Other   Hyperlipidemia   Relevant Medications   simvastatin (ZOCOR) 40 MG tablet   metoprolol tartrate (LOPRESSOR) 25 MG  tablet   hydrochlorothiazide (HYDRODIURIL) 12.5 MG tablet   Other Relevant Orders   Lipid Profile    Other Visit Diagnoses    Essential (primary) hypertension    -  Primary   Relevant Medications   ALPRAZolam (XANAX) 0.5 MG tablet   simvastatin (ZOCOR) 40 MG tablet   metoprolol tartrate (LOPRESSOR) 25 MG tablet   hydrochlorothiazide (HYDRODIURIL) 12.5 MG tablet   Other Relevant Orders   Renal Function Panel   Fatigue, unspecified type       Relevant Orders   TSH   CBC with Differential/Platelet   Breast screening       Relevant Orders   MM Digital Screening   Abnormal liver function test       Relevant Orders   Hepatic function panel      Meds ordered this encounter  Medications  . ALPRAZolam Prudy Feeler)  0.5 MG tablet    Sig: TAKE ONE TABLET BY MOUTH ONCE DAILY AS NEEDED    Dispense:  30 tablet    Refill:  2  . simvastatin (ZOCOR) 40 MG tablet    Sig: Take 1 tablet (40 mg total) by mouth daily.    Dispense:  90 tablet    Refill:  1    Please consider 90 day supplies to promote better adherence  . metoprolol tartrate (LOPRESSOR) 25 MG tablet    Sig: Take 0.5 tablets (12.5 mg total) by mouth 2 (two) times daily.    Dispense:  180 tablet    Refill:  1  . hydrochlorothiazide (HYDRODIURIL) 12.5 MG tablet    Sig: Take 1 tablet (12.5 mg total) by mouth daily.    Dispense:  90 tablet    Refill:  1  . omeprazole (PRILOSEC) 40 MG capsule    Sig: Take 1 capsule (40 mg total) by mouth 2 (two) times daily.    Dispense:  90 capsule    Refill:  1      Dr. Elizabeth Sauer [facility]  11/25/16

## 2016-11-26 LAB — LIPID PANEL
CHOLESTEROL TOTAL: 178 mg/dL (ref 100–199)
Chol/HDL Ratio: 3.9 ratio (ref 0.0–4.4)
HDL: 46 mg/dL (ref >39)
LDL Calculated: 101 mg/dL — ABNORMAL HIGH (ref 0–99)
TRIGLYCERIDES: 154 mg/dL — AB (ref 0–149)
VLDL CHOLESTEROL CAL: 31 mg/dL (ref 5–40)

## 2016-11-26 LAB — RENAL FUNCTION PANEL
ALBUMIN: 4.1 g/dL (ref 3.5–5.5)
BUN/Creatinine Ratio: 14 (ref 9–23)
BUN: 10 mg/dL (ref 6–24)
CALCIUM: 9.1 mg/dL (ref 8.7–10.2)
CHLORIDE: 104 mmol/L (ref 96–106)
CO2: 25 mmol/L (ref 20–29)
CREATININE: 0.69 mg/dL (ref 0.57–1.00)
GFR calc non Af Amer: 100 mL/min/{1.73_m2} (ref >59)
GFR, EST AFRICAN AMERICAN: 115 mL/min/{1.73_m2} (ref >59)
Glucose: 95 mg/dL (ref 65–99)
Phosphorus: 3.5 mg/dL (ref 2.5–4.5)
Potassium: 4 mmol/L (ref 3.5–5.2)
Sodium: 144 mmol/L (ref 134–144)

## 2016-11-26 LAB — CBC WITH DIFFERENTIAL/PLATELET
BASOS ABS: 0 10*3/uL (ref 0.0–0.2)
Basos: 0 %
EOS (ABSOLUTE): 0.1 10*3/uL (ref 0.0–0.4)
Eos: 1 %
Hematocrit: 37.2 % (ref 34.0–46.6)
Hemoglobin: 12.3 g/dL (ref 11.1–15.9)
IMMATURE GRANS (ABS): 0 10*3/uL (ref 0.0–0.1)
IMMATURE GRANULOCYTES: 0 %
LYMPHS: 21 %
Lymphocytes Absolute: 1.8 10*3/uL (ref 0.7–3.1)
MCH: 28.5 pg (ref 26.6–33.0)
MCHC: 33.1 g/dL (ref 31.5–35.7)
MCV: 86 fL (ref 79–97)
MONOCYTES: 7 %
Monocytes Absolute: 0.6 10*3/uL (ref 0.1–0.9)
NEUTROS PCT: 71 %
Neutrophils Absolute: 6 10*3/uL (ref 1.4–7.0)
PLATELETS: 242 10*3/uL (ref 150–379)
RBC: 4.31 x10E6/uL (ref 3.77–5.28)
RDW: 13.7 % (ref 12.3–15.4)
WBC: 8.6 10*3/uL (ref 3.4–10.8)

## 2016-11-26 LAB — TSH: TSH: 0.914 u[IU]/mL (ref 0.450–4.500)

## 2016-12-04 LAB — HEPATIC FUNCTION PANEL
ALBUMIN: 4.3 g/dL (ref 3.5–5.5)
ALT: 23 IU/L (ref 0–32)
AST: 20 IU/L (ref 0–40)
Alkaline Phosphatase: 108 IU/L (ref 39–117)
BILIRUBIN TOTAL: 0.5 mg/dL (ref 0.0–1.2)
Bilirubin, Direct: 0.13 mg/dL (ref 0.00–0.40)
TOTAL PROTEIN: 6.8 g/dL (ref 6.0–8.5)

## 2016-12-04 LAB — SPECIMEN STATUS REPORT

## 2016-12-22 ENCOUNTER — Other Ambulatory Visit: Payer: Self-pay | Admitting: Family Medicine

## 2016-12-22 ENCOUNTER — Ambulatory Visit
Admission: RE | Admit: 2016-12-22 | Discharge: 2016-12-22 | Disposition: A | Discharge status: Home / Self Care | Payer: BC Managed Care – PPO | Source: Ambulatory Visit | Attending: Family Medicine | Admitting: Family Medicine

## 2016-12-22 DIAGNOSIS — Z1231 Encounter for screening mammogram for malignant neoplasm of breast: Secondary | ICD-10-CM | POA: Diagnosis present

## 2016-12-22 DIAGNOSIS — Z1239 Encounter for other screening for malignant neoplasm of breast: Secondary | ICD-10-CM

## 2017-03-28 ENCOUNTER — Encounter: Payer: Self-pay | Admitting: Family Medicine

## 2017-03-28 ENCOUNTER — Ambulatory Visit: Payer: BC Managed Care – PPO | Admitting: Family Medicine

## 2017-03-28 VITALS — BP 122/80 | HR 76 | Ht 71.0 in | Wt 210.0 lb

## 2017-03-28 DIAGNOSIS — N912 Amenorrhea, unspecified: Secondary | ICD-10-CM | POA: Diagnosis not present

## 2017-03-28 DIAGNOSIS — Z23 Encounter for immunization: Secondary | ICD-10-CM | POA: Diagnosis not present

## 2017-03-28 DIAGNOSIS — E010 Iodine-deficiency related diffuse (endemic) goiter: Secondary | ICD-10-CM | POA: Diagnosis not present

## 2017-03-28 NOTE — Progress Notes (Signed)
Name: Sherri Matthews   MRN: [id number]    DOB: 1965-07-11   Date:03/28/2017       Progress Note  Subjective  Chief Complaint  Chief Complaint  Patient presents with  . Amenorrhea    prolactin level- spotting since Friday  . Thyroid Problem    L) side of neck has a swollen place beside thyroid    Thyroid Problem  Presents for initial visit. Symptoms include anxiety, diarrhea, fatigue, hair loss, heat intolerance and menstrual problem. Patient reports no cold intolerance, constipation, depressed mood, diaphoresis, dry skin, hoarse voice, leg swelling, nail problem, palpitations, tremors, visual change, weight gain or weight loss. (Urticaria/throat closing up) Past treatments include nothing. The following procedures have not been performed: thyroid ultrasound.    No problem-specific Assessment & Plan notes found for this encounter.   Past Medical History:  Diagnosis Date  . Anxiety   . AV node dysfunction    tachycardia sees Dr. Lady Gary  . Cervical disc disease    degenerative disk   . Chronic abdominal pain   . Heart murmur   . Hyperlipidemia   . Hypertension   . IBS (irritable bowel syndrome)   . Mitral valve prolapse   . Palpitations   . Sleep apnea    possible but not tested    Past Surgical History:  Procedure Laterality Date  . APPENDECTOMY  2008  . CARDIAC CATHETERIZATION N/A 09/12/2015   Procedure: Left Heart Cath and Coronary Angiography;  Surgeon: Marcina Millard, MD;  Location: [facility];  Service: Cardiovascular;  Laterality: N/A;  . ESOPHAGOGASTRODUODENOSCOPY (EGD) WITH PROPOFOL N/A 04/18/2015   Procedure: ESOPHAGOGASTRODUODENOSCOPY (EGD) WITH PROPOFOL;  Surgeon: Midge Minium, MD;  Location: [facility];  Service: Endoscopy;  Laterality: N/A;  . HERNIA REPAIR      Family History  Problem Relation Age of Onset  . Atrial fibrillation Mother   . Hypertension Mother   . Diabetes Mother   . Atrial fibrillation Father   . Breast cancer  Paternal Aunt        pat great aunt    Social History   Socioeconomic History  . Marital status: Married    Spouse name: Not on file  . Number of children: Not on file  . Years of education: Not on file  . Highest education level: Not on file  Social Needs  . Financial resource strain: Not on file  . Food insecurity - worry: Not on file  . Food insecurity - inability: Not on file  . Transportation needs - medical: Not on file  . Transportation needs - non-medical: Not on file  Occupational History  . Not on file  Tobacco Use  . Smoking status: Never Smoker  . Smokeless tobacco: Never Used  Substance and Sexual Activity  . Alcohol use: No    Alcohol/week: 0.0 oz  . Drug use: No  . Sexual activity: Not on file  Other Topics Concern  . Not on file  Social History Narrative   Lives at home and independent at baseline    Allergies  Allergen Reactions  . Oxycodone-Acetaminophen Itching  . Oxycodone-Acetaminophen Itching  . Ciprofloxacin Nausea And Vomiting  . Dilaudid [Hydromorphone Hcl]   . Fenofibrate     myalgia  . Sertraline Other (See Comments)    Pt prefers to never be on this again.    . Vicodin [Hydrocodone-Acetaminophen] Other (See Comments)    Heart raced.    Outpatient Medications Prior to Visit  Medication Sig  Dispense Refill  . acetaminophen (TYLENOL) 500 MG tablet Take 500 mg by mouth every 6 (six) hours as needed.    . ALPRAZolam (XANAX) 0.5 MG tablet TAKE ONE TABLET BY MOUTH ONCE DAILY AS NEEDED 30 tablet 2  . Cholecalciferol (VITAMIN D-3) 1000 units CAPS Take 1 capsule by mouth daily.    . Cyanocobalamin (B-12) 1000 MCG CAPS Take by mouth.    . cyclobenzaprine (FLEXERIL) 5 MG tablet Take 1 tablet (5 mg total) by mouth at bedtime. 15 tablet 0  . ferrous sulfate 325 (65 FE) MG tablet Take 325 mg by mouth daily with breakfast.    . hydrochlorothiazide (HYDRODIURIL) 12.5 MG tablet Take 1 tablet (12.5 mg total) by mouth daily. 90 tablet 1  . ibuprofen  (ADVIL,MOTRIN) 200 MG tablet Take 200 mg by mouth every 6 (six) hours as needed.    . Magnesium-Potassium 250-100 MG TABS Take 1 tablet by mouth daily.    . metoprolol tartrate (LOPRESSOR) 25 MG tablet Take 0.5 tablets (12.5 mg total) by mouth 2 (two) times daily. 180 tablet 1  . Multiple Vitamins-Minerals (ONE-A-DAY WOMENS 50 PLUS PO) Take 1 tablet by mouth daily.    Marland Kitchen. omeprazole (PRILOSEC) 40 MG capsule Take 1 capsule (40 mg total) by mouth 2 (two) times daily. 90 capsule 1  . ondansetron (ZOFRAN ODT) 8 MG disintegrating tablet Take 1 tablet (8 mg total) by mouth every 8 (eight) hours as needed. 6 tablet 0  . simvastatin (ZOCOR) 40 MG tablet Take 1 tablet (40 mg total) by mouth daily. 90 tablet 1   No facility-administered medications prior to visit.     Review of Systems  Constitutional: Positive for fatigue. Negative for chills, diaphoresis, fever, malaise/fatigue, weight gain and weight loss.  HENT: Negative for ear discharge, ear pain, hoarse voice and sore throat.   Eyes: Negative for blurred vision.  Respiratory: Negative for cough, sputum production, shortness of breath and wheezing.   Cardiovascular: Negative for chest pain, palpitations and leg swelling.  Gastrointestinal: Positive for diarrhea. Negative for abdominal pain, blood in stool, constipation, heartburn, melena and nausea.  Genitourinary: Positive for menstrual problem. Negative for dysuria, frequency, hematuria and urgency.  Musculoskeletal: Negative for back pain, joint pain, myalgias and neck pain.  Skin: Negative for rash.  Neurological: Negative for dizziness, tingling, tremors, sensory change, focal weakness and headaches.  Endo/Heme/Allergies: Positive for heat intolerance. Negative for environmental allergies, cold intolerance and polydipsia. Does not bruise/bleed easily.  Psychiatric/Behavioral: Negative for depression and suicidal ideas. The patient is nervous/anxious. The patient does not have insomnia.       Objective  Vitals:   03/28/17 1522  BP: 122/80  Pulse: 76  Weight: 210 lb (95.3 kg)  Height: 5\' 11"  (1.803 m)    Physical Exam  Constitutional: She is well-developed, well-nourished, and in no distress. No distress.  HENT:  Head: Normocephalic and atraumatic.  Right Ear: External ear normal.  Left Ear: External ear normal.  Nose: Nose normal.  Mouth/Throat: Oropharynx is clear and moist.  Eyes: Conjunctivae and EOM are normal. Pupils are equal, round, and reactive to light. Right eye exhibits no discharge. Left eye exhibits no discharge.  Neck: Normal range of motion. Neck supple. Normal carotid pulses, no hepatojugular reflux and no JVD present. Carotid bruit is not present. Thyromegaly present. No thyroid mass present.  Cardiovascular: Normal rate, regular rhythm, normal heart sounds and intact distal pulses. Exam reveals no gallop and no friction rub.  No murmur heard. Pulmonary/Chest: Effort normal and  breath sounds normal. She has no wheezes. She has no rales.  Abdominal: Soft. Bowel sounds are normal. She exhibits no mass. There is no tenderness. There is no guarding.  Musculoskeletal: Normal range of motion. She exhibits no edema.  Lymphadenopathy:       Head (right side): No submental and no submandibular adenopathy present.       Head (left side): No submental and no submandibular adenopathy present.    She has no cervical adenopathy.       Right cervical: No superficial cervical adenopathy present.      Left cervical: No superficial cervical adenopathy present.  Neurological: She is alert. She has normal reflexes.  Skin: Skin is warm and dry. She is not diaphoretic.  Psychiatric: Mood and affect normal.  Nursing note and vitals reviewed.     Assessment & Plan  Problem List Items Addressed This Visit    None    Visit Diagnoses    Thyromegaly    -  Primary   Relevant Orders   Thyroid Panel With TSH   US Soft Tissue Head/Neck   Influenza vaccine needed        Relevant Orders   Flu Vaccine QUAD 36+ mos IM (Completed)   Amenorrhea       Relevant Orders   FSH/LH      No orders of the defined types were placed in this encounter.     Dr. Hayden [facility]  03/28/17

## 2017-03-29 LAB — FSH/LH
FSH: 17.4 m[IU]/mL
LH: 7.5 m[IU]/mL

## 2017-03-29 LAB — THYROID PANEL WITH TSH
FREE THYROXINE INDEX: 1.6 (ref 1.2–4.9)
T3 Uptake Ratio: 21 % — ABNORMAL LOW (ref 24–39)
T4, Total: 7.8 ug/dL (ref 4.5–12.0)
TSH: 1.47 u[IU]/mL (ref 0.450–4.500)

## 2017-03-30 ENCOUNTER — Ambulatory Visit
Admission: RE | Admit: 2017-03-30 | Discharge: 2017-03-30 | Disposition: A | Discharge status: Home / Self Care | Payer: BC Managed Care – PPO | Source: Ambulatory Visit | Attending: Family Medicine | Admitting: Family Medicine

## 2017-03-30 DIAGNOSIS — E010 Iodine-deficiency related diffuse (endemic) goiter: Secondary | ICD-10-CM | POA: Diagnosis present

## 2017-04-04 ENCOUNTER — Ambulatory Visit: Payer: BC Managed Care – PPO

## 2017-04-04 ENCOUNTER — Other Ambulatory Visit: Payer: Self-pay

## 2017-04-04 DIAGNOSIS — R6889 Other general symptoms and signs: Secondary | ICD-10-CM

## 2017-04-04 DIAGNOSIS — R0989 Other specified symptoms and signs involving the circulatory and respiratory systems: Secondary | ICD-10-CM

## 2017-05-11 ENCOUNTER — Encounter: Payer: Self-pay | Admitting: Emergency Medicine

## 2017-05-11 ENCOUNTER — Ambulatory Visit
Admission: EM | Admit: 2017-05-11 | Discharge: 2017-05-11 | Disposition: A | Discharge status: Home / Self Care | Payer: BC Managed Care – PPO | Attending: Family Medicine | Admitting: Family Medicine

## 2017-05-11 ENCOUNTER — Other Ambulatory Visit: Payer: Self-pay

## 2017-05-11 DIAGNOSIS — J029 Acute pharyngitis, unspecified: Secondary | ICD-10-CM | POA: Diagnosis not present

## 2017-05-11 DIAGNOSIS — B9789 Other viral agents as the cause of diseases classified elsewhere: Secondary | ICD-10-CM

## 2017-05-11 LAB — RAPID STREP SCREEN (MED CTR MEBANE ONLY): STREPTOCOCCUS, GROUP A SCREEN (DIRECT): NEGATIVE

## 2017-05-11 NOTE — ED Triage Notes (Signed)
Patient in tonight c/o sore throat since today. Patient denies fever.

## 2017-05-11 NOTE — ED Provider Notes (Signed)
[facility]    CSN: [id number] Arrival date & time: 05/11/17  1935     History   Chief Complaint Chief Complaint  Patient presents with  . Sore Throat    HPI Sherri Matthews is a 52 y.o. female.   The history is provided by the patient.  Sore Throat  This is a new problem. The current episode started 6 to 12 hours ago. The problem occurs constantly. The problem has not changed since onset.Pertinent negatives include no chest pain, no abdominal pain, no headaches and no shortness of breath. Nothing relieves the symptoms.  URI  Associated symptoms: no headaches     Past Medical History:  Diagnosis Date  . Anxiety   . AV node dysfunction    tachycardia sees Dr. Lady Gary  . Cervical disc disease    degenerative disk   . Chronic abdominal pain   . Heart murmur   . Hyperlipidemia   . Hypertension   . IBS (irritable bowel syndrome)   . Mitral valve prolapse   . Palpitations   . Sleep apnea    possible but not tested    Patient Active Problem List   Diagnosis Date Noted  . Chest pain 06/19/2016  . Abnormal EKG 06/19/2016  . Menopause 01/28/2016  . Essential hypertension 05/15/2015  . Hyperlipidemia 05/15/2015  . Gastritis   . 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 Heart murmur 07/16/2014  . Gastroesophageal reflux disease 07/16/2014  . H/O: osteoarthritis 07/16/2014    Past Surgical History:  Procedure Laterality Date  . APPENDECTOMY  2008  . CARDIAC CATHETERIZATION N/A 09/12/2015   Procedure: Left Heart Cath and Coronary Angiography;  Surgeon: Marcina Millard, MD;  Location: [facility];  Service: Cardiovascular;  Laterality: N/A;  . ESOPHAGOGASTRODUODENOSCOPY (EGD) WITH PROPOFOL N/A 04/18/2015   Procedure: ESOPHAGOGASTRODUODENOSCOPY (EGD) WITH PROPOFOL;  Surgeon: Midge Minium, MD;   Location: [facility];  Service: Endoscopy;  Laterality: N/A;  . HERNIA REPAIR      OB History    No data available       Home Medications    Prior to Admission medications   Medication Sig Start Date End Date Taking? Authorizing Provider  ALPRAZolam Prudy Feeler) 0.5 MG tablet TAKE ONE TABLET BY MOUTH ONCE DAILY AS NEEDED 11/25/16  Yes Duanne Limerick, MD  Cholecalciferol (VITAMIN D-3) 1000 units CAPS Take 1 capsule by mouth daily.   Yes [provider]  Cyanocobalamin (B-12) 1000 MCG CAPS Take by mouth.   Yes [provider]  ferrous sulfate 325 (65 FE) MG tablet Take 325 mg by mouth daily with breakfast.   Yes [provider]  hydrochlorothiazide (HYDRODIURIL) 12.5 MG tablet Take 1 tablet (12.5 mg total) by mouth daily. 11/25/16  Yes Duanne Limerick, MD  ibuprofen (ADVIL,MOTRIN) 200 MG tablet Take 200 mg by mouth every 6 (six) hours as needed.   Yes [provider]  magnesium oxide (MAG-OX) 400 MG tablet Take 400 mg by mouth daily.   Yes [provider]  metoprolol tartrate (LOPRESSOR) 25 MG tablet Take 0.5 tablets (12.5 mg total) by mouth 2 (two) times daily. 11/25/16  Yes Duanne Limerick, MD  Multiple Vitamins-Minerals (ONE-A-DAY WOMENS 50 PLUS PO) Take 1 tablet by mouth daily.   Yes [provider]  omeprazole (PRILOSEC) 40 MG capsule Take 1 capsule (40 mg total) by mouth 2 (two)  times daily. 11/25/16  Yes Duanne LimerickJones, Deanna C, MD  simvastatin (ZOCOR) 40 MG tablet Take 1 tablet (40 mg total) by mouth daily. 11/25/16  Yes Duanne LimerickJones, Deanna C, MD  acetaminophen (TYLENOL) 500 MG tablet Take 500 mg by mouth every 6 (six) hours as needed.    [provider]  cyclobenzaprine (FLEXERIL) 5 MG tablet Take 1 tablet (5 mg total) by mouth at bedtime. 11/09/16   Payton Mccallumonty, Raymar Joiner, MD  Magnesium-Potassium 250-100 MG TABS Take 1 tablet by mouth daily.    [provider]  ondansetron (ZOFRAN ODT) 8 MG disintegrating tablet Take 1 tablet (8 mg  total) by mouth every 8 (eight) hours as needed. 11/09/16   Payton Mccallumonty, Clatie Kessen, MD    Family History Family History  Problem Relation Age of Onset  . Atrial fibrillation Mother   . Hypertension Mother   . Diabetes Mother   . Atrial fibrillation Father   . Breast cancer Paternal Aunt        pat great aunt    Social History Social History   Tobacco Use  . Smoking status: Never Smoker  . Smokeless tobacco: Never Used  Substance Use Topics  . Alcohol use: No    Alcohol/week: 0.0 oz  . Drug use: No     Allergies   Oxycodone-acetaminophen; Oxycodone-acetaminophen; Ciprofloxacin; Dilaudid [hydromorphone hcl]; Fenofibrate; Sertraline; and Vicodin [hydrocodone-acetaminophen]   Review of Systems Review of Systems  Respiratory: Negative for shortness of breath.   Cardiovascular: Negative for chest pain.  Gastrointestinal: Negative for abdominal pain.  Neurological: Negative for headaches.     Physical Exam Triage Vital Signs ED Triage Vitals  Enc Vitals Group     BP 05/11/17 1945 139/88     Pulse Rate 05/11/17 1945 66     Resp 05/11/17 1945 16     Temp 05/11/17 1945 99 F (37.2 C)     Temp Source 05/11/17 1945 Oral     SpO2 05/11/17 1945 100 %     Weight 05/11/17 1946 210 lb (95.3 kg)     Height 05/11/17 1946 5\' 11"  (1.803 m)     Head Circumference --      Peak Flow --      Pain Score 05/11/17 1946 3     Pain Loc --      Pain Edu? --      Excl. in GC? --    No data found.  Updated Vital Signs BP 139/88 (BP Location: Left Arm)   Pulse 66   Temp 99 F (37.2 C) (Oral)   Resp 16   Ht 5\' 11"  (1.803 m)   Wt 210 lb (95.3 kg)   LMP 04/18/2017 (Approximate)   SpO2 100%   BMI 29.29 kg/m   Visual Acuity Right Eye Distance:   Left Eye Distance:   Bilateral Distance:    Right Eye Near:   Left Eye Near:    Bilateral Near:     Physical Exam  Constitutional: She appears well-developed and well-nourished. No distress.  HENT:  Head: Normocephalic and atraumatic.    Right Ear: Tympanic membrane, external ear and ear canal normal.  Left Ear: Tympanic membrane, external ear and ear canal normal.  Nose: No mucosal edema, rhinorrhea, nose lacerations, sinus tenderness, nasal deformity, septal deviation or nasal septal hematoma. No epistaxis.  No foreign bodies. Right sinus exhibits no maxillary sinus tenderness and no frontal sinus tenderness. Left sinus exhibits no maxillary sinus tenderness and no frontal sinus tenderness.  Mouth/Throat: Uvula is midline and  mucous membranes are normal. Posterior oropharyngeal erythema present. No oropharyngeal exudate, posterior oropharyngeal edema or tonsillar abscesses. No tonsillar exudate.  Eyes: Conjunctivae are normal. Right eye exhibits no discharge. Left eye exhibits no discharge. No scleral icterus.  Neck: Normal range of motion. Neck supple. No thyromegaly present.  Cardiovascular: Normal rate, regular rhythm and normal heart sounds.  Pulmonary/Chest: Effort normal and breath sounds normal. No stridor. No respiratory distress. She has no wheezes. She has no rales.  Lymphadenopathy:    She has no cervical adenopathy.  Skin: She is not diaphoretic.  Nursing note and vitals reviewed.    UC Treatments / Results  Labs (all labs ordered are listed, but only abnormal results are displayed) Labs Reviewed  RAPID STREP SCREEN (NOT AT [facility])  CULTURE, GROUP A STREP [facility])    EKG  EKG Interpretation None       Radiology No results found.  Procedures Procedures (including critical care time)  Medications Ordered in UC Medications - No data to display   Initial Impression / Assessment and Plan / UC Course  I have reviewed the triage vital signs and the nursing notes.  Pertinent labs & imaging results that were available during my care of the patient were reviewed by me and considered in my medical decision making (see chart for details).       Final Clinical Impressions(s) / UC Diagnoses   Final  diagnoses:  Viral pharyngitis    ED Discharge Orders    None     1. Lab results and diagnosis reviewed with patient 2. Recommend supportive treatment with salt water gargles, otc analgesics, rest 3. Follow-up prn if symptoms worsen or don't improve  Controlled Substance Prescriptions [place] Controlled Substance Registry consulted? Not Applicable   Payton Mccallum, MD 05/11/17 2022

## 2017-05-11 NOTE — Discharge Instructions (Signed)
Rest, fluids, over the counter analgesics  °

## 2017-05-14 LAB — CULTURE, GROUP A STREP (THRC)

## 2017-05-31 ENCOUNTER — Encounter: Payer: Self-pay | Admitting: Family Medicine

## 2017-05-31 ENCOUNTER — Ambulatory Visit: Payer: BC Managed Care – PPO | Admitting: Family Medicine

## 2017-05-31 VITALS — BP 124/80 | HR 76 | Ht 71.0 in | Wt 220.0 lb

## 2017-05-31 DIAGNOSIS — J029 Acute pharyngitis, unspecified: Secondary | ICD-10-CM

## 2017-05-31 DIAGNOSIS — I341 Nonrheumatic mitral (valve) prolapse: Secondary | ICD-10-CM

## 2017-05-31 DIAGNOSIS — R000 Tachycardia, unspecified: Secondary | ICD-10-CM

## 2017-05-31 DIAGNOSIS — R002 Palpitations: Secondary | ICD-10-CM

## 2017-05-31 LAB — POCT RAPID STREP A (OFFICE): RAPID STREP A SCREEN: NEGATIVE

## 2017-05-31 NOTE — Progress Notes (Signed)
Name: Sherri GoldsLisa Wilborn Matthews   MRN: [id number]    DOB: Jul 03, 1965   Date:05/31/2017       Progress Note  Subjective  Chief Complaint  Chief Complaint  Patient presents with  . Sore Throat    R) side of throat is sore  . Palpitations    "can feel it in my neck starting and stopping since lunchtime yesterday"     Sore Throat   This is a new problem. The current episode started in the past 7 days (saturday). The problem has been waxing and waning. The pain is worse on the right side. There has been no fever. The pain is mild. Associated symptoms include ear pain, headaches and a plugged ear sensation. Pertinent negatives include no congestion, coughing, ear discharge, hoarse voice, neck pain, shortness of breath, swollen glands, trouble swallowing or vomiting. She has had no exposure to strep or mono. She has tried acetaminophen for the symptoms. The treatment provided mild relief.  Palpitations   This is a chronic (atrial nodal tachycardia) problem. The current episode started more than 1 year ago. The problem occurs daily. The problem has been waxing and waning. Nothing aggravates the symptoms. Pertinent negatives include no anxiety, chest fullness, chest pain, coughing, diaphoresis, dizziness, fever, irregular heartbeat, malaise/fatigue, nausea, near-syncope, numbness, shortness of breath, syncope, vomiting or weakness. She has tried beta blockers for the symptoms. The treatment provided moderate relief. There are no known risk factors. Her past medical history is significant for a valve disorder. There is no history of anemia, anxiety, drug use, heart disease or hyperthyroidism.    No problem-specific Assessment & Plan notes found for this encounter.   Past Medical History:  Diagnosis Date  . Anxiety   . AV node dysfunction    tachycardia sees Dr. Lady GaryFath  . Cervical disc disease    degenerative disk   . Chronic abdominal pain   . Heart murmur   . Hyperlipidemia   . Hypertension   . IBS  (irritable bowel syndrome)   . Mitral valve prolapse   . Palpitations   . Sleep apnea    possible but not tested    Past Surgical History:  Procedure Laterality Date  . APPENDECTOMY  2008  . CARDIAC CATHETERIZATION N/A 09/12/2015   Procedure: Left Heart Cath and Coronary Angiography;  Surgeon: Marcina MillardAlexander Paraschos, MD;  Location: [facility];  Service: Cardiovascular;  Laterality: N/A;  . ESOPHAGOGASTRODUODENOSCOPY (EGD) WITH PROPOFOL N/A 04/18/2015   Procedure: ESOPHAGOGASTRODUODENOSCOPY (EGD) WITH PROPOFOL;  Surgeon: Midge Miniumarren Wohl, MD;  [facility];  Service: Endoscopy;  Laterality: N/A;  . HERNIA REPAIR      Family History  Problem Relation Age of Onset  . Atrial fibrillation Mother   . Hypertension Mother   . Diabetes Mother   . Atrial fibrillation Father   . Breast cancer Paternal Aunt        pat great aunt    Social History   Socioeconomic History  . Marital status: Married    Spouse name: Not on file  . Number of children: Not on file  . Years of education: Not on file  . Highest education level: Not on file  Occupational History  . Not on file  Social Needs  . Financial resource strain: Not on file  . Food insecurity:    Worry: Not on file    Inability: Not on file  . Transportation needs:    Medical: Not on file    Non-medical: Not on  file  Tobacco Use  . Smoking status: Never Smoker  . Smokeless tobacco: Never Used  Substance and Sexual Activity  . Alcohol use: No    Alcohol/week: 0.0 oz  . Drug use: No  . Sexual activity: Not on file  Lifestyle  . Physical activity:    Days per week: Not on file    Minutes per session: Not on file  . Stress: Not on file  Relationships  . Social connections:    Talks on phone: Not on file    Gets together: Not on file    Attends religious service: Not on file    Active member of club or organization: Not on file    Attends meetings of clubs or organizations: Not on file    Relationship  status: Not on file  . Intimate partner violence:    Fear of current or ex partner: Not on file    Emotionally abused: Not on file    Physically abused: Not on file    Forced sexual activity: Not on file  Other Topics Concern  . Not on file  Social History Narrative   Lives at home and independent at baseline    Allergies  Allergen Reactions  . Oxycodone-Acetaminophen Itching  . Oxycodone-Acetaminophen Itching  . Ciprofloxacin Nausea And Vomiting  . Dilaudid [Hydromorphone Hcl]   . Fenofibrate     myalgia  . Sertraline Other (See Comments)    Pt prefers to never be on this again.    . Vicodin [Hydrocodone-Acetaminophen] Other (See Comments)    Heart raced.    Outpatient Medications Prior to Visit  Medication Sig Dispense Refill  . acetaminophen (TYLENOL) 500 MG tablet Take 500 mg by mouth every 6 (six) hours as needed.    . ALPRAZolam (XANAX) 0.5 MG tablet TAKE ONE TABLET BY MOUTH ONCE DAILY AS NEEDED 30 tablet 2  . Cholecalciferol (VITAMIN D-3) 1000 units CAPS Take 1 capsule by mouth daily.    . Cyanocobalamin (B-12) 1000 MCG CAPS Take by mouth.    . cyclobenzaprine (FLEXERIL) 5 MG tablet Take 1 tablet (5 mg total) by mouth at bedtime. 15 tablet 0  . ferrous sulfate 325 (65 FE) MG tablet Take 325 mg by mouth daily with breakfast.    . hydrochlorothiazide (HYDRODIURIL) 12.5 MG tablet Take 1 tablet (12.5 mg total) by mouth daily. 90 tablet 1  . ibuprofen (ADVIL,MOTRIN) 200 MG tablet Take 200 mg by mouth every 6 (six) hours as needed.    . magnesium oxide (MAG-OX) 400 MG tablet Take 400 mg by mouth daily.    . metoprolol tartrate (LOPRESSOR) 25 MG tablet Take 0.5 tablets (12.5 mg total) by mouth 2 (two) times daily. 180 tablet 1  . Multiple Vitamins-Minerals (ONE-A-DAY WOMENS 50 PLUS PO) Take 1 tablet by mouth daily.    Marland Kitchen omeprazole (PRILOSEC) 40 MG capsule Take 1 capsule (40 mg total) by mouth 2 (two) times daily. 90 capsule 1  . simvastatin (ZOCOR) 40 MG tablet Take 1 tablet  (40 mg total) by mouth daily. 90 tablet 1  . Magnesium-Potassium 250-100 MG TABS Take 1 tablet by mouth daily.    . ondansetron (ZOFRAN ODT) 8 MG disintegrating tablet Take 1 tablet (8 mg total) by mouth every 8 (eight) hours as needed. 6 tablet 0   No facility-administered medications prior to visit.     Review of Systems  Constitutional: Negative for diaphoresis, fever and malaise/fatigue.  HENT: Positive for ear pain. Negative for congestion, ear discharge, hoarse  voice and trouble swallowing.   Respiratory: Negative for cough and shortness of breath.   Cardiovascular: Positive for palpitations. Negative for chest pain, syncope and near-syncope.  Gastrointestinal: Negative for nausea and vomiting.  Musculoskeletal: Negative for neck pain.  Neurological: Positive for headaches. Negative for dizziness, weakness and numbness.  Psychiatric/Behavioral: The patient is not nervous/anxious.      Objective  Vitals:   05/31/17 0927  BP: 124/80  Pulse: 76  Weight: 220 lb (99.8 kg)  Height: 5\' 11"  (1.803 m)    Physical Exam  Constitutional: She is well-developed, well-nourished, and in no distress. No distress.  HENT:  Head: Normocephalic and atraumatic.  Right Ear: External ear normal.  Left Ear: External ear normal.  Nose: Nose normal.  Mouth/Throat: Posterior oropharyngeal erythema present. No oropharyngeal exudate or posterior oropharyngeal edema.  Eyes: Pupils are equal, round, and reactive to light. Conjunctivae and EOM are normal. Right eye exhibits no discharge. Left eye exhibits no discharge.  Neck: Normal range of motion. Neck supple. No JVD present. No thyromegaly present.  Cardiovascular: Normal rate, regular rhythm, S1 normal, S2 normal, normal heart sounds and intact distal pulses. PMI is not displaced. Exam reveals no gallop, no S3, no S4, no friction rub and no decreased pulses.  No murmur heard. Hx MVP  Pulmonary/Chest: Effort normal and breath sounds normal. She has  no wheezes. She has no rales.  Abdominal: Soft. Bowel sounds are normal. She exhibits no mass. There is no tenderness. There is no guarding.  Musculoskeletal: Normal range of motion. She exhibits no edema.  Lymphadenopathy:       Head (right side): Submandibular adenopathy present.       Head (left side): Submandibular adenopathy present.    She has no cervical adenopathy.  tender  Neurological: She is alert. She has normal reflexes.  Skin: Skin is warm and dry. She is not diaphoretic.  Psychiatric: Mood and affect normal.  Nursing note and vitals reviewed.     Assessment & Plan  Problem List Items Addressed This Visit    None    Visit Diagnoses    Palpitations    -  Primary   Relevant Orders   EKG 12-Lead (Completed)   TSH   Ambulatory referral to Cardiology   Pharyngitis, unspecified etiology       Relevant Orders   POCT rapid strep A (Completed)   Tachycardia       Relevant Orders   TSH   Ambulatory referral to Cardiology   MVP (mitral valve prolapse)         Pt's strep test is negative- keep an eye on it/ no treatment at this time. No orders of the defined types were placed in this encounter.     Dr. Hayden [facility]  05/31/17

## 2017-06-01 LAB — TSH: TSH: 0.809 u[IU]/mL (ref 0.450–4.500)

## 2017-06-08 ENCOUNTER — Other Ambulatory Visit: Payer: Self-pay | Admitting: Family Medicine

## 2017-06-08 DIAGNOSIS — I10 Essential (primary) hypertension: Secondary | ICD-10-CM

## 2017-06-08 DIAGNOSIS — K219 Gastro-esophageal reflux disease without esophagitis: Secondary | ICD-10-CM

## 2017-07-25 ENCOUNTER — Ambulatory Visit
Admission: EM | Admit: 2017-07-25 | Discharge: 2017-07-25 | Disposition: A | Discharge status: Home / Self Care | Payer: BC Managed Care – PPO | Attending: Family Medicine | Admitting: Family Medicine

## 2017-07-25 ENCOUNTER — Encounter: Payer: Self-pay | Admitting: Emergency Medicine

## 2017-07-25 ENCOUNTER — Other Ambulatory Visit: Payer: Self-pay

## 2017-07-25 DIAGNOSIS — J029 Acute pharyngitis, unspecified: Secondary | ICD-10-CM | POA: Diagnosis not present

## 2017-07-25 DIAGNOSIS — B349 Viral infection, unspecified: Secondary | ICD-10-CM | POA: Diagnosis not present

## 2017-07-25 DIAGNOSIS — R5383 Other fatigue: Secondary | ICD-10-CM | POA: Diagnosis not present

## 2017-07-25 LAB — RAPID STREP SCREEN (MED CTR MEBANE ONLY): Streptococcus, Group A Screen (Direct): NEGATIVE

## 2017-07-25 MED ORDER — LIDOCAINE VISCOUS HCL 2 % MT SOLN: OROMUCOSAL | 0 refills | Status: DC

## 2017-07-25 NOTE — ED Triage Notes (Signed)
Patient c/o sore throat and chills that started this morning. Patient reports bodyaches that started yesterday.

## 2017-07-25 NOTE — ED Provider Notes (Signed)
[facility]    CSN: [id number] Arrival date & time: 07/25/17  6213     History   Chief Complaint Chief Complaint  Patient presents with  . Sore Throat    HPI Sherri Matthews is a 52 y.o. female.   The history is provided by the patient.  Sore Throat  Associated symptoms include headaches.  URI  Presenting symptoms: fatigue and sore throat   Severity:  Moderate Onset quality:  Sudden Duration:  2 days Timing:  Constant Progression:  Worsening Chronicity:  New Relieved by:  OTC medications Associated symptoms: headaches and myalgias   Associated symptoms: no wheezing   Risk factors: sick contacts   Risk factors: not elderly, no chronic cardiac disease, no chronic kidney disease, no chronic respiratory disease, no diabetes mellitus, no immunosuppression, no recent illness and no recent travel     Past Medical History:  Diagnosis Date  . Anxiety   . AV node dysfunction    tachycardia sees Dr. Lady Gary  . Cervical disc disease    degenerative disk   . Chronic abdominal pain   . Heart murmur   . Hyperlipidemia   . Hypertension   . IBS (irritable bowel syndrome)   . Mitral valve prolapse   . Palpitations   . Sleep apnea    possible but not tested    Patient Active Problem List   Diagnosis Date Noted  . Chest pain 06/19/2016  . Abnormal EKG 06/19/2016  . Menopause 01/28/2016  . Essential hypertension 05/15/2015  . Hyperlipidemia 05/15/2015  . Gastritis   . 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 Heart murmur 07/16/2014  . Gastroesophageal reflux disease 07/16/2014  . H/O: osteoarthritis 07/16/2014    Past Surgical History:  Procedure Laterality Date  . APPENDECTOMY  2008  . CARDIAC CATHETERIZATION N/A 09/12/2015   Procedure: Left Heart Cath and Coronary Angiography;  Surgeon: Marcina Millard, MD;  Location: [facility];  Service: Cardiovascular;  Laterality: N/A;  . ESOPHAGOGASTRODUODENOSCOPY (EGD) WITH PROPOFOL N/A 04/18/2015   Procedure: ESOPHAGOGASTRODUODENOSCOPY (EGD) WITH PROPOFOL;  Surgeon: Midge Minium, MD;  Location[facility];  Service: Endoscopy;  Laterality: N/A;  . HERNIA REPAIR      OB History   None      Home Medications    Prior to Admission medications   Medication Sig Start Date End Date Taking? Authorizing Provider  ALPRAZolam Prudy Feeler) 0.5 MG tablet TAKE ONE TABLET BY MOUTH ONCE DAILY AS NEEDED 11/25/16  Yes Duanne Limerick, MD  Cholecalciferol (VITAMIN D-3) 1000 units CAPS Take 1 capsule by mouth daily.   Yes [provider]  Cyanocobalamin (B-12) 1000 MCG CAPS Take by mouth.   Yes [provider]  cyclobenzaprine (FLEXERIL) 5 MG tablet Take 1 tablet (5 mg total) by mouth at bedtime. 11/09/16  Yes Payton Mccallum, MD  ferrous sulfate 325 (65 FE) MG tablet Take 325 mg by mouth daily with breakfast.   Yes [provider]  hydrochlorothiazide (HYDRODIURIL) 12.5 MG tablet Take 1 tablet (12.5 mg total) by mouth daily. 11/25/16  Yes Duanne Limerick, MD  magnesium oxide (MAG-OX) 400 MG tablet Take 400 mg by mouth daily.   Yes [provider]  metoprolol tartrate (LOPRESSOR) 25 MG tablet Take 0.5 tablets (12.5 mg total) by mouth 2 (two) times daily. 11/25/16  Yes Duanne Limerick, MD  Multiple Vitamins-Minerals (ONE-A-DAY WOMENS  50 PLUS PO) Take 1 tablet by mouth daily.   Yes [provider]  omeprazole (PRILOSEC) 40 MG capsule TAKE 1 CAPSULE BY MOUTH TWICE DAILY 06/09/17  Yes Duanne LimerickJones, Deanna C, MD  simvastatin (ZOCOR) 40 MG tablet TAKE 1 TABLET BY MOUTH ONCE DAILY 06/09/17  Yes Duanne LimerickJones, Deanna C, MD  acetaminophen (TYLENOL) 500 MG tablet Take 500 mg by mouth every 6 (six) hours as needed.    [provider]  ibuprofen (ADVIL,MOTRIN) 200 MG tablet Take 200 mg by mouth every 6 (six) hours as needed.     [provider]  lidocaine (XYLOCAINE) 2 % solution 20 ml gargle and spit q 6 hours prn 07/25/17   Payton Mccallumonty, Arelis Neumeier, MD    Family History Family History  Problem Relation Age of Onset  . Atrial fibrillation Mother   . Hypertension Mother   . Diabetes Mother   . Atrial fibrillation Father   . Breast cancer Paternal Aunt        pat great aunt    Social History Social History   Tobacco Use  . Smoking status: Never Smoker  . Smokeless tobacco: Never Used  Substance Use Topics  . Alcohol use: No    Alcohol/week: 0.0 oz  . Drug use: No     Allergies   Oxycodone-acetaminophen; Oxycodone-acetaminophen; Ciprofloxacin; Dilaudid [hydromorphone hcl]; Fenofibrate; Sertraline; and Vicodin [hydrocodone-acetaminophen]   Review of Systems Review of Systems  Constitutional: Positive for fatigue.  HENT: Positive for sore throat.   Respiratory: Negative for wheezing.   Musculoskeletal: Positive for myalgias.  Neurological: Positive for headaches.     Physical Exam Triage Vital Signs ED Triage Vitals  Enc Vitals Group     BP 07/25/17 0844 126/82     Pulse Rate 07/25/17 0844 62     Resp 07/25/17 0844 16     Temp 07/25/17 0844 98.9 F (37.2 C)     Temp Source 07/25/17 0844 Oral     SpO2 07/25/17 0844 99 %     Weight 07/25/17 0842 210 lb (95.3 kg)     Height 07/25/17 0842 5\' 11"  (1.803 m)     Head Circumference --      Peak Flow --      Pain Score 07/25/17 0842 4     Pain Loc --      Pain Edu? --      Excl. in GC? --    No data found.  Updated Vital Signs BP 126/82 (BP Location: Left Arm)   Pulse 62   Temp 98.9 F (37.2 C) (Oral)   Resp 16   Ht 5\' 11"  (1.803 m)   Wt 210 lb (95.3 kg)   SpO2 99%   BMI 29.29 kg/m   Visual Acuity Right Eye Distance:   Left Eye Distance:   Bilateral Distance:    Right Eye Near:   Left Eye Near:    Bilateral Near:     Physical Exam  Constitutional: She appears well-developed and well-nourished. No distress.  HENT:    Head: Normocephalic and atraumatic.  Right Ear: Tympanic membrane, external ear and ear canal normal.  Left Ear: Tympanic membrane, external ear and ear canal normal.  Nose: No mucosal edema, rhinorrhea, nose lacerations, sinus tenderness, nasal deformity, septal deviation or nasal septal hematoma. No epistaxis.  No foreign bodies. Right sinus exhibits no maxillary sinus tenderness and no frontal sinus tenderness. Left sinus exhibits no maxillary sinus tenderness and no frontal sinus tenderness.  Mouth/Throat: Uvula is midline and mucous  membranes are normal. Posterior oropharyngeal erythema present. No oropharyngeal exudate. Tonsillar exudate.  Eyes: Pupils are equal, round, and reactive to light. Conjunctivae and EOM are normal. Right eye exhibits no discharge. Left eye exhibits no discharge. No scleral icterus.  Neck: Normal range of motion. Neck supple. No thyromegaly present.  Cardiovascular: Normal rate, regular rhythm and normal heart sounds.  Pulmonary/Chest: Effort normal and breath sounds normal. No stridor. No respiratory distress. She has no wheezes. She has no rales.  Lymphadenopathy:    She has no cervical adenopathy.  Skin: She is not diaphoretic.  Nursing note and vitals reviewed.    UC Treatments / Results  Labs (all labs ordered are listed, but only abnormal results are displayed) Labs Reviewed  RAPID STREP SCREEN ([facility] ONLY)  CULTURE, GROUP A STREP [facility])    EKG None  Radiology No results found.  Procedures Procedures (including critical care time)  Medications Ordered in UC Medications - No data to display  Initial Impression / Assessment and Plan / UC Course  I have reviewed the triage vital signs and the nursing notes.  Pertinent labs & imaging results that were available during my care of the patient were reviewed by me and considered in my medical decision making (see chart for details).     Final Clinical Impressions(s) / UC Diagnoses    Final diagnoses:  Viral pharyngitis  Viral syndrome    ED Prescriptions    Medication Sig Dispense Auth. Provider   lidocaine (XYLOCAINE) 2 % solution 20 ml gargle and spit q 6 hours prn 100 mL Payton Mccallum, MD     1. Lab result and diagnosis reviewed with patient 2. rx as per orders above; reviewed possible side effects, interactions, risks and benefits  3. Recommend supportive treatment with otc analgesics prn, rest, fluids 4. Follow-up prn if symptoms worsen or don't improve   Controlled Substance Prescriptions [place] Controlled Substance Registry consulted? Not Applicable   Payton Mccallum, MD 07/25/17 1003

## 2017-07-27 LAB — CULTURE, GROUP A STREP (THRC)

## 2017-07-29 ENCOUNTER — Other Ambulatory Visit
Admission: RE | Admit: 2017-07-29 | Discharge: 2017-07-29 | Disposition: A | Discharge status: Home / Self Care | Payer: BC Managed Care – PPO | Source: Ambulatory Visit | Attending: Otolaryngology | Admitting: Otolaryngology

## 2017-07-29 DIAGNOSIS — L040 Acute lymphadenitis of face, head and neck: Secondary | ICD-10-CM | POA: Diagnosis present

## 2017-07-30 LAB — EPSTEIN-BARR VIRUS VCA ANTIBODY PANEL
EBV NA IgG: <18 U/mL (ref 0.0–17.9)
EBV VCA IgG: 123 U/mL — ABNORMAL HIGH (ref 0.0–17.9)

## 2017-08-19 ENCOUNTER — Other Ambulatory Visit: Payer: Self-pay

## 2017-08-19 ENCOUNTER — Encounter: Payer: Self-pay | Admitting: Emergency Medicine

## 2017-08-19 ENCOUNTER — Ambulatory Visit (INDEPENDENT_AMBULATORY_CARE_PROVIDER_SITE_OTHER): Payer: BC Managed Care – PPO

## 2017-08-19 ENCOUNTER — Ambulatory Visit
Admission: EM | Admit: 2017-08-19 | Discharge: 2017-08-19 | Disposition: A | Discharge status: Home / Self Care | Payer: BC Managed Care – PPO | Attending: Family Medicine | Admitting: Family Medicine

## 2017-08-19 DIAGNOSIS — S99922A Unspecified injury of left foot, initial encounter: Secondary | ICD-10-CM

## 2017-08-19 MED ORDER — MELOXICAM 15 MG PO TABS: 15.0000 mg | ORAL_TABLET | Freq: Every day | ORAL | 0 refills | Status: DC | PRN

## 2017-08-19 NOTE — Discharge Instructions (Signed)
Ice, rest.  Medication as needed.  Take care  Dr. Jaeveon Ashland  

## 2017-08-19 NOTE — ED Provider Notes (Signed)
[facility]    CSN: [id number] Arrival date & time: 08/19/17  1423  History   Chief Complaint Chief Complaint  Patient presents with  . Foot Pain    left   HPI  52 year old female presents with left foot pain.  Left foot pain  Patient reports that her pain started 2 weeks ago after her foot was stepped on.  Patient states that she has had ongoing pain on the dorsum of her left foot since that time.  Patient states that she has had no improvement since that time.  Patient is concerned that it may be fractured given her persistent pain.  Moderate in severity.  Worse with pressure.  No relieving factors.  No other associated symptoms.  No other complaints or concerns at this time.  Past Medical History:  Diagnosis Date  . Anxiety   . AV node dysfunction    tachycardia sees Dr. Lady Gary  . Cervical disc disease    degenerative disk   . Chronic abdominal pain   . Heart murmur   . Hyperlipidemia   . Hypertension   . IBS (irritable bowel syndrome)   . Mitral valve prolapse   . Palpitations   . Sleep apnea    possible but not tested    Patient Active Problem List   Diagnosis Date Noted  . Chest pain 06/19/2016  . Abnormal EKG 06/19/2016  . Menopause 01/28/2016  . Essential hypertension 05/15/2015  . Hyperlipidemia 05/15/2015  . Gastritis   . 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 Heart murmur 07/16/2014  . Gastroesophageal reflux disease 07/16/2014  . H/O: osteoarthritis 07/16/2014    Past Surgical History:  Procedure Laterality Date  . APPENDECTOMY  2008  . CARDIAC CATHETERIZATION N/A 09/12/2015   Procedure: Left Heart Cath and Coronary Angiography;  Surgeon: Marcina Millard, MD;  Location: [facility];  Service: Cardiovascular;  Laterality: N/A;  . ESOPHAGOGASTRODUODENOSCOPY (EGD)  WITH PROPOFOL N/A 04/18/2015   Procedure: ESOPHAGOGASTRODUODENOSCOPY (EGD) WITH PROPOFOL;  Surgeon: Midge Minium, MD;  Location: [facility];  Service: Endoscopy;  Laterality: N/A;  . HERNIA REPAIR      OB History   None      Home Medications    Prior to Admission medications   Medication Sig Start Date End Date Taking? Authorizing Provider  acetaminophen (TYLENOL) 500 MG tablet Take 500 mg by mouth every 6 (six) hours as needed.    [provider]  ALPRAZolam Prudy Feeler) 0.5 MG tablet TAKE ONE TABLET BY MOUTH ONCE DAILY AS NEEDED 11/25/16   Duanne Limerick, MD  Cholecalciferol (VITAMIN D-3) 1000 units CAPS Take 1 capsule by mouth daily.    [provider]  Cyanocobalamin (B-12) 1000 MCG CAPS Take by mouth.    [provider]  cyclobenzaprine (FLEXERIL) 5 MG tablet Take 1 tablet (5 mg total) by mouth at bedtime. 11/09/16   Payton Mccallum, MD  ferrous sulfate 325 (65 FE) MG tablet Take 325 mg by mouth daily with breakfast.    [provider]  hydrochlorothiazide (HYDRODIURIL) 12.5 MG tablet Take 1 tablet (12.5 mg total) by mouth daily. 11/25/16   Duanne Limerick, MD  lidocaine (XYLOCAINE) 2 % solution 20 ml gargle and spit q 6 hours prn 07/25/17   Payton Mccallum, MD  magnesium oxide (MAG-OX) 400 MG tablet Take 400 mg by mouth daily.    [provider]  meloxicam (MOBIC) 15 MG tablet Take 1 tablet (15 mg total) by mouth daily as needed. 08/19/17   Tommie Samsook, Synda Bagent G, DO  metoprolol tartrate (LOPRESSOR) 25 MG tablet Take 0.5 tablets (12.5 mg total) by mouth 2 (two) times daily. 11/25/16   Duanne LimerickJones, Deanna C, MD  Multiple Vitamins-Minerals (ONE-A-DAY WOMENS 50 PLUS PO) Take 1 tablet by mouth daily.    [provider]  omeprazole (PRILOSEC) 40 MG capsule TAKE 1 CAPSULE BY MOUTH TWICE DAILY 06/09/17   Duanne LimerickJones, Deanna C, MD  simvastatin (ZOCOR) 40 MG tablet TAKE 1 TABLET BY MOUTH ONCE DAILY 06/09/17   Duanne LimerickJones, Deanna C, MD    Family History Family History    Problem Relation Age of Onset  . Atrial fibrillation Mother   . Hypertension Mother   . Diabetes Mother   . Atrial fibrillation Father   . Breast cancer Paternal Aunt        pat great aunt    Social History Social History   Tobacco Use  . Smoking status: Never Smoker  . Smokeless tobacco: Never Used  Substance Use Topics  . Alcohol use: No    Alcohol/week: 0.0 oz  . Drug use: No     Allergies   Oxycodone-acetaminophen; Oxycodone-acetaminophen; Ciprofloxacin; Dilaudid [hydromorphone hcl]; Fenofibrate; Sertraline; and Vicodin [hydrocodone-acetaminophen]   Review of Systems Review of Systems  Constitutional: Negative.   Musculoskeletal:       Left foot pain.   Physical Exam Triage Vital Signs ED Triage Vitals  Enc Vitals Group     BP 08/19/17 1445 120/82     Pulse Rate 08/19/17 1445 72     Resp 08/19/17 1445 16     Temp 08/19/17 1445 98.2 F (36.8 C)     Temp Source 08/19/17 1445 Oral     SpO2 08/19/17 1445 100 %     Weight 08/19/17 1443 210 lb (95.3 kg)     Height 08/19/17 1443 5\' 11"  (1.803 m)     Head Circumference --      Peak Flow --      Pain Score 08/19/17 1443 6     Pain Loc --      Pain Edu? --      Excl. in GC? --    Updated Vital Signs BP 120/82 (BP Location: Left Arm)   Pulse 72   Temp 98.2 F (36.8 C) (Oral)   Resp 16   Ht 5\' 11"  (1.803 m)   Wt 210 lb (95.3 kg)   SpO2 100%   BMI 29.29 kg/m   Physical Exam  Constitutional: She is oriented to person, place, and time. She appears well-developed. No distress.  Pulmonary/Chest: Effort normal. No respiratory distress.  Musculoskeletal:  Left foot -no apparent swelling.  Patient with mild tenderness at the base of the fourth metatarsal.  No bruising noted.  No tenderness or swelling of the left ankle.  Neurological: She is alert and oriented to person, place, and time.  Skin: Skin is warm. No rash noted.  Psychiatric: She has a normal mood and affect. Her behavior is normal.  Nursing note  and vitals reviewed.  UC Treatments / Results  Labs (all labs ordered are listed, but only abnormal results are displayed) Labs Reviewed - No data to display  EKG None  Radiology Dg Foot Complete Left  Result Date: 08/19/2017 CLINICAL DATA:  Someone stepped on foot 1 week ago with persistent pain, initial encounter EXAM: LEFT FOOT - COMPLETE 3+ VIEW COMPARISON:  None. FINDINGS: Degenerative  changes of first MTP joint are noted. No acute fracture dislocation is seen. Small calcaneal spur is noted inferiorly. Os naviculare is noted as well. IMPRESSION: Degenerative change without acute abnormality. Electronically Signed   By: Alcide Clever M.D.   On: 08/19/2017 15:15    Procedures Procedures (including critical care time)  Medications Ordered in UC Medications - No data to display  Initial Impression / Assessment and Plan / UC Course  I have reviewed the triage vital signs and the nursing notes.  Pertinent labs & imaging results that were available during my care of the patient were reviewed by me and considered in my medical decision making (see chart for details).    52 year old female presents with an injury to her left foot.  X-ray with no acute findings.  Advised ice, rest.  Meloxicam as directed.  Patient has follow-up already scheduled with her podiatrist.  Final Clinical Impressions(s) / UC Diagnoses   Final diagnoses:  Injury of left foot, initial encounter     Discharge Instructions     Ice, rest.  Medication as needed.  Take care  Dr. Adriana Simas     ED Prescriptions    Medication Sig Dispense Auth. Provider   meloxicam (MOBIC) 15 MG tablet Take 1 tablet (15 mg total) by mouth daily as needed. 30 tablet Tommie Sams, DO     Controlled Substance Prescriptions [place] Controlled Substance Registry consulted? Not Applicable   Tommie Sams, DO 08/19/17 1537

## 2017-08-19 NOTE — ED Triage Notes (Signed)
Patient c/o left foot pain that started a week ago.  Patient states that someone stepped on top of her left foot a week ago.

## 2017-09-05 ENCOUNTER — Other Ambulatory Visit: Payer: Self-pay | Admitting: Family Medicine

## 2017-09-05 DIAGNOSIS — I10 Essential (primary) hypertension: Secondary | ICD-10-CM

## 2017-09-05 DIAGNOSIS — K219 Gastro-esophageal reflux disease without esophagitis: Secondary | ICD-10-CM

## 2017-09-17 ENCOUNTER — Ambulatory Visit
Admission: EM | Admit: 2017-09-17 | Discharge: 2017-09-17 | Disposition: A | Discharge status: Home / Self Care | Payer: BC Managed Care – PPO | Attending: Family Medicine | Admitting: Family Medicine

## 2017-09-17 ENCOUNTER — Encounter: Payer: Self-pay | Admitting: Gynecology

## 2017-09-17 DIAGNOSIS — R1030 Lower abdominal pain, unspecified: Secondary | ICD-10-CM

## 2017-09-17 DIAGNOSIS — R300 Dysuria: Secondary | ICD-10-CM | POA: Diagnosis not present

## 2017-09-17 LAB — URINALYSIS, COMPLETE (UACMP) WITH MICROSCOPIC

## 2017-09-17 MED ORDER — FLUCONAZOLE 150 MG PO TABS: 150.0000 mg | ORAL_TABLET | Freq: Once | ORAL | 0 refills | Status: AC

## 2017-09-17 MED ORDER — NITROFURANTOIN MONOHYD MACRO 100 MG PO CAPS: 100.0000 mg | ORAL_CAPSULE | Freq: Two times a day (BID) | ORAL | 0 refills | Status: DC

## 2017-09-17 NOTE — Discharge Instructions (Addendum)
Take medication as prescribed. Rest. Drink plenty of fluids.  ° °Follow up with your primary care physician this week as needed. Return to Urgent care for new or worsening concerns.  ° °

## 2017-09-17 NOTE — ED Provider Notes (Signed)
[facility] ____________________________________________  Time seen: Approximately 9:23 AM  I have reviewed the triage vital signs and the nursing notes.   HISTORY  Chief Complaint Urinary Tract Infection  HPI Sherri Matthews is a 52 y.o. female presenting for evaluation of urinary discomfort present since last night.  States that she began feeling some pressure suprapubically which was then followed by some burning with urination.  States that she did also feel like she is urinating more frequently and having some hesitancy.  Denies other abdominal pain, back pain, fevers.  Denies known trigger, except reports she has been under stress a lot recently.  States feels similar to previous UTIs.  Denies any vaginal discomfort, vaginal discharge or vaginal irritation.  most recent antibiotic use was in June for short period prior to finding out that she had mono instead of strep.  Denies other recent antibiotic use.  Did take Azo last night which helped some with symptoms.  Denies other aggravating or alleviating factors.  Reports otherwise feels well.  Duanne Limerick, MD: PCP No LMP recorded. Patient is perimenopausal.  Past Medical History:  Diagnosis Date  . Anxiety   . AV node dysfunction    tachycardia sees Dr. Lady Gary  . Cervical disc disease    degenerative disk   . Chronic abdominal pain   . Heart murmur   . Hyperlipidemia   . Hypertension   . IBS (irritable bowel syndrome)   . Mitral valve prolapse   . Palpitations   . Sleep apnea    possible but not tested    Patient Active Problem List   Diagnosis Date Noted  . Chest pain 06/19/2016  . Abnormal EKG 06/19/2016  . Menopause 01/28/2016  . Essential hypertension 05/15/2015  . Hyperlipidemia 05/15/2015  . Gastritis   . 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 DDD (degenerative disc disease), lumbosacral 07/16/2014    . Heart murmur 07/16/2014  . Gastroesophageal reflux disease 07/16/2014  . H/O: osteoarthritis 07/16/2014    Past Surgical History:  Procedure Laterality Date  . APPENDECTOMY  2008  . CARDIAC CATHETERIZATION N/A 09/12/2015   Procedure: Left Heart Cath and Coronary Angiography;  Surgeon: Marcina Millard, MD;  Location: [facility];  Service: Cardiovascular;  Laterality: N/A;  . ESOPHAGOGASTRODUODENOSCOPY (EGD) WITH PROPOFOL N/A 04/18/2015   Procedure: ESOPHAGOGASTRODUODENOSCOPY (EGD) WITH PROPOFOL;  Surgeon: Midge Minium, MD;  Location: [facility];  Service: Endoscopy;  Laterality: N/A;  . HERNIA REPAIR       No current facility-administered medications for this encounter.   Current Outpatient Medications:  .  acetaminophen (TYLENOL) 500 MG tablet, Take 500 mg by mouth every 6 (six) hours as needed., Disp: , Rfl:  .  ALPRAZolam (XANAX) 0.5 MG tablet, TAKE ONE TABLET BY MOUTH ONCE DAILY AS NEEDED, Disp: 30 tablet, Rfl: 2 .  Cholecalciferol (VITAMIN D-3) 1000 units CAPS, Take 1 capsule by mouth daily., Disp: , Rfl:  .  Cyanocobalamin (B-12) 1000 MCG CAPS, Take by mouth., Disp: , Rfl:  .  ferrous sulfate 325 (65 FE) MG tablet, Take 325 mg by mouth daily with breakfast., Disp: , Rfl:  .  hydrochlorothiazide (HYDRODIURIL) 12.5 MG tablet, Take 1 tablet (12.5 mg total) by mouth daily., Disp: 90 tablet, Rfl: 1 .  magnesium oxide (MAG-OX) 400 MG tablet, Take 400 mg by mouth daily., Disp: , Rfl:  .  metoprolol tartrate (LOPRESSOR) 25 MG tablet, Take 0.5 tablets (12.5 mg  total) by mouth 2 (two) times daily., Disp: 180 tablet, Rfl: 1 .  Multiple Vitamins-Minerals (ONE-A-DAY WOMENS 50 PLUS PO), Take 1 tablet by mouth daily., Disp: , Rfl:  .  omeprazole (PRILOSEC) 40 MG capsule, TAKE 1 CAPSULE BY MOUTH TWICE DAILY, Disp: 90 capsule, Rfl: 0 .  simvastatin (ZOCOR) 40 MG tablet, TAKE 1 TABLET BY MOUTH ONCE DAILY, Disp: 90 tablet, Rfl: 0 .  fluconazole (DIFLUCAN) 150 MG tablet, Take 1  tablet (150 mg total) by mouth once for 1 dose. Take one pill orally as needed, Disp: 1 tablet, Rfl: 0 .  nitrofurantoin, macrocrystal-monohydrate, (MACROBID) 100 MG capsule, Take 1 capsule (100 mg total) by mouth 2 (two) times daily., Disp: 10 capsule, Rfl: 0  Allergies Oxycodone-acetaminophen; Oxycodone-acetaminophen; Ciprofloxacin; Dilaudid [hydromorphone hcl]; Fenofibrate; Sertraline; and Vicodin [hydrocodone-acetaminophen]  Family History  Problem Relation Age of Onset  . Atrial fibrillation Mother   . Hypertension Mother   . Diabetes Mother   . Atrial fibrillation Father   . Breast cancer Paternal Aunt        pat great aunt    Social History Social History   Tobacco Use  . Smoking status: Never Smoker  . Smokeless tobacco: Never Used  Substance Use Topics  . Alcohol use: No    Alcohol/week: 0.0 oz  . Drug use: No    Review of Systems Constitutional: No fever/chills ENT: No sore throat. Cardiovascular: Denies chest pain. Respiratory: Denies shortness of breath. Gastrointestinal: No abdominal pain.  No nausea, no vomiting.  No diarrhea.   Genitourinary: positive for dysuria. Musculoskeletal: Negative for back pain. Skin: Negative for rash.  ____________________________________________   PHYSICAL EXAM:  VITAL SIGNS: ED Triage Vitals  Enc Vitals Group     BP 09/17/17 0817 (!) 122/94     Pulse Rate 09/17/17 0817 63     Resp 09/17/17 0817 16     Temp --      Temp Source 09/17/17 0817 Oral     SpO2 09/17/17 0817 100 %     Weight 09/17/17 0819 210 lb (95.3 kg)     Height --      Head Circumference --      Peak Flow --      Pain Score 09/17/17 0818 2     Pain Loc --      Pain Edu? --      Excl. in GC? --     Constitutional: Alert and oriented. Well appearing and in no acute distress. ENT      Head: Normocephalic and atraumatic. Cardiovascular: Normal rate, regular rhythm. Grossly normal heart sounds.  Good peripheral circulation. Respiratory: Normal  respiratory effort without tachypnea nor retractions. Breath sounds are clear and equal bilaterally. No wheezes, rales, rhonchi. Gastrointestinal: Soft and nontender.  No CVA tenderness. No hepatosplenomegaly palpated.  Musculoskeletal: No midline cervical, thoracic or lumbar tenderness to palpation.  Neurologic:  Normal speech and language. Speech is normal. No gait instability.  Skin:  Skin is warm, dry and intact. No rash noted. Psychiatric: Mood and affect are normal. Speech and behavior are normal. Patient exhibits appropriate insight and judgment   ___________________________________________   LABS (all labs ordered are listed, but only abnormal results are displayed)  Labs Reviewed  URINALYSIS, COMPLETE (UACMP) WITH MICROSCOPIC - Abnormal; Notable for the following components:      Result Value   Color, Urine ORANGE (*)    APPearance HAZY (*)    Glucose, UA   (*)    Value: TEST NOT REPORTED  DUE TO COLOR INTERFERENCE OF URINE PIGMENT   Hgb urine dipstick   (*)    Value: TEST NOT REPORTED DUE TO COLOR INTERFERENCE OF URINE PIGMENT   Bilirubin Urine   (*)    Value: TEST NOT REPORTED DUE TO COLOR INTERFERENCE OF URINE PIGMENT   Ketones, ur   (*)    Value: TEST NOT REPORTED DUE TO COLOR INTERFERENCE OF URINE PIGMENT   Protein, ur   (*)    Value: TEST NOT REPORTED DUE TO COLOR INTERFERENCE OF URINE PIGMENT   Nitrite   (*)    Value: TEST NOT REPORTED DUE TO COLOR INTERFERENCE OF URINE PIGMENT   Leukocytes, UA   (*)    Value: TEST NOT REPORTED DUE TO COLOR INTERFERENCE OF URINE PIGMENT   Bacteria, UA RARE (*)    All other components within normal limits  URINE CULTURE   ____________________________________________  PROCEDURES Procedures   INITIAL IMPRESSION / ASSESSMENT AND PLAN / ED COURSE  Pertinent labs & imaging results that were available during my care of the patient were reviewed by me and considered in my medical decision making (see chart for details).  Well  appearing patient.  No acute distress.  Urinalysis reviewed, discussed in detail with patient, not clear UTI.  Patient denies any vaginal complaints.  Discussed will culture urine and empirically start on oral Macrobid.  Single Diflucan given as needed.  Encourage rest, fluids, supportive care and discuss reevaluation for continued complaints.Discussed indication, risks and benefits of medications with patient.  Discussed follow up with Primary care physician this week. Discussed follow up and return parameters including no resolution or any worsening concerns. Patient verbalized understanding and agreed to plan.   ____________________________________________   FINAL CLINICAL IMPRESSION(S) / ED DIAGNOSES  Final diagnoses:  Dysuria     ED Discharge Orders        Ordered    nitrofurantoin, macrocrystal-monohydrate, (MACROBID) 100 MG capsule  2 times daily     09/17/17 0858    fluconazole (DIFLUCAN) 150 MG tablet   Once     09/17/17 [id number]       Note: This dictation was prepared with Dragon dictation along with smaller phrase technology. Any transcriptional errors that result from this process are unintentional.         Renford DillsMiller, Hazelle Woollard, NP 09/17/17 [phone]

## 2017-09-17 NOTE — ED Triage Notes (Signed)
Patient c/o urgency to urination x last pm.

## 2017-09-19 LAB — URINE CULTURE

## 2017-11-01 ENCOUNTER — Encounter: Payer: Self-pay | Admitting: Family Medicine

## 2017-11-01 ENCOUNTER — Ambulatory Visit: Payer: BC Managed Care – PPO | Admitting: Family Medicine

## 2017-11-01 VITALS — BP 120/80 | HR 80 | Ht 71.0 in | Wt 212.0 lb

## 2017-11-01 DIAGNOSIS — R5383 Other fatigue: Secondary | ICD-10-CM

## 2017-11-01 DIAGNOSIS — R2242 Localized swelling, mass and lump, left lower limb: Secondary | ICD-10-CM

## 2017-11-01 DIAGNOSIS — M25862 Other specified joint disorders, left knee: Secondary | ICD-10-CM

## 2017-11-01 DIAGNOSIS — Z8639 Personal history of other endocrine, nutritional and metabolic disease: Secondary | ICD-10-CM

## 2017-11-01 DIAGNOSIS — R69 Illness, unspecified: Secondary | ICD-10-CM

## 2017-11-01 DIAGNOSIS — Z23 Encounter for immunization: Secondary | ICD-10-CM | POA: Diagnosis not present

## 2017-11-01 DIAGNOSIS — R221 Localized swelling, mass and lump, neck: Secondary | ICD-10-CM | POA: Diagnosis not present

## 2017-11-01 DIAGNOSIS — Z1159 Encounter for screening for other viral diseases: Secondary | ICD-10-CM

## 2017-11-01 DIAGNOSIS — Z114 Encounter for screening for human immunodeficiency virus [HIV]: Secondary | ICD-10-CM

## 2017-11-01 NOTE — Progress Notes (Signed)
Name: Sherri Matthews   MRN: [id number]    DOB: 10/31/1965   Date:11/01/2017       Progress Note  Subjective  Chief Complaint  Chief Complaint  Patient presents with  . Fatigue    wants B12 and vit D checked  . Adenopathy    "feels like glands are swollen in my neck"  . Mass    On inside of L) leg/ knee- feels hard, but doesn't hurt    Thyroid Problem  Presents for follow-up (for fatigue) visit. Symptoms include anxiety, constipation, diarrhea, fatigue, leg swelling, menstrual problem and visual change. Patient reports no cold intolerance, depressed mood, diaphoresis, dry skin, hair loss, heat intolerance, hoarse voice, nail problem, palpitations or weight loss. The symptoms have been stable.  Neck Pain   This is a new problem. The current episode started more than 1 month ago. The problem occurs intermittently. The pain is associated with nothing. The pain is present in the anterior neck. The pain is at a severity of 3/10. The pain is mild. Nothing aggravates the symptoms. Associated symptoms include a visual change. Pertinent negatives include no chest pain, fever, headaches, leg pain, numbness, pain with swallowing, paresis, photophobia, syncope, tingling, trouble swallowing, weakness or weight loss. She has tried NSAIDs for the symptoms. The treatment provided mild relief.  Leg Pain   The incident occurred 3 to 5 days ago. There was no injury mechanism. The pain is present in the left knee. Pertinent negatives include no inability to bear weight, loss of motion, loss of sensation, muscle weakness, numbness or tingling. Nothing aggravates the symptoms. She has tried NSAIDs for the symptoms.    No problem-specific Assessment & Plan notes found for this encounter.   Past Medical History:  Diagnosis Date  . Anxiety   . AV node dysfunction    tachycardia sees Dr. Lady Gary  . Cervical disc disease    degenerative disk   . Chronic abdominal pain   . Heart murmur   . Hyperlipidemia   .  Hypertension   . IBS (irritable bowel syndrome)   . Mitral valve prolapse   . Palpitations   . Sleep apnea    possible but not tested    Past Surgical History:  Procedure Laterality Date  . APPENDECTOMY  2008  . CARDIAC CATHETERIZATION N/A 09/12/2015   Procedure: Left Heart Cath and Coronary Angiography;  Surgeon: Marcina Millard, MD;  Location: [facility];  Service: Cardiovascular;  Laterality: N/A;  . ESOPHAGOGASTRODUODENOSCOPY (EGD) WITH PROPOFOL N/A 04/18/2015   Procedure: ESOPHAGOGASTRODUODENOSCOPY (EGD) WITH PROPOFOL;  Surgeon: Midge Minium, MD;  Location: [facility];  Service: Endoscopy;  Laterality: N/A;  . HERNIA REPAIR      Family History  Problem Relation Age of Onset  . Atrial fibrillation Mother   . Hypertension Mother   . Diabetes Mother   . Atrial fibrillation Father   . Breast cancer Paternal Aunt        pat great aunt    Social History   Socioeconomic History  . Marital status: Married    Spouse name: Not on file  . Number of children: Not on file  . Years of education: Not on file  . Highest education level: Not on file  Occupational History  . Not on file  Social Needs  . Financial resource strain: Not on file  . Food insecurity:    Worry: Not on file    Inability: Not on file  . Transportation needs:  Medical: Not on file    Non-medical: Not on file  Tobacco Use  . Smoking status: Never Smoker  . Smokeless tobacco: Never Used  Substance and Sexual Activity  . Alcohol use: No    Alcohol/week: 0.0 standard drinks  . Drug use: No  . Sexual activity: Not on file  Lifestyle  . Physical activity:    Days per week: Not on file    Minutes per session: Not on file  . Stress: Not on file  Relationships  . Social connections:    Talks on phone: Not on file    Gets together: Not on file    Attends religious service: Not on file    Active member of club or organization: Not on file    Attends meetings of clubs or  organizations: Not on file    Relationship status: Not on file  . Intimate partner violence:    Fear of current or ex partner: Not on file    Emotionally abused: Not on file    Physically abused: Not on file    Forced sexual activity: Not on file  Other Topics Concern  . Not on file  Social History Narrative   Lives at home and independent at baseline    Allergies  Allergen Reactions  . Oxycodone-Acetaminophen Itching  . Oxycodone-Acetaminophen Itching  . Ciprofloxacin Nausea And Vomiting  . Dilaudid [Hydromorphone Hcl]   . Fenofibrate     myalgia  . Sertraline Other (See Comments)    Pt prefers to never be on this again.    . Vicodin [Hydrocodone-Acetaminophen] Other (See Comments)    Heart raced.    Outpatient Medications Prior to Visit  Medication Sig Dispense Refill  . ALPRAZolam (XANAX) 0.5 MG tablet TAKE ONE TABLET BY MOUTH ONCE DAILY AS NEEDED 30 tablet 2  . Cholecalciferol (VITAMIN D-3) 1000 units CAPS Take 1 capsule by mouth daily.    . Cyanocobalamin (B-12) 1000 MCG CAPS Take by mouth.    . ferrous sulfate 325 (65 FE) MG tablet Take 325 mg by mouth daily with breakfast.    . hydrochlorothiazide (HYDRODIURIL) 12.5 MG tablet Take 1 tablet (12.5 mg total) by mouth daily. 90 tablet 1  . ibuprofen (ADVIL,MOTRIN) 200 MG tablet Take 200 mg by mouth every 6 (six) hours as needed.    . magnesium oxide (MAG-OX) 400 MG tablet Take 400 mg by mouth daily.    . metoprolol tartrate (LOPRESSOR) 25 MG tablet Take 0.5 tablets (12.5 mg total) by mouth 2 (two) times daily. 180 tablet 1  . Multiple Vitamins-Minerals (ONE-A-DAY WOMENS 50 PLUS PO) Take 1 tablet by mouth daily.    Marland Kitchen omeprazole (PRILOSEC) 40 MG capsule TAKE 1 CAPSULE BY MOUTH TWICE DAILY (Patient taking differently: Take 40 mg by mouth daily. ) 90 capsule 0  . simvastatin (ZOCOR) 40 MG tablet TAKE 1 TABLET BY MOUTH ONCE DAILY 90 tablet 0  . acetaminophen (TYLENOL) 500 MG tablet Take 500 mg by mouth every 6 (six) hours as  needed.    . nitrofurantoin, macrocrystal-monohydrate, (MACROBID) 100 MG capsule Take 1 capsule (100 mg total) by mouth 2 (two) times daily. 10 capsule 0   No facility-administered medications prior to visit.     Review of Systems  Constitutional: Positive for fatigue. Negative for chills, diaphoresis, fever, malaise/fatigue and weight loss.  HENT: Negative for ear discharge, ear pain, hoarse voice, sore throat and trouble swallowing.   Eyes: Negative for blurred vision and photophobia.  Respiratory: Negative for cough,  sputum production, shortness of breath and wheezing.   Cardiovascular: Negative for chest pain, palpitations, leg swelling and syncope.  Gastrointestinal: Positive for constipation and diarrhea. Negative for abdominal pain, blood in stool, heartburn, melena and nausea.  Genitourinary: Positive for menstrual problem. Negative for dysuria, frequency, hematuria and urgency.  Musculoskeletal: Negative for back pain, joint pain, myalgias and neck pain.  Skin: Negative for rash.  Neurological: Negative for dizziness, tingling, sensory change, focal weakness, weakness, numbness and headaches.  Endo/Heme/Allergies: Negative for environmental allergies, cold intolerance, heat intolerance and polydipsia. Does not bruise/bleed easily.  Psychiatric/Behavioral: Negative for depression and suicidal ideas. The patient is nervous/anxious. The patient does not have insomnia.      Objective  Vitals:   11/01/17 1510  BP: 120/80  Pulse: 80  Weight: 212 lb (96.2 kg)  Height: 5\' 11"  (1.803 m)    Physical Exam  Constitutional: No distress.  HENT:  Head: Normocephalic and atraumatic.  Right Ear: Hearing, tympanic membrane, external ear and ear canal normal.  Left Ear: Hearing, tympanic membrane, external ear and ear canal normal.  Nose: Nose normal. No mucosal edema.  Mouth/Throat: Oropharynx is clear and moist. No oropharyngeal exudate, posterior oropharyngeal edema or posterior  oropharyngeal erythema.  Eyes: Pupils are equal, round, and reactive to light. Conjunctivae and EOM are normal. Right eye exhibits no discharge. Left eye exhibits no discharge.  Neck: Normal range of motion. Neck supple. No JVD present. No thyromegaly present.  Cardiovascular: Normal rate, regular rhythm, normal heart sounds and intact distal pulses. Exam reveals no gallop and no friction rub.  No murmur heard. Pulmonary/Chest: Effort normal and breath sounds normal.  Abdominal: Soft. Bowel sounds are normal. She exhibits no mass. There is no hepatosplenomegaly. There is no tenderness. There is no rigidity, no rebound, no guarding and no CVA tenderness.  Musculoskeletal: Normal range of motion. She exhibits no edema.       Left knee: She exhibits deformity.       Legs: Mass/?pes anserine bursa  Lymphadenopathy:       Head (right side): No submandibular adenopathy present.       Head (left side): No submandibular adenopathy present.    She has cervical adenopathy.       Right cervical: Superficial cervical and deep cervical adenopathy present.       Left cervical: Superficial cervical and deep cervical adenopathy present.  Neurological: She is alert. She has normal reflexes.  Skin: Skin is warm and dry. She is not diaphoretic.  Nursing note and vitals reviewed.     Assessment & Plan  Problem List Items Addressed This Visit    None    Visit Diagnoses    Fatigue, unspecified type    -  Primary   Chronic  Will evaluate cbc,B12 and TSH   Relevant Orders   CBC   B12   TSH   Hx of non anemic vitamin B12 deficiency       B12 defenciency require injection, Now oils taken with gradually decrease of stamina. will check cbc and B12.   Relevant Orders   B12   Neck fullness       Dentist noted fullness of cervical area. S/P mono illness with persistence. Will check soft tissue ultrasound   Relevant Orders   US Soft Tissue Head/Neck   Knee mass, left       Acute Fullness of left knee  medial aspect ? pes anserine vs lipoma.   Relevant Orders   Ambulatory referral to Orthopedic  Surgery   Taking medication for chronic disease       Noted hyperechogenicity of liver c/w steatosis/ will check hepatic panel.    Relevant Orders   Hepatic Function Panel (6)   Encounter for screening for HIV       Persistent fatigue. Check HIV   Relevant Orders   HIV antibody   Need for hepatitis C screening test       Persistent fatigue Check hep c status.   Relevant Orders   Hepatitis C antibody   Flu vaccine need       Discussed and administered.   Relevant Orders   Flu Vaccine QUAD 6+ mos PF IM (Fluarix Quad PF) (Completed)      No orders of the defined types were placed in this encounter.     Dr. [facility]  11/01/17

## 2017-11-02 LAB — HEPATIC FUNCTION PANEL (6)
ALBUMIN: 4.1 g/dL (ref 3.5–5.5)
ALT: 24 IU/L (ref 0–32)
AST: 21 IU/L (ref 0–40)
Alkaline Phosphatase: 91 IU/L (ref 39–117)
BILIRUBIN TOTAL: 0.3 mg/dL (ref 0.0–1.2)
Bilirubin, Direct: 0.09 mg/dL (ref 0.00–0.40)

## 2017-11-02 LAB — CBC
Hematocrit: 36.5 % (ref 34.0–46.6)
Hemoglobin: 12.2 g/dL (ref 11.1–15.9)
MCH: 29 pg (ref 26.6–33.0)
MCHC: 33.4 g/dL (ref 31.5–35.7)
MCV: 87 fL (ref 79–97)
PLATELETS: 223 10*3/uL (ref 150–450)
RBC: 4.2 x10E6/uL (ref 3.77–5.28)
RDW: 13.1 % (ref 12.3–15.4)
WBC: 6.3 10*3/uL (ref 3.4–10.8)

## 2017-11-02 LAB — VITAMIN B12: VITAMIN B 12: 826 pg/mL (ref 232–1245)

## 2017-11-02 LAB — HIV ANTIBODY (ROUTINE TESTING W REFLEX): HIV Screen 4th Generation wRfx: NONREACTIVE

## 2017-11-02 LAB — HEPATITIS C ANTIBODY: Hep C Virus Ab: 0.2 s/co ratio (ref 0.0–0.9)

## 2017-11-02 LAB — TSH: TSH: 0.91 u[IU]/mL (ref 0.450–4.500)

## 2017-11-04 ENCOUNTER — Ambulatory Visit: Payer: BC Managed Care – PPO

## 2017-11-09 ENCOUNTER — Ambulatory Visit
Admission: RE | Admit: 2017-11-09 | Discharge: 2017-11-09 | Disposition: A | Discharge status: Home / Self Care | Payer: BC Managed Care – PPO | Source: Ambulatory Visit | Attending: Family Medicine | Admitting: Family Medicine

## 2017-11-09 DIAGNOSIS — R221 Localized swelling, mass and lump, neck: Secondary | ICD-10-CM | POA: Diagnosis not present

## 2017-12-02 ENCOUNTER — Other Ambulatory Visit: Payer: Self-pay | Admitting: Family Medicine

## 2017-12-02 DIAGNOSIS — K219 Gastro-esophageal reflux disease without esophagitis: Secondary | ICD-10-CM

## 2017-12-02 DIAGNOSIS — I10 Essential (primary) hypertension: Secondary | ICD-10-CM

## 2017-12-15 ENCOUNTER — Other Ambulatory Visit: Payer: Self-pay | Admitting: Family Medicine

## 2017-12-15 DIAGNOSIS — Z1231 Encounter for screening mammogram for malignant neoplasm of breast: Secondary | ICD-10-CM

## 2017-12-19 ENCOUNTER — Encounter: Payer: Self-pay | Admitting: Emergency Medicine

## 2017-12-19 ENCOUNTER — Ambulatory Visit (INDEPENDENT_AMBULATORY_CARE_PROVIDER_SITE_OTHER): Payer: BC Managed Care – PPO

## 2017-12-19 ENCOUNTER — Other Ambulatory Visit: Payer: Self-pay

## 2017-12-19 ENCOUNTER — Ambulatory Visit
Admission: EM | Admit: 2017-12-19 | Discharge: 2017-12-19 | Disposition: A | Discharge status: Home / Self Care | Payer: BC Managed Care – PPO | Attending: Family Medicine | Admitting: Family Medicine

## 2017-12-19 DIAGNOSIS — J208 Acute bronchitis due to other specified organisms: Secondary | ICD-10-CM | POA: Diagnosis not present

## 2017-12-19 DIAGNOSIS — R509 Fever, unspecified: Secondary | ICD-10-CM

## 2017-12-19 DIAGNOSIS — R05 Cough: Secondary | ICD-10-CM | POA: Diagnosis not present

## 2017-12-19 DIAGNOSIS — B9789 Other viral agents as the cause of diseases classified elsewhere: Secondary | ICD-10-CM

## 2017-12-19 MED ORDER — PREDNISONE 20 MG PO TABS: ORAL_TABLET | ORAL | 0 refills | Status: DC

## 2017-12-19 NOTE — ED Provider Notes (Signed)
[facility]    CSN: [id number] Arrival date & time: 12/19/17  0801     History   Chief Complaint Chief Complaint  Patient presents with  . Cough  . Fever    HPI Sherri Matthews is a 52 y.o. female.   The history is provided by the patient.  Cough  Cough characteristics:  Productive Sputum characteristics:  Clear Severity:  Moderate Onset quality:  Sudden Duration:  4 days Timing:  Constant Progression:  Worsening Chronicity:  New Smoker: no   Context: sick contacts   Context: not animal exposure, not exposure to allergens, not fumes, not occupational exposure, not smoke exposure, not upper respiratory infection, not weather changes and not with activity   Relieved by:  None tried Worsened by:  Lying down Ineffective treatments:  None tried Associated symptoms: chills, fever and shortness of breath   Associated symptoms: no chest pain, no diaphoresis, no ear fullness, no ear pain, no eye discharge, no headaches, no myalgias, no rash, no rhinorrhea, no sinus congestion, no sore throat, no weight loss and no wheezing   Fever  Associated symptoms: chills and cough   Associated symptoms: no chest pain, no ear pain, no headaches, no myalgias, no rash, no rhinorrhea and no sore throat     Past Medical History:  Diagnosis Date  . Anxiety   . AV node dysfunction    tachycardia sees Dr. Lady Gary  . Cervical disc disease    degenerative disk   . Chronic abdominal pain   . Heart murmur   . Hyperlipidemia   . Hypertension   . IBS (irritable bowel syndrome)   . Mitral valve prolapse   . Palpitations   . Sleep apnea    possible but not tested    Patient Active Problem List   Diagnosis Date Noted  . Chest pain 06/19/2016  . Abnormal EKG 06/19/2016  . Menopause 01/28/2016  . Essential hypertension 05/15/2015  . Hyperlipidemia 05/15/2015  . Gastritis   . 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 Anxiety disorder due to known  physiological condition 07/16/2014  . 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 Heart murmur 07/16/2014  . Gastroesophageal reflux disease 07/16/2014  . H/O: osteoarthritis 07/16/2014    Past Surgical History:  Procedure Laterality Date  . APPENDECTOMY  2008  . CARDIAC CATHETERIZATION N/A 09/12/2015   Procedure: Left Heart Cath and Coronary Angiography;  Surgeon: Marcina Millard, MD;  Location: [facility];  Service: Cardiovascular;  Laterality: N/A;  . ESOPHAGOGASTRODUODENOSCOPY (EGD) WITH PROPOFOL N/A 04/18/2015   Procedure: ESOPHAGOGASTRODUODENOSCOPY (EGD) WITH PROPOFOL;  Surgeon: Midge Minium, MD;  Location: [facility];  Service: Endoscopy;  Laterality: N/A;  . HERNIA REPAIR      OB History   None      Home Medications    Prior to Admission medications   Medication Sig Start Date End Date Taking? Authorizing Provider  ALPRAZolam (XANAX) 0.5 MG tablet TAKE ONE TABLET BY MOUTH ONCE DAILY AS NEEDED 11/25/16  Yes Duanne Limerick, MD  ferrous sulfate 325 (65 FE) MG tablet Take 325 mg by mouth daily with breakfast.   Yes [provider]  hydrochlorothiazide (HYDRODIURIL) 12.5 MG tablet TAKE 1 TABLET BY MOUTH ONCE DAILY 12/02/17  Yes Duanne Limerick, MD  ibuprofen (ADVIL,MOTRIN) 200 MG tablet Take 200 mg by mouth every 6 (six) hours as needed.   Yes [provider]  metoprolol tartrate (LOPRESSOR) 25 MG tablet TAKE 1/2 (ONE-HALF) TABLET BY MOUTH  TWICE DAILY 12/02/17  Yes Duanne Limerick, MD  Multiple Vitamins-Minerals (ONE-A-DAY WOMENS 50 PLUS PO) Take 1 tablet by mouth daily.   Yes [provider]  omeprazole (PRILOSEC) 40 MG capsule Take 1 capsule (40 mg total) by mouth daily. 12/02/17  Yes Duanne Limerick, MD  simvastatin (ZOCOR) 40 MG tablet TAKE 1 TABLET BY MOUTH ONCE DAILY 12/02/17  Yes Duanne Limerick, MD  Cholecalciferol (VITAMIN D-3) 1000 units CAPS Take 1 capsule by mouth daily.    [provider]    Cyanocobalamin (B-12) 1000 MCG CAPS Take by mouth.    [provider]  magnesium oxide (MAG-OX) 400 MG tablet Take 400 mg by mouth daily.    [provider]  predniSONE (DELTASONE) 20 MG tablet 1 tab po qd x 5 days 12/19/17   Payton Mccallum, MD    Family History Family History  Problem Relation Age of Onset  . Atrial fibrillation Mother   . Hypertension Mother   . Diabetes Mother   . Atrial fibrillation Father   . Breast cancer Paternal Aunt        pat great aunt    Social History Social History   Tobacco Use  . Smoking status: Never Smoker  . Smokeless tobacco: Never Used  Substance Use Topics  . Alcohol use: No    Alcohol/week: 0.0 standard drinks  . Drug use: No     Allergies   Oxycodone-acetaminophen; Oxycodone-acetaminophen; Ciprofloxacin; Dilaudid [hydromorphone hcl]; Fenofibrate; Sertraline; and Vicodin [hydrocodone-acetaminophen]   Review of Systems Review of Systems  Constitutional: Positive for chills and fever. Negative for diaphoresis and weight loss.  HENT: Negative for ear pain, rhinorrhea and sore throat.   Eyes: Negative for discharge.  Respiratory: Positive for cough and shortness of breath. Negative for wheezing.   Cardiovascular: Negative for chest pain.  Musculoskeletal: Negative for myalgias.  Skin: Negative for rash.  Neurological: Negative for headaches.     Physical Exam Triage Vital Signs ED Triage Vitals  Enc Vitals Group     BP 12/19/17 0815 100/81     Pulse Rate 12/19/17 0815 71     Resp 12/19/17 0815 18     Temp 12/19/17 0815 99.3 F (37.4 C)     Temp Source 12/19/17 0815 Oral     SpO2 12/19/17 0815 97 %     Weight 12/19/17 0812 205 lb (93 kg)     Height 12/19/17 0812 5\' 10"  (1.778 m)     Head Circumference --      Peak Flow --      Pain Score 12/19/17 0812 3     Pain Loc --      Pain Edu? --      Excl. in GC? --    No data found.  Updated Vital Signs BP 100/81 (BP Location: Left Arm)   Pulse 71    Temp 99.3 F (37.4 C) (Oral)   Resp 18   Ht 5\' 10"  (1.778 m)   Wt 93 kg   SpO2 97%   BMI 29.41 kg/m   Visual Acuity Right Eye Distance:   Left Eye Distance:   Bilateral Distance:    Right Eye Near:   Left Eye Near:    Bilateral Near:     Physical Exam  Constitutional: She appears well-developed and well-nourished. No distress.  HENT:  Head: Normocephalic and atraumatic.  Nose: No mucosal edema, rhinorrhea, nose lacerations, sinus tenderness, nasal deformity, septal deviation or nasal septal hematoma. No epistaxis.  No  foreign bodies. Right sinus exhibits no maxillary sinus tenderness and no frontal sinus tenderness. Left sinus exhibits no maxillary sinus tenderness and no frontal sinus tenderness.  Mouth/Throat: Uvula is midline and mucous membranes are normal. No oropharyngeal exudate.  Eyes: Conjunctivae are normal. Right eye exhibits no discharge. Left eye exhibits no discharge. No scleral icterus.  Neck: Normal range of motion. Neck supple.  Cardiovascular: Normal rate, regular rhythm and normal heart sounds.  Pulmonary/Chest: Effort normal. No stridor. No respiratory distress. She has no wheezes. She has no rales.  Rhonchi at bases bilaterally  Skin: She is not diaphoretic.  Nursing note and vitals reviewed.    UC Treatments / Results  Labs (all labs ordered are listed, but only abnormal results are displayed) Labs Reviewed - No data to display  EKG None  Radiology Dg Chest 2 View  Result Date: 12/19/2017 CLINICAL DATA:  Nonproductive cough for several days with fever, initial encounter EXAM: CHEST - 2 VIEW COMPARISON:  06/19/2016 FINDINGS: The heart size and mediastinal contours are within normal limits. Both lungs are clear. The visualized skeletal structures are unremarkable. IMPRESSION: No active cardiopulmonary disease. Electronically Signed   By: Alcide Clever M.D.   On: 12/19/2017 08:55    Procedures Procedures (including critical care time)  Medications  Ordered in UC Medications - No data to display  Initial Impression / Assessment and Plan / UC Course  I have reviewed the triage vital signs and the nursing notes.  Pertinent labs & imaging results that were available during my care of the patient were reviewed by me and considered in my medical decision making (see chart for details).      Final Clinical Impressions(s) / UC Diagnoses   Final diagnoses:  Viral bronchitis     Discharge Instructions     Rest, fluids, robitussin DM or Mucinex DM    ED Prescriptions    Medication Sig Dispense Auth. Provider   predniSONE (DELTASONE) 20 MG tablet 1 tab po qd x 5 days 5 tablet Ebonee Stober, MD     1. x-ray results and diagnosis reviewed with patient  2. rx as per orders above; reviewed possible side effects, interactions, risks and benefits  3. Recommend supportive treatment as above 4. Follow-up prn if symptoms worsen or don't improve   Controlled Substance Prescriptions [place] Controlled Substance Registry consulted? Not Applicable   Payton Mccallum, MD 12/19/17 [phone]

## 2017-12-19 NOTE — Discharge Instructions (Signed)
Rest, fluids, robitussin DM or Mucinex DM

## 2017-12-19 NOTE — ED Triage Notes (Signed)
Patient c/o chest congestion and cough that started Friday. Patient reports low grade temp that started on Saturday. Patient c/o rib/back pain with coughing.

## 2017-12-20 ENCOUNTER — Telehealth: Payer: Self-pay | Admitting: Family Medicine

## 2017-12-20 MED ORDER — MELOXICAM 15 MG PO TABS: 15.0000 mg | ORAL_TABLET | Freq: Every day | ORAL | 0 refills | Status: DC | PRN

## 2017-12-20 NOTE — Telephone Encounter (Signed)
Patient still experiencing pleuritic pain. Sending in Mobic.

## 2017-12-27 ENCOUNTER — Ambulatory Visit: Payer: BC Managed Care – PPO

## 2017-12-28 ENCOUNTER — Ambulatory Visit
Admission: RE | Admit: 2017-12-28 | Discharge: 2017-12-28 | Disposition: A | Discharge status: Home / Self Care | Payer: BC Managed Care – PPO | Source: Ambulatory Visit | Attending: Family Medicine | Admitting: Family Medicine

## 2017-12-28 DIAGNOSIS — Z1231 Encounter for screening mammogram for malignant neoplasm of breast: Secondary | ICD-10-CM | POA: Diagnosis present

## 2018-02-21 ENCOUNTER — Ambulatory Visit: Payer: Self-pay | Admitting: Family Medicine

## 2018-02-24 ENCOUNTER — Encounter: Payer: Self-pay | Admitting: Family Medicine

## 2018-02-24 ENCOUNTER — Ambulatory Visit: Payer: BC Managed Care – PPO | Admitting: Family Medicine

## 2018-02-24 VITALS — BP 100/64 | HR 72 | Ht 70.0 in | Wt 222.0 lb

## 2018-02-24 DIAGNOSIS — F064 Anxiety disorder due to known physiological condition: Secondary | ICD-10-CM | POA: Diagnosis not present

## 2018-02-24 DIAGNOSIS — E782 Mixed hyperlipidemia: Secondary | ICD-10-CM | POA: Diagnosis not present

## 2018-02-24 DIAGNOSIS — I10 Essential (primary) hypertension: Secondary | ICD-10-CM | POA: Diagnosis not present

## 2018-02-24 DIAGNOSIS — D509 Iron deficiency anemia, unspecified: Secondary | ICD-10-CM

## 2018-02-24 DIAGNOSIS — K219 Gastro-esophageal reflux disease without esophagitis: Secondary | ICD-10-CM

## 2018-02-24 MED ORDER — SIMVASTATIN 40 MG PO TABS: 40.0000 mg | ORAL_TABLET | Freq: Every day | ORAL | 1 refills | Status: DC

## 2018-02-24 MED ORDER — METOPROLOL TARTRATE 25 MG PO TABS: ORAL_TABLET | ORAL | 1 refills | Status: DC

## 2018-02-24 MED ORDER — FERROUS SULFATE 325 (65 FE) MG PO TABS: 325.0000 mg | ORAL_TABLET | Freq: Every day | ORAL | 1 refills | Status: DC

## 2018-02-24 MED ORDER — HYDROCHLOROTHIAZIDE 12.5 MG PO TABS: 12.5000 mg | ORAL_TABLET | Freq: Every day | ORAL | 1 refills | Status: DC

## 2018-02-24 MED ORDER — ALPRAZOLAM 0.5 MG PO TABS: ORAL_TABLET | ORAL | 2 refills | Status: DC

## 2018-02-24 MED ORDER — OMEPRAZOLE 40 MG PO CPDR: 40.0000 mg | DELAYED_RELEASE_CAPSULE | Freq: Every day | ORAL | 0 refills | Status: DC

## 2018-02-24 NOTE — Progress Notes (Signed)
Date:  02/24/2018   Name:  Sherri Matthews   DOB:  11-Jan-1966   MRN:  [id number]   Chief Complaint: Anemia; Hypertension; Hyperlipidemia; and Anxiety (takes Alprazolam)  Anemia  Presents for follow-up visit. There has been no abdominal pain, anorexia, bruising/bleeding easily, confusion, fever, leg swelling, light-headedness, malaise/fatigue, pallor, palpitations, paresthesias, pica or weight loss. Signs of blood loss that are not present include hematemesis, hematochezia, melena, menorrhagia and vaginal bleeding. There is no history of chronic renal disease, heart failure or hypothyroidism. There are no compliance problems.   Hypertension  This is a chronic problem. The current episode started in the past 7 days. The problem is unchanged. The problem is controlled. Associated symptoms include anxiety. Pertinent negatives include no blurred vision, chest pain, headaches, malaise/fatigue, neck pain, orthopnea, palpitations, peripheral edema, PND or shortness of breath. There are no associated agents to hypertension. Risk factors for coronary artery disease include dyslipidemia and obesity. Past treatments include beta blockers and diuretics. The current treatment provides moderate improvement. There are no compliance problems.  There is no history of angina, kidney disease, CAD/MI, CVA, heart failure, left ventricular hypertrophy, PVD or retinopathy. There is no history of chronic renal disease, a hypertension causing med or renovascular disease.  Hyperlipidemia  This is a chronic problem. The current episode started more than 1 year ago. The problem is controlled. Recent lipid tests were reviewed and are normal. Exacerbating diseases include obesity. She has no history of chronic renal disease, diabetes, hypothyroidism, liver disease or nephrotic syndrome. Factors aggravating her hyperlipidemia include thiazides. Pertinent negatives include no chest pain, myalgias or shortness of breath. Current  antihyperlipidemic treatment includes statins. The current treatment provides moderate improvement of lipids. There are no compliance problems.   Anxiety  Presents for follow-up visit. Patient reports no chest pain, compulsions, confusion, decreased concentration, depressed mood, dizziness, dry mouth, excessive worry, feeling of choking, hyperventilation, impotence, insomnia, irritability, malaise, muscle tension, nausea, nervous/anxious behavior, obsessions, palpitations, panic, restlessness, shortness of breath or suicidal ideas. Symptoms occur occasionally. The severity of symptoms is mild. The quality of sleep is good.   Her past medical history is significant for anemia.    Review of Systems  Constitutional: Negative.  Negative for chills, fatigue, fever, irritability, malaise/fatigue, unexpected weight change and weight loss.  HENT: Negative for congestion, ear discharge, ear pain, rhinorrhea, sinus pressure, sneezing and sore throat.   Eyes: Negative for blurred vision, photophobia, pain, discharge, redness and itching.  Respiratory: Negative for cough, shortness of breath, wheezing and stridor.   Cardiovascular: Negative for chest pain, palpitations, orthopnea and PND.  Gastrointestinal: Negative for abdominal pain, anorexia, blood in stool, constipation, diarrhea, hematemesis, hematochezia, melena, nausea and vomiting.  Endocrine: Negative for cold intolerance, heat intolerance, polydipsia, polyphagia and polyuria.  Genitourinary: Negative for dysuria, flank pain, frequency, hematuria, impotence, menorrhagia, menstrual problem, pelvic pain, urgency, vaginal bleeding and vaginal discharge.  Musculoskeletal: Negative for arthralgias, back pain, myalgias and neck pain.  Skin: Negative for pallor and rash.  Allergic/Immunologic: Negative for environmental allergies and food allergies.  Neurological: Negative for dizziness, weakness, light-headedness, numbness, headaches and paresthesias.    Hematological: Negative for adenopathy. Does not bruise/bleed easily.  Psychiatric/Behavioral: Negative for confusion, decreased concentration, dysphoric mood and suicidal ideas. The patient is not nervous/anxious and does not have insomnia.     Patient Active Problem List   Diagnosis Date Noted  . Chest pain 06/19/2016  . Abnormal EKG 06/19/2016  . Menopause 01/28/2016  . Essential hypertension 05/15/2015  .  Hyperlipidemia 05/15/2015  . Gastritis   . 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 Heart murmur 07/16/2014  . Gastroesophageal reflux disease 07/16/2014  . H/O: osteoarthritis 07/16/2014    Allergies  Allergen Reactions  . Oxycodone-Acetaminophen Itching  . Oxycodone-Acetaminophen Itching  . Ciprofloxacin Nausea And Vomiting  . Dilaudid [Hydromorphone Hcl]   . Fenofibrate     myalgia  . Sertraline Other (See Comments)    Pt prefers to never be on this again.    . Vicodin [Hydrocodone-Acetaminophen] Other (See Comments)    Heart raced.    Past Surgical History:  Procedure Laterality Date  . APPENDECTOMY  2008  . CARDIAC CATHETERIZATION N/A 09/12/2015   Procedure: Left Heart Cath and Coronary Angiography;  Surgeon: Marcina Millard, MD;  Location: [facility];  Service: Cardiovascular;  Laterality: N/A;  . ESOPHAGOGASTRODUODENOSCOPY (EGD) WITH PROPOFOL N/A 04/18/2015   Procedure: ESOPHAGOGASTRODUODENOSCOPY (EGD) WITH PROPOFOL;  Surgeon: Midge Minium, MD;  Location: [facility];  Service: Endoscopy;  Laterality: N/A;  . HERNIA REPAIR      Social History   Tobacco Use  . Smoking status: Never Smoker  . Smokeless tobacco: Never Used  Substance Use Topics  . Alcohol use: No    Alcohol/week: 0.0 standard drinks  . Drug use: No     Medication list has been reviewed and updated.  Current Meds   Medication Sig  . ALPRAZolam (XANAX) 0.5 MG tablet TAKE ONE TABLET BY MOUTH ONCE DAILY AS NEEDED  . B Complex-C (SUPER B COMPLEX PO) Take 1 tablet by mouth daily.  . ferrous sulfate 325 (65 FE) MG tablet Take 325 mg by mouth daily with breakfast.  . hydrochlorothiazide (HYDRODIURIL) 12.5 MG tablet TAKE 1 TABLET BY MOUTH ONCE DAILY  . ibuprofen (ADVIL,MOTRIN) 200 MG tablet Take 200 mg by mouth every 6 (six) hours as needed.  . metoprolol tartrate (LOPRESSOR) 25 MG tablet TAKE 1/2 (ONE-HALF) TABLET BY MOUTH TWICE DAILY  . omeprazole (PRILOSEC) 40 MG capsule Take 1 capsule (40 mg total) by mouth daily.  . simvastatin (ZOCOR) 40 MG tablet TAKE 1 TABLET BY MOUTH ONCE DAILY  . [DISCONTINUED] Cholecalciferol (VITAMIN D-3) 1000 units CAPS Take 1 capsule by mouth daily.  . [DISCONTINUED] Cyanocobalamin (B-12) 1000 MCG CAPS Take by mouth.    PHQ 2/9 Scores 11/25/2016 05/05/2015  PHQ - 2 Score 0 0  PHQ- 9 Score 2 -    Physical Exam Vitals signs and nursing note reviewed.  Constitutional:      General: She is not in acute distress.    Appearance: She is not diaphoretic.  HENT:     Head: Normocephalic and atraumatic.     Right Ear: Tympanic membrane, ear canal and external ear normal.     Left Ear: Tympanic membrane, ear canal and external ear normal.     Nose: Nose normal.  Eyes:     General:        Right eye: No discharge.        Left eye: No discharge.     Extraocular Movements: Extraocular movements intact.     Conjunctiva/sclera: Conjunctivae normal.     Pupils: Pupils are equal, round, and reactive to light.  Neck:     Musculoskeletal: Normal range of motion and neck supple.     Thyroid: No thyromegaly.     Vascular: No JVD.  Cardiovascular:     Rate  and Rhythm: Normal rate and regular rhythm.     Heart sounds: Normal heart sounds. No murmur. No friction rub. No gallop.   Pulmonary:     Effort: Pulmonary effort is normal. No respiratory distress.     Breath sounds: Normal breath  sounds. No stridor. No wheezing, rhonchi or rales.  Chest:     Chest wall: No tenderness.  Abdominal:     General: Bowel sounds are normal.     Palpations: Abdomen is soft. There is no mass.     Tenderness: There is no abdominal tenderness. There is no guarding.  Musculoskeletal: Normal range of motion.  Lymphadenopathy:     Cervical: No cervical adenopathy.  Skin:    General: Skin is warm and dry.  Neurological:     Mental Status: She is alert.     Deep Tendon Reflexes: Reflexes are normal and symmetric.     BP 100/64   Pulse 72   Ht 5\' 10"  (1.778 m)   Wt 222 lb (100.7 kg)   BMI 31.85 kg/m   Assessment and Plan: 1. Essential (primary) hypertension Chronic.  Controlled.  Continue metoprolol 25 mg 1/2 tablet twice a day and hydrochlorothiazide 12.5 mg once a day will check renal function panel. - ALPRAZolam (XANAX) 0.5 MG tablet; TAKE ONE TABLET BY MOUTH ONCE DAILY AS NEEDED  Dispense: 30 tablet; Refill: 2 - simvastatin (ZOCOR) 40 MG tablet; Take 1 tablet (40 mg total) by mouth daily.  Dispense: 90 tablet; Refill: 1 - metoprolol tartrate (LOPRESSOR) 25 MG tablet; TAKE 1/2 (ONE-HALF) TABLET BY MOUTH TWICE DAILY  Dispense: 90 tablet; Refill: 1 - hydrochlorothiazide (HYDRODIURIL) 12.5 MG tablet; Take 1 tablet (12.5 mg total) by mouth daily.  Dispense: 90 tablet; Refill: 1 - Renal Function Panel  2. 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  Episodic.  Patient prescribed Xanax 0.5 as needed for panic attack - ALPRAZolam (XANAX) 0.5 MG tablet; TAKE ONE TABLET BY MOUTH ONCE DAILY AS NEEDED  Dispense: 30 tablet; Refill: 2  3. Gastroesophageal reflux disease, esophagitis presence not specified Chronic.  Episodic.  Patient will continue omeprazole 40 mg daily. - omeprazole (PRILOSEC) 40 MG capsule; Take 1 capsule (40 mg total) by mouth daily.  Dispense: 90 capsule; Refill: 0  4. Mixed hyperlipidemia Chronic.   Controlled.  Continue simvastatin 40 mg once a day.   Will check blood lipid panel. - Lipid panel  5. Iron deficiency anemia, unspecified iron deficiency anemia type Chronic.  Controlled.  Will check hemoglobin.  Ferrous sulfate 325 once a day. - Hemoglobin

## 2018-02-25 LAB — HEMOGLOBIN: HEMOGLOBIN: 13.3 g/dL (ref 11.1–15.9)

## 2018-02-25 LAB — RENAL FUNCTION PANEL
Albumin: 4.2 g/dL (ref 3.5–5.5)
BUN / CREAT RATIO: 20 (ref 9–23)
BUN: 16 mg/dL (ref 6–24)
CALCIUM: 9.4 mg/dL (ref 8.7–10.2)
CHLORIDE: 102 mmol/L (ref 96–106)
CO2: 22 mmol/L (ref 20–29)
Creatinine, Ser: 0.79 mg/dL (ref 0.57–1.00)
GFR calc Af Amer: 98 mL/min/{1.73_m2} (ref >59)
GFR calc non Af Amer: 85 mL/min/{1.73_m2} (ref >59)
GLUCOSE: 99 mg/dL (ref 65–99)
PHOSPHORUS: 3.5 mg/dL (ref 2.5–4.5)
Potassium: 4.2 mmol/L (ref 3.5–5.2)
Sodium: 139 mmol/L (ref 134–144)

## 2018-02-25 LAB — LIPID PANEL
CHOL/HDL RATIO: 4.6 ratio — AB (ref 0.0–4.4)
Cholesterol, Total: 216 mg/dL — ABNORMAL HIGH (ref 100–199)
HDL: 47 mg/dL (ref >39)
LDL Calculated: 119 mg/dL — ABNORMAL HIGH (ref 0–99)
TRIGLYCERIDES: 251 mg/dL — AB (ref 0–149)
VLDL Cholesterol Cal: 50 mg/dL — ABNORMAL HIGH (ref 5–40)

## 2018-04-14 ENCOUNTER — Other Ambulatory Visit: Payer: Self-pay

## 2018-04-14 ENCOUNTER — Encounter: Payer: Self-pay | Admitting: Emergency Medicine

## 2018-04-14 ENCOUNTER — Ambulatory Visit (INDEPENDENT_AMBULATORY_CARE_PROVIDER_SITE_OTHER): Payer: BC Managed Care – PPO

## 2018-04-14 ENCOUNTER — Ambulatory Visit: Payer: BC Managed Care – PPO

## 2018-04-14 ENCOUNTER — Ambulatory Visit
Admission: EM | Admit: 2018-04-14 | Discharge: 2018-04-14 | Disposition: A | Discharge status: Home / Self Care | Payer: BC Managed Care – PPO | Attending: Family Medicine | Admitting: Family Medicine

## 2018-04-14 DIAGNOSIS — M542 Cervicalgia: Secondary | ICD-10-CM

## 2018-04-14 DIAGNOSIS — S161XXA Strain of muscle, fascia and tendon at neck level, initial encounter: Secondary | ICD-10-CM | POA: Diagnosis not present

## 2018-04-14 MED ORDER — METAXALONE 800 MG PO TABS: 800.0000 mg | ORAL_TABLET | Freq: Three times a day (TID) | ORAL | 0 refills | Status: DC

## 2018-04-14 MED ORDER — NAPROXEN 500 MG PO TABS: 500.0000 mg | ORAL_TABLET | Freq: Two times a day (BID) | ORAL | 0 refills | Status: DC

## 2018-04-14 MED ORDER — ONDANSETRON 8 MG PO TBDP: 8.0000 mg | ORAL_TABLET | Freq: Two times a day (BID) | ORAL | 0 refills | Status: DC

## 2018-04-14 MED ORDER — KETOROLAC TROMETHAMINE 60 MG/2ML IM SOLN
60.0000 mg | Freq: Once | INTRAMUSCULAR | Status: AC
  Administered 2018-04-14: 60 mg via INTRAMUSCULAR

## 2018-04-14 NOTE — Discharge Instructions (Signed)
Apply ice 20 minutes out of every 2 hours 4-5 times daily for comfort. Use  Caution while taking muscle relaxers.  Do not perform activities requiring concentration or judgment and do not drive.  °

## 2018-04-14 NOTE — ED Triage Notes (Signed)
Patient states she started to have left side neck pain and shoulder pain that started over the weekend. She has been doing ice and heat for her symptoms. The pain has now moved to the right side of her neck and she started vomiting last night. She took her BP through the night and it was elevated to 170/100. Denies chest pain but does report a "sore chest."

## 2018-04-14 NOTE — ED Provider Notes (Signed)
[facility]    CSN: [id number] Arrival date & time: 04/14/18  1051     History   Chief Complaint Chief Complaint  Patient presents with  . Muscle Pain    HPI Sherri Matthews is a 53 y.o. female.   HPI  53 year old female that started having left-sided neck pain leading into her trapezial area over the weekend.  Recently she has noticed now on her right side as well.  Using heat and ice as well as goal ointments.  Is been taking her blood pressure through the night and found to be elevated at 170/100 but today is 149/78.  She states that she has had that blood pressure cuff calibrated.  Did not sustain any injury and does not remember any activity which may have precipitated her pain        Past Medical History:  Diagnosis Date  . Anxiety   . AV node dysfunction    tachycardia sees Dr. Lady Gary  . Cervical disc disease    degenerative disk   . Chronic abdominal pain   . Heart murmur   . Hyperlipidemia   . Hypertension   . IBS (irritable bowel syndrome)   . Mitral valve prolapse   . Palpitations   . Sleep apnea    possible but not tested    Patient Active Problem List   Diagnosis Date Noted  . Chest pain 06/19/2016  . Abnormal EKG 06/19/2016  . Menopause 01/28/2016  . Essential hypertension 05/15/2015  . Hyperlipidemia 05/15/2015  . Gastritis   . 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 Heart murmur 07/16/2014  . Gastroesophageal reflux disease 07/16/2014  . H/O: osteoarthritis 07/16/2014    Past Surgical History:  Procedure Laterality Date  . APPENDECTOMY  2008  . CARDIAC CATHETERIZATION N/A 09/12/2015   Procedure: Left Heart Cath and Coronary Angiography;  Surgeon: Marcina Millard, MD;  Location: [facility];  Service: Cardiovascular;  Laterality: N/A;  .  ESOPHAGOGASTRODUODENOSCOPY (EGD) WITH PROPOFOL N/A 04/18/2015   Procedure: ESOPHAGOGASTRODUODENOSCOPY (EGD) WITH PROPOFOL;  Surgeon: Midge Minium, MD;  Location: [facility];  Service: Endoscopy;  Laterality: N/A;  . HERNIA REPAIR      OB History   No obstetric history on file.      Home Medications    Prior to Admission medications   Medication Sig Start Date End Date Taking? Authorizing Provider  ALPRAZolam Prudy Feeler) 0.5 MG tablet TAKE ONE TABLET BY MOUTH ONCE DAILY AS NEEDED 02/24/18  Yes Duanne Limerick, MD  B Complex-C (SUPER B COMPLEX PO) Take 1 tablet by mouth daily.   Yes [provider]  ferrous sulfate 325 (65 FE) MG tablet Take 1 tablet (325 mg total) by mouth daily with breakfast. 02/24/18  Yes Duanne Limerick, MD  hydrochlorothiazide (HYDRODIURIL) 12.5 MG tablet Take 1 tablet (12.5 mg total) by mouth daily. 02/24/18  Yes Duanne Limerick, MD  metoprolol tartrate (LOPRESSOR) 25 MG tablet TAKE 1/2 (ONE-HALF) TABLET BY MOUTH TWICE DAILY 02/24/18  Yes Duanne Limerick, MD  omeprazole (PRILOSEC) 40 MG capsule Take 1 capsule (40 mg total) by mouth daily. 02/24/18  Yes Duanne Limerick, MD  simvastatin (ZOCOR) 40 MG tablet Take 1 tablet (40 mg total) by mouth daily. 02/24/18  Yes Duanne Limerick, MD  ibuprofen (ADVIL,MOTRIN) 200 MG tablet Take 200 mg by mouth every 6 (six) hours as needed.  [provider]  metaxalone (SKELAXIN) 800 MG tablet Take 1 tablet (800 mg total) by mouth 3 (three) times daily. 04/14/18   Lutricia Feiloemer, Mahogany Torrance P, PA-C  naproxen (NAPROSYN) 500 MG tablet Take 1 tablet (500 mg total) by mouth 2 (two) times daily with a meal. 04/14/18   Lutricia Feiloemer, Shaya Altamura P, PA-C  ondansetron (ZOFRAN ODT) 8 MG disintegrating tablet Take 1 tablet (8 mg total) by mouth 2 (two) times daily. 04/14/18   Lutricia Feiloemer, Dennise Bamber P, PA-C    Family History Family History  Problem Relation Age of Onset  . Atrial fibrillation Mother   . Hypertension Mother   . Diabetes Mother   . Atrial  fibrillation Father   . Breast cancer Maternal Aunt        mat great aunt    Social History Social History   Tobacco Use  . Smoking status: Never Smoker  . Smokeless tobacco: Never Used  Substance Use Topics  . Alcohol use: No    Alcohol/week: 0.0 standard drinks  . Drug use: No     Allergies   Oxycodone-acetaminophen; Oxycodone-acetaminophen; Ciprofloxacin; Dilaudid [hydromorphone hcl]; Fenofibrate; Sertraline; and Vicodin [hydrocodone-acetaminophen]   Review of Systems Review of Systems  Constitutional: Positive for activity change. Negative for appetite change, chills, fatigue and fever.  Musculoskeletal: Positive for neck pain and neck stiffness.  All other systems reviewed and are negative.    Physical Exam Triage Vital Signs ED Triage Vitals  Enc Vitals Group     BP 04/14/18 1111 (!) 149/78     Pulse Rate 04/14/18 1111 71     Resp 04/14/18 1111 18     Temp 04/14/18 1111 98.8 F (37.1 C)     Temp Source 04/14/18 1111 Oral     SpO2 04/14/18 1111 99 %     Weight 04/14/18 1108 205 lb (93 kg)     Height 04/14/18 1108 5\' 11"  (1.803 m)     Head Circumference --      Peak Flow --      Pain Score 04/14/18 1106 4     Pain Loc --      Pain Edu? --      Excl. in GC? --    No data found.  Updated Vital Signs BP (!) 149/78 (BP Location: Right Arm)   Pulse 71   Temp 98.8 F (37.1 C) (Oral)   Resp 18   Ht 5\' 11"  (1.803 m)   Wt 205 lb (93 kg)   SpO2 99%   BMI 28.59 kg/m   Visual Acuity Right Eye Distance:   Left Eye Distance:   Bilateral Distance:    Right Eye Near:   Left Eye Near:    Bilateral Near:     Physical Exam Vitals signs and nursing note reviewed.  Constitutional:      General: She is not in acute distress.    Appearance: Normal appearance. She is not ill-appearing, toxic-appearing or diaphoretic.  HENT:     Head: Normocephalic and atraumatic.     Nose: Nose normal.     Mouth/Throat:     Mouth: Mucous membranes are moist.     Pharynx:  Oropharynx is clear.  Eyes:     General:        Right eye: No discharge.        Left eye: No discharge.     Conjunctiva/sclera: Conjunctivae normal.  Neck:     Musculoskeletal: Muscular tenderness present.     Comments: Exam of the cervical spine  shows decreased range of motion flexion left and right rotation and left and right lateral flexion and extension.  Tenderness along the cervical spinous muscles left greater than right.  This extends into the trapezii bilaterally.  Her extremity strength is intact to clinical testing sensation is intact to light touch throughout.  DTRs are 1/4 but symmetrical. Pulmonary:     Effort: Pulmonary effort is normal.     Breath sounds: Normal breath sounds.  Musculoskeletal: Normal range of motion.     Comments: Refer to cervical spine exam  Skin:    General: Skin is warm and dry.  Neurological:     General: No focal deficit present.     Mental Status: She is alert and oriented to person, place, and time.  Psychiatric:        Mood and Affect: Mood normal.        Behavior: Behavior normal.        Thought Content: Thought content normal.        Judgment: Judgment normal.      UC Treatments / Results  Labs (all labs ordered are listed, but only abnormal results are displayed) Labs Reviewed - No data to display  EKG None  Radiology Dg Cervical Spine Complete  Result Date: 04/14/2018 CLINICAL DATA:  Bilateral neck pain for 5 days. EXAM: CERVICAL SPINE - COMPLETE 4+ VIEW COMPARISON:  None. FINDINGS: There is no evidence of cervical spine fracture or prevertebral soft tissue swelling. Alignment is normal. Narrowing of bilateral C2-3, C3-4 neural foramina due to osteophyte encroachment are noted. Decreased intervertebral space is identified at C3-4. IMPRESSION: Degenerative joint changes of the upper to mid cervical spine as described. No acute fracture. Electronically Signed   By: Sherian ReinWei-Chen  Lin M.D.   On: 04/14/2018 12:29     Procedures Procedures (including critical care time)  Medications Ordered in UC Medications  ketorolac (TORADOL) injection 60 mg (60 mg Intramuscular Given 04/14/18 1203)    Initial Impression / Assessment and Plan / UC Course  I have reviewed the triage vital signs and the nursing notes.  Pertinent labs & imaging results that were available during my care of the patient were reviewed by me and considered in my medical decision making (see chart for details).    Reviewed the x-rays with the patient.  Epic and degenerative disc disease at C3-4 and foraminal narrowing C2-3 and C3-4.  By my reading she also has flattening of the lordotic curve.  I have told her we will treat her conservatively at this point time.  She will need to follow-up with her primary care physician regarding her ongoing narrowing as noted above.  I will treat her with Naprosyn 500 mg twice daily with food.  I have also decided to give her Skelaxin with appropriate precautions.  She may use heat or ice alternating.  I have also recommended Biofreeze to help her as well.  Final Clinical Impressions(s) / UC Diagnoses   Final diagnoses:  Strain of neck muscle, initial encounter     Discharge Instructions     Apply ice 20 minutes out of every 2 hours 4-5 times daily for comfort. Use  Caution while taking muscle relaxers.  Do not perform activities requiring concentration or judgment and do not drive.     ED Prescriptions    Medication Sig Dispense Auth. Provider   naproxen (NAPROSYN) 500 MG tablet Take 1 tablet (500 mg total) by mouth 2 (two) times daily with a meal. 60 tablet Ovid Curdoemer, Shreyansh Tiffany  P, PA-C   metaxalone (SKELAXIN) 800 MG tablet Take 1 tablet (800 mg total) by mouth 3 (three) times daily. 21 tablet Ovid Curd P, PA-C   ondansetron (ZOFRAN ODT) 8 MG disintegrating tablet Take 1 tablet (8 mg total) by mouth 2 (two) times daily. 6 tablet Lutricia Feil, PA-C     Controlled Substance  Prescriptions [place] Controlled Substance Registry consulted? Not Applicable   Lutricia Feil, PA-C 04/14/18 1306

## 2018-05-07 ENCOUNTER — Other Ambulatory Visit: Payer: Self-pay | Admitting: Family Medicine

## 2018-05-07 DIAGNOSIS — K219 Gastro-esophageal reflux disease without esophagitis: Secondary | ICD-10-CM

## 2018-08-08 ENCOUNTER — Other Ambulatory Visit: Payer: Self-pay | Admitting: Family Medicine

## 2018-08-08 DIAGNOSIS — K219 Gastro-esophageal reflux disease without esophagitis: Secondary | ICD-10-CM

## 2018-09-20 ENCOUNTER — Other Ambulatory Visit: Payer: Self-pay

## 2018-09-20 ENCOUNTER — Encounter: Payer: Self-pay | Admitting: Family Medicine

## 2018-09-20 ENCOUNTER — Ambulatory Visit: Payer: BC Managed Care – PPO | Admitting: Family Medicine

## 2018-09-20 VITALS — BP 110/80 | HR 64 | Ht 71.0 in | Wt 221.0 lb

## 2018-09-20 DIAGNOSIS — D509 Iron deficiency anemia, unspecified: Secondary | ICD-10-CM

## 2018-09-20 DIAGNOSIS — E782 Mixed hyperlipidemia: Secondary | ICD-10-CM

## 2018-09-20 DIAGNOSIS — K219 Gastro-esophageal reflux disease without esophagitis: Secondary | ICD-10-CM | POA: Diagnosis not present

## 2018-09-20 DIAGNOSIS — I10 Essential (primary) hypertension: Secondary | ICD-10-CM

## 2018-09-20 DIAGNOSIS — R9389 Abnormal findings on diagnostic imaging of other specified body structures: Secondary | ICD-10-CM

## 2018-09-20 DIAGNOSIS — R69 Illness, unspecified: Secondary | ICD-10-CM | POA: Diagnosis not present

## 2018-09-20 MED ORDER — SIMVASTATIN 40 MG PO TABS: 40.0000 mg | ORAL_TABLET | Freq: Every day | ORAL | 1 refills | Status: DC

## 2018-09-20 MED ORDER — HYDROCHLOROTHIAZIDE 12.5 MG PO TABS: 12.5000 mg | ORAL_TABLET | Freq: Every day | ORAL | 1 refills | Status: DC

## 2018-09-20 MED ORDER — OMEPRAZOLE 40 MG PO CPDR: 40.0000 mg | DELAYED_RELEASE_CAPSULE | Freq: Two times a day (BID) | ORAL | 1 refills | Status: DC

## 2018-09-20 MED ORDER — METOPROLOL TARTRATE 25 MG PO TABS: ORAL_TABLET | ORAL | 1 refills | Status: DC

## 2018-09-20 NOTE — Progress Notes (Signed)
Date:  09/20/2018   Name:  Sherri Matthews   DOB:  September 21, 1965   MRN:  [id number]   Chief Complaint: Hypertension, Hyperlipidemia, Gastroesophageal Reflux, Anemia, and Anxiety (GAD7=1)  Hypertension This is a chronic problem. The current episode started more than 1 year ago. The problem is unchanged. The problem is controlled. Associated symptoms include anxiety and headaches. Pertinent negatives include no blurred vision, chest pain, malaise/fatigue, neck pain, orthopnea, palpitations, peripheral edema, PND, shortness of breath or sweats. There are no associated agents to hypertension. Past treatments include diuretics and beta blockers. The current treatment provides moderate improvement. There are no compliance problems.  There is no history of angina, kidney disease, CAD/MI, CVA, heart failure, left ventricular hypertrophy, PVD or retinopathy. There is no history of chronic renal disease, a hypertension causing med or renovascular disease.  Hyperlipidemia This is a chronic problem. The current episode started more than 1 year ago. The problem is controlled. Recent lipid tests were reviewed and are normal. She has no history of chronic renal disease, diabetes, hypothyroidism, liver disease, obesity or nephrotic syndrome. Factors aggravating her hyperlipidemia include thiazides. Pertinent negatives include no chest pain, focal sensory loss, focal weakness, leg pain, myalgias or shortness of breath. Current antihyperlipidemic treatment includes statins. The current treatment provides moderate improvement of lipids. There are no compliance problems.   Gastroesophageal Reflux She reports no abdominal pain, no belching, no chest pain, no choking, no coughing, no dysphagia, no early satiety, no globus sensation, no heartburn, no hoarse voice, no nausea, no sore throat, no water brash or no wheezing. This is a chronic problem. The current episode started more than 1 year ago. The problem occurs  constantly. The problem has been gradually improving. The symptoms are aggravated by certain foods. Pertinent negatives include no anemia, fatigue, melena, muscle weakness, orthopnea or weight loss. She has tried a PPI for the symptoms. The treatment provided moderate relief. Past procedures do not include esophageal pH monitoring or H. pylori antibody titer.  Anemia Presents for follow-up visit. There has been no abdominal pain, anorexia, bruising/bleeding easily, confusion, fever, leg swelling, light-headedness, malaise/fatigue, pallor, palpitations, paresthesias or weight loss. Signs of blood loss that are not present include hematemesis, hematochezia, melena, menorrhagia and vaginal bleeding. There is no history of chronic renal disease, heart failure or hypothyroidism. There are no compliance problems.   Anxiety Presents for follow-up visit. Symptoms include excessive worry and nervous/anxious behavior. Patient reports no chest pain, compulsions, confusion, decreased concentration, depressed mood, dizziness, feeling of choking, hyperventilation, impotence, insomnia, irritability, malaise, muscle tension, nausea, obsessions, palpitations, panic, restlessness or shortness of breath. The severity of symptoms is moderate.   Her past medical history is significant for anemia.    Review of Systems  Constitutional: Negative.  Negative for chills, fatigue, fever, irritability, malaise/fatigue, unexpected weight change and weight loss.  HENT: Negative for congestion, ear discharge, ear pain, hoarse voice, rhinorrhea, sinus pressure, sneezing and sore throat.   Eyes: Negative for blurred vision, photophobia, pain, discharge, redness and itching.  Respiratory: Negative for cough, choking, shortness of breath, wheezing and stridor.   Cardiovascular: Negative for chest pain, palpitations, orthopnea and PND.  Gastrointestinal: Negative for abdominal pain, anorexia, blood in stool, constipation, diarrhea,  dysphagia, heartburn, hematemesis, hematochezia, melena, nausea and vomiting.  Endocrine: Negative for cold intolerance, heat intolerance, polydipsia, polyphagia and polyuria.  Genitourinary: Negative for dysuria, flank pain, frequency, hematuria, impotence, menorrhagia, menstrual problem, pelvic pain, urgency, vaginal bleeding and vaginal discharge.  Musculoskeletal: Negative for arthralgias,  back pain, myalgias, muscle weakness and neck pain.  Skin: Negative for pallor and rash.  Allergic/Immunologic: Negative for environmental allergies and food allergies.  Neurological: Positive for headaches. Negative for dizziness, focal weakness, weakness, light-headedness, numbness and paresthesias.  Hematological: Negative for adenopathy. Does not bruise/bleed easily.  Psychiatric/Behavioral: Negative for confusion, decreased concentration and dysphoric mood. The patient is nervous/anxious. The patient does not have insomnia.     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     Vicodin [Hydrocodone-Acetaminophen] Other (See Comments)    Heart raced.    Past Surgical History:  Procedure Laterality Date   APPENDECTOMY  2008   CARDIAC CATHETERIZATION N/A  09/12/2015   Procedure: Left Heart Cath and Coronary Angiography;  Surgeon: Isaias Cowman, MD;  Location: [facility];  Service: Cardiovascular;  Laterality: N/A;   ESOPHAGOGASTRODUODENOSCOPY (EGD) WITH PROPOFOL N/A 04/18/2015   Procedure: ESOPHAGOGASTRODUODENOSCOPY (EGD) WITH PROPOFOL;  Surgeon: Lucilla Lame, MD;  Location: [facility];  Service: Endoscopy;  Laterality: N/A;   HERNIA REPAIR      Social History   Tobacco Use   Smoking status: Never Smoker   Smokeless tobacco: Never Used  Substance Use Topics   Alcohol use: No    Alcohol/week: 0.0 standard drinks   Drug use: No     Medication list has been reviewed and updated.  Current Meds  Medication Sig   ALPRAZolam (XANAX) 0.5 MG tablet TAKE ONE TABLET BY MOUTH ONCE DAILY AS NEEDED   B Complex-C (SUPER B COMPLEX PO) Take 1 tablet by mouth daily.   ferrous sulfate 325 (65 FE) MG tablet Take 1 tablet (325 mg total) by mouth daily with breakfast.   hydrochlorothiazide (HYDRODIURIL) 12.5 MG tablet Take 1 tablet (12.5 mg total) by mouth daily.   ibuprofen (ADVIL,MOTRIN) 200 MG tablet Take 200 mg by mouth every 6 (six) hours as needed.   Magnesium 250 MG TABS Take 1 tablet by mouth daily.   metoprolol tartrate (LOPRESSOR) 25 MG tablet TAKE 1/2 (ONE-HALF) TABLET BY MOUTH TWICE DAILY   omeprazole (PRILOSEC) 40 MG capsule Take 1 capsule by mouth twice daily   simvastatin (ZOCOR) 40 MG tablet Take 1 tablet (40 mg total) by mouth daily.   [DISCONTINUED] metaxalone (SKELAXIN) 800 MG tablet Take 1 tablet (800 mg total) by mouth 3 (three) times daily.    PHQ 2/9 Scores 11/25/2016 05/05/2015  PHQ - 2 Score 0 0  PHQ- 9 Score 2 -    BP Readings from Last 3 Encounters:  09/20/18 110/80  04/14/18 (!) 149/78  02/24/18 100/64    Physical Exam Vitals signs and nursing note reviewed.  Constitutional:      Appearance: She is well-developed.  HENT:     Head: Normocephalic.     Right Ear: External ear  normal.     Left Ear: External ear normal.  Eyes:     General: Lids are everted, no foreign bodies appreciated. No scleral icterus.       Left eye: No foreign body or hordeolum.     Conjunctiva/sclera: Conjunctivae normal.     Right eye: Right conjunctiva is not injected.     Left eye: Left conjunctiva is not injected.     Pupils: Pupils are equal, round, and reactive to light.  Neck:     Musculoskeletal: Normal range of motion and neck supple.     Thyroid: No thyromegaly.     Vascular: No JVD.     Trachea: No tracheal deviation.  Cardiovascular:     Rate and Rhythm: Normal rate and regular rhythm.     Heart sounds: Normal heart sounds. No murmur. No friction rub. No gallop.   Pulmonary:     Effort: Pulmonary effort is normal. No respiratory distress.     Breath sounds: Normal breath sounds. No wheezing or rales.  Abdominal:     General: Bowel sounds are normal.     Palpations: Abdomen is soft. There is no mass.     Tenderness: There is no abdominal tenderness. There is no guarding or rebound.  Musculoskeletal: Normal range of motion.        General: No tenderness.  Lymphadenopathy:     Cervical: No cervical adenopathy.  Skin:    General: Skin is warm.     Findings: No rash.  Neurological:     Mental Status: She is alert and oriented to person, place, and time.     Cranial Nerves: No cranial nerve deficit.     Deep Tendon Reflexes: Reflexes normal.  Psychiatric:        Mood and Affect: Mood is not anxious or depressed.     Wt Readings from Last 3 Encounters:  09/20/18 221 lb (100.2 kg)  04/14/18 205 lb (93 kg)  02/24/18 222 lb (100.7 kg)    BP 110/80    Pulse 64    Ht 5\' 11"  (1.803 m)    Wt 221 lb (100.2 kg)    BMI 30.82 kg/m   Assessment and Plan:  1. Essential (primary) hypertension Chronic.  Controlled.  Continue metoprolol 25 mg 1/2 tablet twice a day hydrochlorothiazide 12.5 mg once a day will check renal function panel. - Renal function panel - metoprolol  tartrate (LOPRESSOR) 25 MG tablet; TAKE 1/2 (ONE-HALF) TABLET BY MOUTH TWICE DAILY  Dispense: 90 tablet; Refill: 1 - hydrochlorothiazide (HYDRODIURIL) 12.5 MG tablet; Take 1 tablet (12.5 mg total) by mouth daily.  Dispense: 90 tablet; Refill: 1 - simvastatin (ZOCOR) 40 MG tablet; Take 1 tablet (40 mg total) by mouth daily.  Dispense: 90 tablet; Refill: 1  2. Gastroesophageal reflux disease, esophagitis presence not specified Chronic.  Controlled.  Continue olmesartan 40 mg once a day. - omeprazole (PRILOSEC) 40 MG capsule; Take 1 capsule (40 mg total) by mouth 2 (two) times daily.  Dispense: 180 capsule; Refill: 1  3. Taking medication for chronic disease Patient on statin for cholesterol control and will check hepatic function panel for evaluation of possible hepatotoxicity. - Hepatic Function Panel (6)  4. Mixed hyperlipidemia Chronic.  Controlled.  Continue simvastatin 40 mg once a day.  Will check lipid panel. - Lipid Panel With LDL/HDL Ratio  5. Iron deficiency anemia, unspecified iron deficiency anemia type Patient has a history of iron deficiency anemia we will check hemoglobin for evaluation and continue ferrous  sulfate 325 mg once a day. - Hemoglobin  6. Abnormal ultrasound of neck Patient had some upper limits of normal lymphadenopathy at the cervical region that was noted on cervical ultrasound on 11/09/2017.  Patient will be calling in 6 weeks about rescheduling for a cervical ultrasound and rechecking the status of the lymph nodes.

## 2018-09-21 LAB — HEMOGLOBIN: Hemoglobin: 14 g/dL (ref 11.1–15.9)

## 2018-09-21 LAB — HEPATIC FUNCTION PANEL (6)
ALT: 32 IU/L (ref 0–32)
AST: 27 IU/L (ref 0–40)
Alkaline Phosphatase: 101 IU/L (ref 39–117)
Bilirubin Total: 0.6 mg/dL (ref 0.0–1.2)
Bilirubin, Direct: 0.13 mg/dL (ref 0.00–0.40)

## 2018-09-21 LAB — RENAL FUNCTION PANEL
Albumin: 4.7 g/dL (ref 3.8–4.9)
BUN/Creatinine Ratio: 16 (ref 9–23)
BUN: 13 mg/dL (ref 6–24)
CO2: 21 mmol/L (ref 20–29)
Calcium: 10 mg/dL (ref 8.7–10.2)
Chloride: 102 mmol/L (ref 96–106)
Creatinine, Ser: 0.81 mg/dL (ref 0.57–1.00)
GFR calc Af Amer: 95 mL/min/{1.73_m2} (ref >59)
GFR calc non Af Amer: 82 mL/min/{1.73_m2} (ref >59)
Glucose: 94 mg/dL (ref 65–99)
Phosphorus: 4.5 mg/dL — ABNORMAL HIGH (ref 3.0–4.3)
Potassium: 4.7 mmol/L (ref 3.5–5.2)
Sodium: 143 mmol/L (ref 134–144)

## 2018-09-21 LAB — LIPID PANEL WITH LDL/HDL RATIO
Cholesterol, Total: 233 mg/dL — ABNORMAL HIGH (ref 100–199)
HDL: 50 mg/dL (ref >39)
LDL Calculated: 132 mg/dL — ABNORMAL HIGH (ref 0–99)
LDl/HDL Ratio: 2.6 ratio (ref 0.0–3.2)
Triglycerides: 253 mg/dL — ABNORMAL HIGH (ref 0–149)
VLDL Cholesterol Cal: 51 mg/dL — ABNORMAL HIGH (ref 5–40)

## 2018-11-10 ENCOUNTER — Telehealth: Payer: Self-pay

## 2018-11-10 NOTE — Telephone Encounter (Signed)
Was asked to call back to schedule next Thyroid U/S as 1 year f/u on nodules. Advised once ordered that scheduling will call her and set this up. Would prefer after 3 PM if that can be noted in the order.

## 2018-11-13 NOTE — Telephone Encounter (Signed)
Baxter Flattery called her

## 2018-11-23 LAB — HM PAP SMEAR

## 2019-02-25 ENCOUNTER — Other Ambulatory Visit: Payer: Self-pay | Admitting: Family Medicine

## 2019-02-25 DIAGNOSIS — K219 Gastro-esophageal reflux disease without esophagitis: Secondary | ICD-10-CM

## 2019-02-27 ENCOUNTER — Ambulatory Visit: Payer: BC Managed Care – PPO | Admitting: Family Medicine

## 2019-04-27 ENCOUNTER — Ambulatory Visit: Payer: BC Managed Care – PPO | Admitting: Family Medicine

## 2019-05-17 ENCOUNTER — Ambulatory Visit: Payer: BC Managed Care – PPO | Admitting: Family Medicine

## 2019-05-17 ENCOUNTER — Encounter: Payer: Self-pay | Admitting: Family Medicine

## 2019-05-17 ENCOUNTER — Other Ambulatory Visit: Payer: Self-pay

## 2019-05-17 VITALS — BP 130/80 | HR 80 | Ht 71.0 in | Wt 224.0 lb

## 2019-05-17 DIAGNOSIS — D509 Iron deficiency anemia, unspecified: Secondary | ICD-10-CM

## 2019-05-17 DIAGNOSIS — K219 Gastro-esophageal reflux disease without esophagitis: Secondary | ICD-10-CM

## 2019-05-17 DIAGNOSIS — F064 Anxiety disorder due to known physiological condition: Secondary | ICD-10-CM | POA: Diagnosis not present

## 2019-05-17 DIAGNOSIS — I10 Essential (primary) hypertension: Secondary | ICD-10-CM

## 2019-05-17 DIAGNOSIS — E7849 Other hyperlipidemia: Secondary | ICD-10-CM

## 2019-05-17 MED ORDER — SIMVASTATIN 40 MG PO TABS: 40.0000 mg | ORAL_TABLET | Freq: Every day | ORAL | 1 refills | Status: DC

## 2019-05-17 MED ORDER — OMEPRAZOLE 40 MG PO CPDR: DELAYED_RELEASE_CAPSULE | ORAL | 1 refills | Status: DC

## 2019-05-17 MED ORDER — HYDROCHLOROTHIAZIDE 12.5 MG PO TABS: 12.5000 mg | ORAL_TABLET | Freq: Every day | ORAL | 1 refills | Status: DC

## 2019-05-17 MED ORDER — METOPROLOL TARTRATE 25 MG PO TABS: ORAL_TABLET | ORAL | 1 refills | Status: DC

## 2019-05-17 MED ORDER — FERROUS SULFATE 325 (65 FE) MG PO TABS: 325.0000 mg | ORAL_TABLET | Freq: Every day | ORAL | 1 refills | Status: DC

## 2019-05-17 MED ORDER — ALPRAZOLAM 0.5 MG PO TABS: ORAL_TABLET | ORAL | 5 refills | Status: DC

## 2019-05-17 NOTE — Patient Instructions (Signed)

## 2019-05-17 NOTE — Progress Notes (Signed)
Date:  05/17/2019   Name:  Sherri Matthews   DOB:  [birth year]   MRN:  [id number]   Chief Complaint: Hypertension, Hyperlipidemia, Anemia (has been taken off of iron supplement), Gastroesophageal Reflux, and Anxiety  Hypertension This is a chronic problem. The current episode started more than 1 year ago. The problem has been gradually improving since onset. The problem is uncontrolled. Associated symptoms include anxiety. Pertinent negatives include no blurred vision, chest pain, headaches, malaise/fatigue, neck pain, orthopnea, palpitations, peripheral edema, PND, shortness of breath or sweats. There are no associated agents to hypertension. There are no known risk factors for coronary artery disease. Past treatments include diuretics and beta blockers. The current treatment provides moderate improvement. There are no compliance problems.  There is no history of angina, kidney disease, CAD/MI, CVA, heart failure, left ventricular hypertrophy, PVD or retinopathy. There is no history of chronic renal disease, a hypertension causing med or renovascular disease.  Hyperlipidemia This is a chronic problem. The current episode started more than 1 year ago. The problem is controlled. She has no history of chronic renal disease, diabetes, hypothyroidism, liver disease, obesity or nephrotic syndrome. Factors aggravating her hyperlipidemia include thiazides. Pertinent negatives include no chest pain, focal sensory loss, focal weakness, leg pain, myalgias or shortness of breath. Current antihyperlipidemic treatment includes statins. The current treatment provides moderate improvement of lipids. There are no compliance problems.  There are no known risk factors for coronary artery disease.  Anemia Presents for follow-up visit. There has been no abdominal pain, anorexia, bruising/bleeding easily, confusion, fever, leg swelling, light-headedness, malaise/fatigue, pallor, palpitations, paresthesias, pica or  weight loss. Signs of blood loss that are not present include hematemesis, hematochezia, melena, menorrhagia and vaginal bleeding. There is no history of chronic renal disease, heart failure or hypothyroidism. There are no compliance problems.  Side effects of medications include fatigue.  Gastroesophageal Reflux She reports no abdominal pain, no belching, no chest pain, no choking, no coughing, no dysphagia, no early satiety, no globus sensation, no heartburn, no hoarse voice, no nausea, no sore throat, no stridor, no tooth decay, no water brash or no wheezing. This is a chronic problem. The current episode started more than 1 year ago. The problem has been waxing and waning. Associated symptoms include anemia and fatigue. Pertinent negatives include no melena, muscle weakness, orthopnea or weight loss. Risk factors include obesity. She has tried a PPI for the symptoms. The treatment provided significant relief.  Anxiety Presents for follow-up visit. Symptoms include decreased concentration, depressed mood, excessive worry and nervous/anxious behavior. Patient reports no chest pain, confusion, dizziness, nausea, palpitations or shortness of breath. The severity of symptoms is mild.   Her past medical history is significant for anemia.    Lab Results  Component Value Date   CREATININE 0.81 09/20/2018   BUN 13 09/20/2018   NA 143 09/20/2018   K 4.7 09/20/2018   CL 102 09/20/2018   CO2 21 09/20/2018   Lab Results  Component Value Date   CHOL 233 (H) 09/20/2018   HDL 50 09/20/2018   LDLCALC 132 (H) 09/20/2018   TRIG 253 (H) 09/20/2018   CHOLHDL 4.6 (H) 02/24/2018   Lab Results  Component Value Date   TSH 0.910 11/01/2017   No results found for: HGBA1C Lab Results  Component Value Date   WBC 6.3 11/01/2017   HGB 14.0 09/20/2018   HCT 36.5 11/01/2017   MCV 87 11/01/2017   PLT 223 11/01/2017   Lab Results  Component Value Date   ALT 32 09/20/2018   AST 27 09/20/2018   ALKPHOS 101  09/20/2018   BILITOT 0.6 09/20/2018     Review of Systems  Constitutional: Positive for fatigue. Negative for chills, fever, malaise/fatigue, unexpected weight change and weight loss.  HENT: Negative for congestion, ear discharge, ear pain, hoarse voice, rhinorrhea, sinus pressure, sneezing and sore throat.   Eyes: Negative for blurred vision, photophobia, pain, discharge, redness and itching.  Respiratory: Negative for cough, choking, shortness of breath, wheezing and stridor.   Cardiovascular: Negative for chest pain, palpitations, orthopnea and PND.  Gastrointestinal: Negative for abdominal pain, anorexia, blood in stool, constipation, diarrhea, dysphagia, heartburn, hematemesis, hematochezia, melena, nausea and vomiting.  Endocrine: Negative for cold intolerance, heat intolerance, polydipsia, polyphagia and polyuria.  Genitourinary: Negative for dysuria, flank pain, frequency, hematuria, menorrhagia, menstrual problem, pelvic pain, urgency, vaginal bleeding and vaginal discharge.  Musculoskeletal: Negative for arthralgias, back pain, myalgias, muscle weakness and neck pain.  Skin: Negative for pallor and rash.  Allergic/Immunologic: Negative for environmental allergies and food allergies.  Neurological: Negative for dizziness, focal weakness, weakness, light-headedness, numbness, headaches and paresthesias.  Hematological: Negative for adenopathy. Does not bruise/bleed easily.  Psychiatric/Behavioral: Positive for decreased concentration. Negative for confusion and dysphoric mood. The patient is nervous/anxious.     Patient Active Problem List   Diagnosis Date Noted  . Chest pain 06/19/2016  . Abnormal EKG 06/19/2016  . Menopause 01/28/2016  . Essential hypertension 05/15/2015  . Hyperlipidemia 05/15/2015  . Gastritis   . 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 DDD (degenerative disc  disease), lumbosacral 07/16/2014  . Heart murmur 07/16/2014  . Gastroesophageal reflux disease 07/16/2014  . H/O: osteoarthritis 07/16/2014    Allergies  Allergen Reactions  . Oxycodone-Acetaminophen Itching  . Oxycodone-Acetaminophen Itching  . Ciprofloxacin Nausea And Vomiting  . Dilaudid [Hydromorphone Hcl]   . Fenofibrate     myalgia  . Sertraline Other (See Comments)    Pt prefers to never be on this again.    . Vicodin [Hydrocodone-Acetaminophen] Other (See Comments)    Heart raced.    Past Surgical History:  Procedure Laterality Date  . APPENDECTOMY  2008  . CARDIAC CATHETERIZATION N/A 09/12/2015   Procedure: Left Heart Cath and Coronary Angiography;  Surgeon: Marcina Millard, MD;  Location: [facility];  Service: Cardiovascular;  Laterality: N/A;  . ESOPHAGOGASTRODUODENOSCOPY (EGD) WITH PROPOFOL N/A 04/18/2015   Procedure: ESOPHAGOGASTRODUODENOSCOPY (EGD) WITH PROPOFOL;  Surgeon: Midge Minium, MD;  Location: [facility];  Service: Endoscopy;  Laterality: N/A;  . HERNIA REPAIR      Social History   Tobacco Use  . Smoking status: Never Smoker  . Smokeless tobacco: Never Used  Substance Use Topics  . Alcohol use: No    Alcohol/week: 0.0 standard drinks  . Drug use: No     Medication list has been reviewed and updated.  Current Meds  Medication Sig  . ALPRAZolam (XANAX) 0.5 MG tablet TAKE ONE TABLET BY MOUTH ONCE DAILY AS NEEDED  . Ascorbic Acid (VITAMIN C) 100 MG CHEW Chew 1 tablet by mouth daily.  . B Complex-C (SUPER B COMPLEX PO) Take 1 tablet by mouth daily.  . hydrochlorothiazide (HYDRODIURIL) 12.5 MG tablet Take 1 tablet (12.5 mg total) by mouth daily.  Marland Kitchen ibuprofen (ADVIL,MOTRIN) 200 MG tablet Take 200 mg by mouth every 6 (six) hours as needed.  . Magnesium 250 MG TABS Take 1 tablet by  mouth daily.  . metoprolol tartrate (LOPRESSOR) 25 MG tablet TAKE 1/2 (ONE-HALF) TABLET BY MOUTH TWICE DAILY  . Nutritional Supplements (VITAMIN D  BOOSTER PO) Take 1 capsule by mouth daily.  Marland Kitchen omeprazole (PRILOSEC) 40 MG capsule TAKE 1 CAPSULE BY MOUTH TWICE DAILY. NEED APPOINTMENT FOR FURTHER REFILLS.  Marland Kitchen simvastatin (ZOCOR) 40 MG tablet Take 1 tablet (40 mg total) by mouth daily.    PHQ 2/9 Scores 05/17/2019 11/25/2016 05/05/2015  PHQ - 2 Score 0 0 0  PHQ- 9 Score 8 2 -    BP Readings from Last 3 Encounters:  05/17/19 130/80  09/20/18 110/80  04/14/18 (!) 149/78    Physical Exam Vitals and nursing note reviewed.  Constitutional:      Appearance: She is well-developed.  HENT:     Head: Normocephalic.     Right Ear: Tympanic membrane, ear canal and external ear normal.     Left Ear: Tympanic membrane, ear canal and external ear normal.     Nose: Nose normal.     Mouth/Throat:     Mouth: Mucous membranes are moist.  Eyes:     General: Lids are everted, no foreign bodies appreciated. No scleral icterus.       Left eye: No foreign body or hordeolum.     Conjunctiva/sclera: Conjunctivae normal.     Right eye: Right conjunctiva is not injected.     Left eye: Left conjunctiva is not injected.     Pupils: Pupils are equal, round, and reactive to light.  Neck:     Thyroid: No thyromegaly.     Vascular: No JVD.     Trachea: No tracheal deviation.  Cardiovascular:     Rate and Rhythm: Normal rate and regular rhythm.     Heart sounds: Normal heart sounds. No murmur. No friction rub. No gallop.   Pulmonary:     Effort: Pulmonary effort is normal. No respiratory distress.     Breath sounds: Normal breath sounds. No wheezing, rhonchi or rales.  Abdominal:     General: Bowel sounds are normal.     Palpations: Abdomen is soft. There is no mass.     Tenderness: There is no abdominal tenderness. There is no guarding or rebound.  Musculoskeletal:        General: No tenderness. Normal range of motion.     Cervical back: Normal range of motion and neck supple.  Lymphadenopathy:     Cervical: No cervical adenopathy.  Skin:     General: Skin is warm.     Findings: No rash.  Neurological:     Mental Status: She is alert and oriented to person, place, and time.     Cranial Nerves: No cranial nerve deficit.     Deep Tendon Reflexes: Reflexes normal.  Psychiatric:        Mood and Affect: Mood is not anxious or depressed.     Wt Readings from Last 3 Encounters:  05/17/19 224 lb (101.6 kg)  09/20/18 221 lb (100.2 kg)  04/14/18 205 lb (93 kg)    BP 130/80   Pulse 80   Ht 5\' 11"  (1.803 m)   Wt 224 lb (101.6 kg)   BMI 31.24 kg/m   Assessment and Plan: 1. Essential (primary) hypertension Chronic.  Controlled.  Stable.  Continue metoprolol 25 mg 1/2 tablet twice a day.  Will check CMP.  We will recheck patient in 6 months. - Comprehensive metabolic panel - metoprolol tartrate (LOPRESSOR) 25 MG tablet; TAKE 1/2 (ONE-HALF)  TABLET BY MOUTH TWICE DAILY  Dispense: 90 tablet; Refill: 1 - ALPRAZolam (XANAX) 0.5 MG tablet; TAKE ONE TABLET BY MOUTH ONCE DAILY AS NEEDED  Dispense: 30 tablet; Refill: 5 - hydrochlorothiazide (HYDRODIURIL) 12.5 MG tablet; Take 1 tablet (12.5 mg total) by mouth daily.  Dispense: 90 tablet; Refill: 1  2. 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  Controlled.  Episodic.  Patient may take alprazolam 0.5 tablet once a day as needed as needed - ALPRAZolam (XANAX) 0.5 MG tablet; TAKE ONE TABLET BY MOUTH ONCE DAILY AS NEEDED  Dispense: 30 tablet; Refill: 5  3. Gastroesophageal reflux disease Chronic.  Controlled.  Stable.  Continue omeprazole 40 mg 1 twice a day as needed. - omeprazole (PRILOSEC) 40 MG capsule; TAKE 1 CAPSULE BY MOUTH TWICE DAILY. NEED APPOINTMENT FOR FURTHER REFILLS.  Dispense: 180 capsule; Refill: 1  4. Iron deficiency anemia, unspecified iron deficiency anemia type .  Controlled.  Stable.  Patient is not taking ferrous sulfate 325 but patient was given this printed we will check a CBC and if anemic patient will resume ferrous sulfate. - CBC with  Differential/Platelet - ferrous sulfate 325 (65 FE) MG tablet; Take 1 tablet (325 mg total) by mouth daily with breakfast.  Dispense: 90 tablet; Refill: 1  5. Familial multiple lipoprotein-type hyperlipidemia Chronic.  Controlled.  Stable.  Continue simvastatin 40 mg once a day.  Will check lipid panel for evaluation of LDL ratio. - Lipid Panel With LDL/HDL Ratio - simvastatin (ZOCOR) 40 MG tablet; Take 1 tablet (40 mg total) by mouth daily.  Dispense: 90 tablet; Refill: 1

## 2019-05-18 LAB — CBC WITH DIFFERENTIAL/PLATELET
Basophils Absolute: 0 10*3/uL (ref 0.0–0.2)
Basos: 1 %
EOS (ABSOLUTE): 0.1 10*3/uL (ref 0.0–0.4)
Eos: 1 %
Hematocrit: 38.4 % (ref 34.0–46.6)
Hemoglobin: 12.9 g/dL (ref 11.1–15.9)
Immature Grans (Abs): 0 10*3/uL (ref 0.0–0.1)
Immature Granulocytes: 0 %
Lymphocytes Absolute: 1.6 10*3/uL (ref 0.7–3.1)
Lymphs: 31 %
MCH: 28.7 pg (ref 26.6–33.0)
MCHC: 33.6 g/dL (ref 31.5–35.7)
MCV: 86 fL (ref 79–97)
Monocytes Absolute: 0.5 10*3/uL (ref 0.1–0.9)
Monocytes: 9 %
Neutrophils Absolute: 3.1 10*3/uL (ref 1.4–7.0)
Neutrophils: 58 %
Platelets: 225 10*3/uL (ref 150–450)
RBC: 4.49 x10E6/uL (ref 3.77–5.28)
RDW: 13 % (ref 11.7–15.4)
WBC: 5.3 10*3/uL (ref 3.4–10.8)

## 2019-05-18 LAB — COMPREHENSIVE METABOLIC PANEL
ALT: 30 IU/L (ref 0–32)
AST: 30 IU/L (ref 0–40)
Albumin/Globulin Ratio: 1.7 (ref 1.2–2.2)
Albumin: 4.3 g/dL (ref 3.8–4.9)
Alkaline Phosphatase: 112 IU/L (ref 39–117)
BUN/Creatinine Ratio: 20 (ref 9–23)
BUN: 16 mg/dL (ref 6–24)
Bilirubin Total: 0.8 mg/dL (ref 0.0–1.2)
CO2: 25 mmol/L (ref 20–29)
Calcium: 9.5 mg/dL (ref 8.7–10.2)
Chloride: 104 mmol/L (ref 96–106)
Creatinine, Ser: 0.82 mg/dL (ref 0.57–1.00)
GFR calc Af Amer: 93 mL/min/{1.73_m2} (ref >59)
GFR calc non Af Amer: 80 mL/min/{1.73_m2} (ref >59)
Globulin, Total: 2.5 g/dL (ref 1.5–4.5)
Glucose: 100 mg/dL — ABNORMAL HIGH (ref 65–99)
Potassium: 4.3 mmol/L (ref 3.5–5.2)
Sodium: 142 mmol/L (ref 134–144)
Total Protein: 6.8 g/dL (ref 6.0–8.5)

## 2019-05-18 LAB — LIPID PANEL WITH LDL/HDL RATIO
Cholesterol, Total: 195 mg/dL (ref 100–199)
HDL: 44 mg/dL (ref >39)
LDL Chol Calc (NIH): 121 mg/dL — ABNORMAL HIGH (ref 0–99)
LDL/HDL Ratio: 2.8 ratio (ref 0.0–3.2)
Triglycerides: 170 mg/dL — ABNORMAL HIGH (ref 0–149)
VLDL Cholesterol Cal: 30 mg/dL (ref 5–40)

## 2019-08-02 ENCOUNTER — Other Ambulatory Visit: Payer: Self-pay | Admitting: Family Medicine

## 2019-08-02 DIAGNOSIS — Z1231 Encounter for screening mammogram for malignant neoplasm of breast: Secondary | ICD-10-CM

## 2019-08-08 ENCOUNTER — Ambulatory Visit
Admission: RE | Admit: 2019-08-08 | Discharge: 2019-08-08 | Disposition: A | Discharge status: Home / Self Care | Payer: BC Managed Care – PPO | Source: Ambulatory Visit | Attending: Family Medicine | Admitting: Family Medicine

## 2019-08-08 ENCOUNTER — Other Ambulatory Visit: Payer: Self-pay

## 2019-08-08 DIAGNOSIS — Z1231 Encounter for screening mammogram for malignant neoplasm of breast: Secondary | ICD-10-CM | POA: Diagnosis present

## 2019-08-09 ENCOUNTER — Other Ambulatory Visit: Payer: Self-pay | Admitting: Family Medicine

## 2019-08-09 DIAGNOSIS — N6320 Unspecified lump in the left breast, unspecified quadrant: Secondary | ICD-10-CM

## 2019-08-09 DIAGNOSIS — R928 Other abnormal and inconclusive findings on diagnostic imaging of breast: Secondary | ICD-10-CM

## 2019-08-16 ENCOUNTER — Ambulatory Visit
Admission: RE | Admit: 2019-08-16 | Discharge: 2019-08-16 | Disposition: A | Discharge status: Home / Self Care | Payer: BC Managed Care – PPO | Source: Ambulatory Visit | Attending: Family Medicine | Admitting: Family Medicine

## 2019-08-16 DIAGNOSIS — N6320 Unspecified lump in the left breast, unspecified quadrant: Secondary | ICD-10-CM | POA: Insufficient documentation

## 2019-08-16 DIAGNOSIS — R928 Other abnormal and inconclusive findings on diagnostic imaging of breast: Secondary | ICD-10-CM

## 2019-08-25 ENCOUNTER — Ambulatory Visit
Admission: RE | Admit: 2019-08-25 | Discharge: 2019-08-25 | Disposition: A | Discharge status: Home / Self Care | Payer: BC Managed Care – PPO | Source: Ambulatory Visit | Attending: Emergency Medicine | Admitting: Emergency Medicine

## 2019-08-25 ENCOUNTER — Other Ambulatory Visit: Payer: Self-pay

## 2019-08-25 ENCOUNTER — Ambulatory Visit (INDEPENDENT_AMBULATORY_CARE_PROVIDER_SITE_OTHER): Payer: BC Managed Care – PPO

## 2019-08-25 VITALS — BP 128/81 | HR 66 | Temp 99.7°F | Resp 14 | Ht 71.0 in | Wt 210.0 lb

## 2019-08-25 DIAGNOSIS — R5383 Other fatigue: Secondary | ICD-10-CM | POA: Insufficient documentation

## 2019-08-25 DIAGNOSIS — N3090 Cystitis, unspecified without hematuria: Secondary | ICD-10-CM | POA: Insufficient documentation

## 2019-08-25 DIAGNOSIS — Z79899 Other long term (current) drug therapy: Secondary | ICD-10-CM | POA: Insufficient documentation

## 2019-08-25 DIAGNOSIS — I10 Essential (primary) hypertension: Secondary | ICD-10-CM | POA: Diagnosis not present

## 2019-08-25 DIAGNOSIS — J309 Allergic rhinitis, unspecified: Secondary | ICD-10-CM | POA: Diagnosis not present

## 2019-08-25 DIAGNOSIS — R0789 Other chest pain: Secondary | ICD-10-CM | POA: Diagnosis not present

## 2019-08-25 DIAGNOSIS — U071 COVID-19: Secondary | ICD-10-CM | POA: Insufficient documentation

## 2019-08-25 DIAGNOSIS — E7849 Other hyperlipidemia: Secondary | ICD-10-CM | POA: Insufficient documentation

## 2019-08-25 DIAGNOSIS — R05 Cough: Secondary | ICD-10-CM | POA: Diagnosis not present

## 2019-08-25 DIAGNOSIS — M5137 Other intervertebral disc degeneration, lumbosacral region: Secondary | ICD-10-CM | POA: Diagnosis not present

## 2019-08-25 DIAGNOSIS — R0602 Shortness of breath: Secondary | ICD-10-CM | POA: Insufficient documentation

## 2019-08-25 DIAGNOSIS — K589 Irritable bowel syndrome without diarrhea: Secondary | ICD-10-CM | POA: Diagnosis not present

## 2019-08-25 DIAGNOSIS — K219 Gastro-esophageal reflux disease without esophagitis: Secondary | ICD-10-CM | POA: Insufficient documentation

## 2019-08-25 DIAGNOSIS — I341 Nonrheumatic mitral (valve) prolapse: Secondary | ICD-10-CM | POA: Insufficient documentation

## 2019-08-25 DIAGNOSIS — J029 Acute pharyngitis, unspecified: Secondary | ICD-10-CM | POA: Insufficient documentation

## 2019-08-25 DIAGNOSIS — F419 Anxiety disorder, unspecified: Secondary | ICD-10-CM | POA: Diagnosis not present

## 2019-08-25 LAB — URINALYSIS, COMPLETE (UACMP) WITH MICROSCOPIC
Glucose, UA: NEGATIVE mg/dL
Hgb urine dipstick: NEGATIVE
Ketones, ur: 15 mg/dL — AB
Leukocytes,Ua: NEGATIVE
Nitrite: NEGATIVE
Protein, ur: 30 mg/dL — AB
RBC / HPF: NONE SEEN RBC/hpf (ref 0–5)
Specific Gravity, Urine: >1.03 — ABNORMAL HIGH (ref 1.005–1.030)
pH: 5 (ref 5.0–8.0)

## 2019-08-25 LAB — GROUP A STREP BY PCR: Group A Strep by PCR: NOT DETECTED

## 2019-08-25 LAB — SARS CORONAVIRUS 2 (TAT 6-24 HRS): SARS Coronavirus 2: POSITIVE — AB

## 2019-08-25 MED ORDER — PREDNISONE 10 MG (21) PO TBPK: ORAL_TABLET | ORAL | 0 refills | Status: DC

## 2019-08-25 NOTE — Discharge Instructions (Addendum)
You were seen for cough and shortness of breath and are being treated for allergies.   Take over-the-counter allergy medication such as cetirizine (Zyrtec), fexofenadine (Allegra), or levocetirizine (Xyzal).  Over-the-counter Flonase would also help as well.  Take your steroids as prescribed.  We are sending your urine out for a culture. If we need to add or change any medications, our nurse will give you a call to let you know.  ---- Take care, Dr. Sharlet Salina, NP-c

## 2019-08-25 NOTE — ED Triage Notes (Signed)
Patient c/o cough, tightness in chest and SOB that started Thursday.  Patient reports low grade fever that started yesterday.

## 2019-08-25 NOTE — ED Provider Notes (Signed)
[facility] [place]   Name: Sherri Matthews DOB: 13-Apr-1965 MRN: [id number] CSN: [id number] PCP: Duanne LimerickJones, Deanna C, MD  Arrival date and time:  08/25/19 1445  Chief Complaint:  Cough and Shortness of Breath   NOTE: Prior to seeing the patient today, I have reviewed the triage nursing documentation and vital signs. Clinical staff has updated patient's PMH/PSHx, current medication list, and drug allergies/intolerances to ensure comprehensive history available to assist in medical decision making.   History:   HPI: Sherri Matthews is a 54 y.o. female who presents today with complaints of cough, chest tightness and sore throat.  The symptoms started on Thursday and have progressively getting worse.  She has not treated her symptoms with any over-the-counter medications due to her sensitivity to many over-the-counter cold medications.  She currently does not take any allergy medications.  She states he has a low-grade fever, temperature rising to approximately 100.1, and increased feelings of fatigue.  She denies any chest pain or pressure, problem or pain breathing, or productive cough.   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  She denies any known exposure to COVID-19.  She is not vaccinated against COVID-19.  She is also complaining of abdominal pain.  She states his abdominal pain started a couple days ago.  The pain is centralized to her left lower abdomen.  She denies any nausea, vomiting, or urinary symptoms.  Past Medical History:  Diagnosis Date   Anxiety    AV node dysfunction    tachycardia sees Dr. Lady GaryFath   Cervical disc disease    degenerative disk    Chronic abdominal pain    Heart murmur    Hyperlipidemia    Hypertension    IBS (irritable bowel syndrome)    Mitral valve prolapse    Palpitations    Sleep apnea    possible but not tested    Past Surgical History:  Procedure Laterality Date    APPENDECTOMY  2008   CARDIAC CATHETERIZATION N/A 09/12/2015   Procedure: Left Heart Cath and Coronary Angiography;  Surgeon: Marcina MillardAlexander Paraschos, MD;  Location: [facility] IN[facility];  Service: Cardiovascular;  Laterality: N/A;   ESOPHAGOGASTRODUODENOSCOPY (EGD) WITH PROPOFOL N/A 04/18/2015   Procedure: ESOPHAGOGASTRODUODENOSCOPY (EGD) WITH PROPOFOL;  Surgeon: Midge Miniumarren Wohl, MD;  [facility];  Service: Endoscopy;  Laterality: N/A;   HERNIA REPAIR      Family History  Problem Relation Age of Onset   Atrial fibrillation Mother    Hypertension Mother    Diabetes Mother    Atrial fibrillation Father    Breast cancer Maternal Aunt        mat great aunt    Social History   Tobacco Use   Smoking status: Never Smoker   Smokeless tobacco: Never Used  Vaping Use   Vaping Use: Never used  Substance Use Topics   Alcohol use: No    Alcohol/week: 0.0 standard drinks   Drug use: No    Patient Active Problem List   Diagnosis Date Noted   Chest pain 06/19/2016   Abnormal EKG 06/19/2016   Menopause 01/28/2016   Essential hypertension 05/15/2015   Hyperlipidemia 05/15/2015   Gastritis    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   B Complex-C (SUPER B COMPLEX PO) Take 1 tablet by mouth daily.   ferrous sulfate 325 (65 FE) MG tablet Take 1 tablet (325 mg total) by mouth daily with breakfast.   hydrochlorothiazide (HYDRODIURIL) 12.5 MG tablet Take 1 tablet (12.5 mg total) by mouth daily.    ibuprofen (ADVIL,MOTRIN) 200 MG tablet Take 200 mg by mouth every 6 (six) hours as needed.   Magnesium 250 MG TABS Take 1 tablet by mouth daily.   metoprolol tartrate (LOPRESSOR) 25 MG tablet TAKE 1/2 (ONE-HALF) TABLET BY MOUTH TWICE DAILY   Nutritional Supplements (VITAMIN D BOOSTER PO) Take 1 capsule by mouth daily.   omeprazole (PRILOSEC) 40 MG capsule TAKE 1 CAPSULE BY MOUTH TWICE DAILY. NEED APPOINTMENT FOR FURTHER REFILLS.   simvastatin (ZOCOR) 40 MG tablet Take 1 tablet (40 mg total) by mouth daily.    Allergies:   Oxycodone-acetaminophen, Oxycodone-acetaminophen, Ciprofloxacin, Dilaudid [hydromorphone hcl], Fenofibrate, Sertraline, and Vicodin [hydrocodone-acetaminophen]  Review of Systems (ROS): Review of Systems  Constitutional: Positive for fatigue and fever.  HENT: Positive for postnasal drip and sore throat. Negative for mouth sores, sinus pressure and sneezing.   Respiratory: Positive for cough, chest tightness and shortness of breath. Negative for wheezing.   Cardiovascular: Negative for chest pain.     Vital Signs: Today's Vitals   08/25/19 1451 08/25/19 1454 08/25/19 1621  BP:  128/81   Pulse:  66   Resp:  14   Temp:  99.7 F (37.6 C)   TempSrc:  Oral   SpO2:  98%   Weight: 210 lb (95.3 kg)    Height: 5\' 11"  (1.803 m)    PainSc: 0-No pain  0-No pain    Physical Exam: Physical Exam Vitals and nursing note reviewed.  Constitutional:      Appearance: Normal appearance.  HENT:     Nose: Nose normal.     Mouth/Throat:     Mouth: Mucous membranes are moist.     Pharynx: Posterior oropharyngeal erythema present.     Tonsils: Tonsillar exudate present.     Comments: Tonsillar stone seen on left tonsil. Eyes:     Pupils: Pupils are equal, round, and reactive to light.  Cardiovascular:     Rate and Rhythm: Normal rate and regular rhythm.     Pulses: Normal pulses.     Heart sounds: Normal heart sounds.  Pulmonary:     Effort: Pulmonary effort is  normal. No accessory muscle usage or respiratory distress.     Breath sounds: Decreased air movement present. Examination of the right-lower field reveals rhonchi. Rhonchi present.  Abdominal:     Palpations: Abdomen is soft.     Tenderness: There is no abdominal tenderness.  Skin:    General: Skin is warm and dry.     Capillary Refill: Capillary refill takes less than 2 seconds.  Neurological:     Mental Status: She is alert.      Urgent Care Treatments / Results:   LABS: PLEASE NOTE: all labs that were ordered this encounter are listed, however only abnormal results are displayed. Labs Reviewed  URINALYSIS, COMPLETE (UACMP) WITH MICROSCOPIC - Abnormal; Notable for the following components:      Result Value   APPearance HAZY (*)  Specific Gravity, Urine >1.030 (*)    Bilirubin Urine SMALL (*)    Ketones, ur 15 (*)    Protein, ur 30 (*)    Bacteria, UA FEW (*)    All other components within normal limits  GROUP A STREP BY PCR  SARS CORONAVIRUS 2 (TAT 6-24 HRS)  URINE CULTURE    EKG: -None  RADIOLOGY: DG Chest 2 View  Result Date: 08/25/2019 CLINICAL DATA:  Cough, chest tightness and shortness of breath. EXAM: CHEST - 2 VIEW COMPARISON:  December 19, 2017 FINDINGS: There is no evidence of acute infiltrate, pleural effusion or pneumothorax. The heart size and mediastinal contours are within normal limits. The visualized skeletal structures are unremarkable. IMPRESSION: No active cardiopulmonary disease. Electronically Signed   By: Aram Candela M.D.   On: 08/25/2019 16:10    PROCEDURES: Procedures  MEDICATIONS RECEIVED THIS VISIT: Medications - No data to display  PERTINENT CLINICAL COURSE NOTES/UPDATES:   Initial Impression / Assessment and Plan / Urgent Care Course:  Pertinent labs & imaging results that were available during my care of the patient were personally reviewed by me and considered in my medical decision making (see lab/imaging section of note for  values and interpretations).  Sherri Matthews is a 54 y.o. female who presents to [facility] today with complaints of cough, sore throat, body aches and left lower abdominal pain, diagnosed with allergies and possible cystitis, and treated as such with the medications below. NP and patient reviewed discharge instructions below during visit.   Patient is well appearing overall in clinic today. She does not appear to be in any acute distress. Presenting symptoms (see HPI) and exam as documented above.   I have reviewed the follow up and strict return precautions for any new or worsening symptoms. Patient is aware of symptoms that would be deemed urgent/emergent, and would thus require further evaluation either here or in the emergency department. At the time of discharge, she verbalized understanding and consent with the discharge plan as it was reviewed with her. All questions were fielded by provider and/or clinic staff prior to patient discharge.    Final Clinical Impressions / Urgent Care Diagnoses:   Final diagnoses:  Allergic rhinitis, unspecified seasonality, unspecified trigger  Cystitis    New Prescriptions:  [place] Controlled Substance Registry consulted? Not Applicable  Meds ordered this encounter  Medications   predniSONE (STERAPRED UNI-PAK 21 TAB) 10 MG (21) TBPK tablet    Sig: Take as instructed on package (60, 50, 40, 30, 20, 10)    Dispense:  1 each    Refill:  0      Discharge Instructions     You were seen for cough and shortness of breath and are being treated for allergies.   Take over-the-counter allergy medication such as cetirizine (Zyrtec), fexofenadine (Allegra), or levocetirizine (Xyzal).  Over-the-counter Flonase would also help as well.  Take your steroids as prescribed.  We are sending your urine out for a culture. If we need to add or change any medications, our nurse will give you a call to let you know.  ---- Take care, Dr. Sharlet Salina,  NP-c     Recommended Follow up Care:  Patient encouraged to follow up with the following provider within the specified time frame, or sooner as dictated by the severity of her symptoms. As always, she was instructed that for any urgent/emergent care needs, she should seek care either here or in the emergency department for more immediate evaluation.  Bailey Mech, DNP, NP-c    Bailey Mech, NP 08/25/19 [phone]

## 2019-08-26 LAB — URINE CULTURE: Culture: NO GROWTH

## 2019-08-27 ENCOUNTER — Telehealth: Payer: Self-pay | Admitting: Infectious Diseases

## 2019-08-27 NOTE — Telephone Encounter (Signed)
Called to discuss with patient about Covid symptoms and the use of Regeneron, a monoclonal antibody infusion for those with mild to moderate Covid symptoms and at a high risk of hospitalization.  Pt is qualified for this infusion at the [facility] infusion center due to [employer].   Sx per [facility] records started 08/23/2019.    Message left to call back  MyChart message sent.

## 2019-08-29 ENCOUNTER — Other Ambulatory Visit: Payer: Self-pay | Admitting: Infectious Diseases

## 2019-08-29 DIAGNOSIS — R011 Cardiac murmur, unspecified: Secondary | ICD-10-CM

## 2019-08-29 DIAGNOSIS — E782 Mixed hyperlipidemia: Secondary | ICD-10-CM

## 2019-08-29 DIAGNOSIS — U071 COVID-19: Secondary | ICD-10-CM

## 2019-08-29 DIAGNOSIS — I10 Essential (primary) hypertension: Secondary | ICD-10-CM

## 2019-08-29 MED ORDER — SODIUM CHLORIDE 0.9 % IV SOLN
Freq: Once | INTRAVENOUS | Status: AC
  Filled 2019-08-29: qty 600

## 2019-08-29 NOTE — Progress Notes (Signed)
I connected by phone with Sherri Matthews on 08/29/2019 at 12:07 PM to discuss the potential use of a new treatment for mild to moderate COVID-19 viral infection in non-hospitalized patients.  This patient is a 54 y.o. female that meets the FDA criteria for Emergency Use Authorization of COVID monoclonal antibody casirivimab/imdevimab.  Has a (+) direct SARS-CoV-2 viral test result  Has mild or moderate COVID-19   Is NOT hospitalized due to COVID-19  Is within 10 days of symptom onset  Has at least one of the high risk factor(s) for progression to severe COVID-19 and/or hospitalization as defined in EUA.  Specific high risk criteria : BMI > 25, cardiac disease   I have spoken and communicated the following to the patient or parent/caregiver regarding COVID monoclonal antibody treatment:  1. FDA has authorized the emergency use for the treatment of mild to moderate COVID-19 in adults and pediatric patients with positive results of direct SARS-CoV-2 viral testing who are 18 years of age and older weighing at least 40 kg, and who are at high risk for progressing to severe COVID-19 and/or hospitalization.  2. The significant known and potential risks and benefits of COVID monoclonal antibody, and the extent to which such potential risks and benefits are unknown.  3. Information on available alternative treatments and the risks and benefits of those alternatives, including clinical trials.  4. Patients treated with COVID monoclonal antibody should continue to self-isolate and use infection control measures (e.g., wear mask, isolate, social distance, avoid sharing personal items, clean and disinfect "high touch" surfaces, and frequent handwashing) according to CDC guidelines.   5. The patient or parent/caregiver has the option to accept or refuse COVID monoclonal antibody treatment.  After reviewing this information with the patient, The patient agreed to proceed with receiving  casirivimab\imdevimab infusion and will be provided a copy of the Fact sheet prior to receiving the infusion. [name] 08/29/2019 12:07 PM

## 2019-08-29 NOTE — Telephone Encounter (Signed)
She would like to proceed with infusion - she still has nausea and diarrhea in addition to malaise.   She has done extensive personal research and would like to proceed with Regeneron infusion.  Scheduled for tomorrow AM.

## 2019-08-30 ENCOUNTER — Ambulatory Visit (HOSPITAL_COMMUNITY)
Admission: RE | Admit: 2019-08-30 | Discharge: 2019-08-30 | Disposition: A | Discharge status: Home / Self Care | Payer: BC Managed Care – PPO | Source: Ambulatory Visit | Attending: Pulmonary Disease | Admitting: Pulmonary Disease

## 2019-08-30 DIAGNOSIS — E782 Mixed hyperlipidemia: Secondary | ICD-10-CM | POA: Insufficient documentation

## 2019-08-30 DIAGNOSIS — U071 COVID-19: Secondary | ICD-10-CM | POA: Diagnosis not present

## 2019-08-30 DIAGNOSIS — I10 Essential (primary) hypertension: Secondary | ICD-10-CM | POA: Diagnosis present

## 2019-08-30 DIAGNOSIS — R011 Cardiac murmur, unspecified: Secondary | ICD-10-CM | POA: Insufficient documentation

## 2019-08-30 MED ORDER — FAMOTIDINE IN NACL 20-0.9 MG/50ML-% IV SOLN: 20.0000 mg | Freq: Once | INTRAVENOUS | Status: DC | PRN

## 2019-08-30 MED ORDER — METHYLPREDNISOLONE SODIUM SUCC 125 MG IJ SOLR: 125.0000 mg | Freq: Once | INTRAMUSCULAR | Status: DC | PRN

## 2019-08-30 MED ORDER — ONDANSETRON HCL 4 MG/2ML IJ SOLN
4.0000 mg | Freq: Once | INTRAMUSCULAR | Status: AC
  Administered 2019-08-30: 4 mg via INTRAVENOUS
  Filled 2019-08-30: qty 2

## 2019-08-30 MED ORDER — SODIUM CHLORIDE 0.9 % IV SOLN: INTRAVENOUS | Status: DC | PRN

## 2019-08-30 MED ORDER — EPINEPHRINE 0.3 MG/0.3ML IJ SOAJ: 0.3000 mg | Freq: Once | INTRAMUSCULAR | Status: DC | PRN

## 2019-08-30 MED ORDER — ALBUTEROL SULFATE HFA 108 (90 BASE) MCG/ACT IN AERS: 2.0000 | INHALATION_SPRAY | Freq: Once | RESPIRATORY_TRACT | Status: DC | PRN

## 2019-08-30 MED ORDER — DIPHENHYDRAMINE HCL 50 MG/ML IJ SOLN: 50.0000 mg | Freq: Once | INTRAMUSCULAR | Status: DC | PRN

## 2019-08-30 NOTE — Progress Notes (Signed)
  Diagnosis: COVID-19  Physician:Dr Delford Field  Procedure: Covid Infusion Clinic Med: casirivimab\imdevimab infusion - Provided patient with casirivimab\imdevimab fact sheet for patients, parents and caregivers prior to infusion.  Complications: No immediate complications noted.  Discharge: Discharged home   Chrisandra Netters Apple 08/30/2019

## 2019-08-30 NOTE — Discharge Instructions (Signed)

## 2019-11-30 ENCOUNTER — Other Ambulatory Visit: Payer: Self-pay

## 2019-11-30 ENCOUNTER — Ambulatory Visit: Payer: BC Managed Care – PPO | Admitting: Family Medicine

## 2019-11-30 ENCOUNTER — Encounter: Payer: Self-pay | Admitting: Family Medicine

## 2019-11-30 DIAGNOSIS — E7849 Other hyperlipidemia: Secondary | ICD-10-CM | POA: Diagnosis not present

## 2019-11-30 DIAGNOSIS — K219 Gastro-esophageal reflux disease without esophagitis: Secondary | ICD-10-CM

## 2019-11-30 DIAGNOSIS — I10 Essential (primary) hypertension: Secondary | ICD-10-CM

## 2019-11-30 DIAGNOSIS — F064 Anxiety disorder due to known physiological condition: Secondary | ICD-10-CM | POA: Diagnosis not present

## 2019-11-30 MED ORDER — SIMVASTATIN 40 MG PO TABS: 40.0000 mg | ORAL_TABLET | Freq: Every day | ORAL | 1 refills | Status: DC

## 2019-11-30 MED ORDER — METOPROLOL TARTRATE 25 MG PO TABS: ORAL_TABLET | ORAL | 1 refills | Status: DC

## 2019-11-30 MED ORDER — HYDROCHLOROTHIAZIDE 12.5 MG PO TABS: 12.5000 mg | ORAL_TABLET | Freq: Every day | ORAL | 1 refills | Status: DC

## 2019-11-30 MED ORDER — ALPRAZOLAM 0.5 MG PO TABS: ORAL_TABLET | ORAL | 5 refills | Status: DC

## 2019-11-30 MED ORDER — OMEPRAZOLE 40 MG PO CPDR: DELAYED_RELEASE_CAPSULE | ORAL | 1 refills | Status: DC

## 2019-11-30 NOTE — Patient Instructions (Signed)

## 2019-11-30 NOTE — Progress Notes (Signed)
Date:  11/30/2019   Name:  Sherri GoldsLisa Wilborn Lathon   DOB:  Apr 30, 1965   MRN:  [id number]   Chief Complaint: Hyperlipidemia, Hypertension, Gastroesophageal Reflux, Anemia, and Anxiety  Hyperlipidemia This is a chronic problem. The current episode started more than 1 year ago. The problem is controlled. Recent lipid tests were reviewed and are normal. She has no history of chronic renal disease, diabetes, hypothyroidism, liver disease, obesity or nephrotic syndrome. Pertinent negatives include no chest pain, focal sensory loss, focal weakness, leg pain, myalgias or shortness of breath. Current antihyperlipidemic treatment includes statins. The current treatment provides moderate improvement of lipids. There are no compliance problems.  Risk factors for coronary artery disease include hypertension, post-menopausal and dyslipidemia.  Hypertension This is a chronic problem. The current episode started more than 1 year ago. The problem has been gradually improving since onset. Associated symptoms include anxiety. Pertinent negatives include no chest pain, headaches, malaise/fatigue, palpitations or shortness of breath. There are no associated agents to hypertension. Risk factors for coronary artery disease include dyslipidemia and obesity. Past treatments include diuretics and beta blockers. The current treatment provides moderate improvement. There are no compliance problems.  There is no history of angina, kidney disease, CAD/MI, CVA, left ventricular hypertrophy, PVD or retinopathy. There is no history of chronic renal disease, a hypertension causing med or renovascular disease.  Gastroesophageal Reflux She complains of dysphagia. She reports no abdominal pain, no belching, no chest pain, no coughing, no globus sensation, no nausea, no sore throat or no wheezing. endoscopy "fine". This is a chronic problem. The problem occurs occasionally. Associated symptoms include fatigue. Pertinent negatives include no  melena or weight loss. She has tried a PPI for the symptoms. The treatment provided moderate relief.  Anemia Presents for follow-up visit. There has been no abdominal pain, anorexia, bruising/bleeding easily, confusion, fever, leg swelling, light-headedness, malaise/fatigue, pallor, palpitations, paresthesias or weight loss. Signs of blood loss that are not present include hematemesis, hematochezia, melena, menorrhagia and vaginal bleeding. There is no history of chronic renal disease or hypothyroidism. There are no compliance problems.   Anxiety Presents for follow-up visit. Symptoms include excessive worry and nervous/anxious behavior. Patient reports no chest pain, confusion, decreased concentration, dizziness, nausea, palpitations or shortness of breath. Primary symptoms comment: endoscopy "fine". The severity of symptoms is mild.   Her past medical history is significant for anemia.    Lab Results  Component Value Date   CREATININE 0.82 05/17/2019   BUN 16 05/17/2019   NA 142 05/17/2019   K 4.3 05/17/2019   CL 104 05/17/2019   CO2 25 05/17/2019   Lab Results  Component Value Date   CHOL 195 05/17/2019   HDL 44 05/17/2019   LDLCALC 121 (H) 05/17/2019   TRIG 170 (H) 05/17/2019   CHOLHDL 4.6 (H) 02/24/2018   Lab Results  Component Value Date   TSH 0.910 11/01/2017   No results found for: HGBA1C Lab Results  Component Value Date   WBC 5.3 05/17/2019   HGB 12.9 05/17/2019   HCT 38.4 05/17/2019   MCV 86 05/17/2019   PLT 225 05/17/2019   Lab Results  Component Value Date   ALT 30 05/17/2019   AST 30 05/17/2019   ALKPHOS 112 05/17/2019   BILITOT 0.8 05/17/2019     Review of Systems  Constitutional: Positive for fatigue. Negative for chills, fever, malaise/fatigue, unexpected weight change and weight loss.  HENT: Positive for postnasal drip. Negative for congestion, ear discharge, ear pain, rhinorrhea, sinus  pressure, sneezing and sore throat.   Eyes: Negative for  photophobia, pain, discharge, redness and itching.  Respiratory: Negative for cough, shortness of breath, wheezing and stridor.   Cardiovascular: Negative for chest pain and palpitations.  Gastrointestinal: Positive for dysphagia. Negative for abdominal pain, anorexia, blood in stool, constipation, diarrhea, hematemesis, hematochezia, melena, nausea and vomiting.  Endocrine: Negative for cold intolerance, heat intolerance, polydipsia, polyphagia and polyuria.  Genitourinary: Negative for dysuria, flank pain, frequency, hematuria, menorrhagia, menstrual problem, pelvic pain, urgency, vaginal bleeding and vaginal discharge.  Musculoskeletal: Negative for arthralgias, back pain and myalgias.  Skin: Negative for pallor and rash.  Allergic/Immunologic: Negative for environmental allergies and food allergies.  Neurological: Negative for dizziness, focal weakness, weakness, light-headedness, numbness, headaches and paresthesias.  Hematological: Negative for adenopathy. Does not bruise/bleed easily.  Psychiatric/Behavioral: Negative for confusion, decreased concentration and dysphoric mood. The patient is nervous/anxious.     Patient Active Problem List   Diagnosis Date Noted  . Chest pain 06/19/2016  . Abnormal EKG 06/19/2016  . Menopause 01/28/2016  . Essential hypertension 05/15/2015  . Hyperlipidemia 05/15/2015  . Gastritis   . 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 Heart murmur 07/16/2014  . Gastroesophageal reflux disease 07/16/2014  . H/O: osteoarthritis 07/16/2014    Allergies  Allergen Reactions  . Oxycodone-Acetaminophen Itching  . Oxycodone-Acetaminophen Itching  . Ciprofloxacin Nausea And Vomiting  . Dilaudid [Hydromorphone Hcl]   . Fenofibrate     myalgia  . Sertraline Other (See Comments)    Pt prefers to never be on this  again.    . Vicodin [Hydrocodone-Acetaminophen] Other (See Comments)    Heart raced.    Past Surgical History:  Procedure Laterality Date  . APPENDECTOMY  2008  . CARDIAC CATHETERIZATION N/A 09/12/2015   Procedure: Left Heart Cath and Coronary Angiography;  Surgeon: Marcina Millard, MD;  Location: [facility];  Service: Cardiovascular;  Laterality: N/A;  . ESOPHAGOGASTRODUODENOSCOPY (EGD) WITH PROPOFOL N/A 04/18/2015   Procedure: ESOPHAGOGASTRODUODENOSCOPY (EGD) WITH PROPOFOL;  Surgeon: Midge Minium, MD;  Location: [facility];  Service: Endoscopy;  Laterality: N/A;  . HERNIA REPAIR      Social History   Tobacco Use  . Smoking status: Never Smoker  . Smokeless tobacco: Never Used  Vaping Use  . Vaping Use: Never used  Substance Use Topics  . Alcohol use: No    Alcohol/week: 0.0 standard drinks  . Drug use: No     Medication list has been reviewed and updated.  Current Meds  Medication Sig  . ALPRAZolam (XANAX) 0.5 MG tablet TAKE ONE TABLET BY MOUTH ONCE DAILY AS NEEDED  . Ascorbic Acid (VITAMIN C) 100 MG CHEW Chew 1 tablet by mouth daily.  . B Complex-C (SUPER B COMPLEX PO) Take 1 tablet by mouth daily.  . ferrous sulfate 325 (65 FE) MG tablet Take 1 tablet (325 mg total) by mouth daily with breakfast.  . hydrochlorothiazide (HYDRODIURIL) 12.5 MG tablet Take 1 tablet (12.5 mg total) by mouth daily.  Marland Kitchen ibuprofen (ADVIL,MOTRIN) 200 MG tablet Take 200 mg by mouth every 6 (six) hours as needed.  . Magnesium 250 MG TABS Take 1 tablet by mouth daily.  . metoprolol tartrate (LOPRESSOR) 25 MG tablet TAKE 1/2 (ONE-HALF) TABLET BY MOUTH TWICE DAILY  . Nutritional Supplements (VITAMIN D BOOSTER PO) Take 1 capsule by mouth daily.  Marland Kitchen omeprazole (PRILOSEC) 40 MG capsule TAKE  1 CAPSULE BY MOUTH TWICE DAILY. NEED APPOINTMENT FOR FURTHER REFILLS.  Marland Kitchen simvastatin (ZOCOR) 40 MG tablet Take 1 tablet (40 mg total) by mouth daily.    PHQ 2/9 Scores 11/30/2019 05/17/2019  11/25/2016 05/05/2015  PHQ - 2 Score 0 0 0 0  PHQ- 9 Score 2 8 2  -    GAD 7 : Generalized Anxiety Score 11/30/2019 05/17/2019 09/20/2018  Nervous, Anxious, on Edge 0 1 0  Control/stop worrying 0 0 0  Worry too much - different things 0 0 0  Trouble relaxing 0 0 0  Restless 0 1 0  Easily annoyed or irritable 0 0 1  Afraid - awful might happen 0 0 0  Total GAD 7 Score 0 2 1  Anxiety Difficulty - Not difficult at all Not difficult at all    BP Readings from Last 3 Encounters:  11/30/19 120/80  08/30/19 130/74  08/25/19 128/81    Physical Exam Vitals and nursing note reviewed.  Constitutional:      Appearance: She is well-developed.  HENT:     Head: Normocephalic.     Right Ear: Tympanic membrane, ear canal and external ear normal.     Left Ear: Tympanic membrane, ear canal and external ear normal.     Nose: Nose normal.     Mouth/Throat:     Mouth: Mucous membranes are moist.  Eyes:     General: Lids are everted, no foreign bodies appreciated. No scleral icterus.       Right eye: No discharge.        Left eye: No foreign body, discharge or hordeolum.     Conjunctiva/sclera: Conjunctivae normal.     Right eye: Right conjunctiva is not injected.     Left eye: Left conjunctiva is not injected.     Pupils: Pupils are equal, round, and reactive to light.  Neck:     Thyroid: No thyromegaly.     Vascular: No JVD.     Trachea: No tracheal deviation.  Cardiovascular:     Rate and Rhythm: Normal rate and regular rhythm.     Pulses: Normal pulses.     Heart sounds: Normal heart sounds. No murmur heard.  No friction rub. No gallop.   Pulmonary:     Effort: Pulmonary effort is normal. No respiratory distress.     Breath sounds: Normal breath sounds. No stridor. No wheezing, rhonchi or rales.  Chest:     Chest wall: No tenderness.  Abdominal:     General: Bowel sounds are normal.     Palpations: Abdomen is soft. There is no mass.     Tenderness: There is no abdominal tenderness.  There is no right CVA tenderness, left CVA tenderness, guarding or rebound.     Hernia: No hernia is present.  Musculoskeletal:        General: No tenderness. Normal range of motion.     Cervical back: Normal range of motion and neck supple.  Lymphadenopathy:     Cervical: No cervical adenopathy.  Skin:    General: Skin is warm.     Findings: No bruising, erythema or rash.  Neurological:     Mental Status: She is alert and oriented to person, place, and time.     Cranial Nerves: No cranial nerve deficit.     Motor: No weakness.     Coordination: Coordination normal.     Deep Tendon Reflexes: Reflexes normal.  Psychiatric:        Mood and Affect: Mood  is not anxious or depressed.     Wt Readings from Last 3 Encounters:  11/30/19 219 lb (99.3 kg)  08/25/19 210 lb (95.3 kg)  05/17/19 224 lb (101.6 kg)    BP 120/80   Pulse 80   Ht 5\' 11"  (1.803 m)   Wt 219 lb (99.3 kg)   LMP 04/18/2017 (Approximate)   BMI 30.54 kg/m   Assessment and Plan: 1. Essential (primary) hypertension Chronic.  Controlled.  Stable.  Continue metoprolol 25 mg 1/2 tablet daily as well as hydrochlorothiazide 12.5 mg 1 a day. - ALPRAZolam (XANAX) 0.5 MG tablet; TAKE ONE TABLET BY MOUTH ONCE DAILY AS NEEDED  Dispense: 30 tablet; Refill: 5 - metoprolol tartrate (LOPRESSOR) 25 MG tablet; TAKE 1/2 (ONE-HALF) TABLET BY MOUTH TWICE DAILY  Dispense: 90 tablet; Refill: 1 - hydrochlorothiazide (HYDRODIURIL) 12.5 MG tablet; Take 1 tablet (12.5 mg total) by mouth daily.  Dispense: 90 tablet; Refill: 1  2. 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  Controlled.  Stable.  Gad score 0 PHQ 2 continue alprazolam 0.5 once a day. - ALPRAZolam (XANAX) 0.5 MG tablet; TAKE ONE TABLET BY MOUTH ONCE DAILY AS NEEDED  Dispense: 30 tablet; Refill: 5  3. Familial multiple lipoprotein-type hyperlipidemia Chronic.  Controlled.  Stable.  Review of previous lipid panel was stable patient was given low-cholesterol  sheets for dietary control and encouraged to continue her weight loss which is doing well.  Refill simvastatin 40 mg once a day. - simvastatin (ZOCOR) 40 MG tablet; Take 1 tablet (40 mg total) by mouth daily.  Dispense: 90 tablet; Refill: 1  4. Gastroesophageal reflux disease Chronic.  Controlled.  Stable.  Continue omeprazole 40 mg 1 twice a day. - omeprazole (PRILOSEC) 40 MG capsule; TAKE 1 CAPSULE BY MOUTH TWICE DAILY. NEED APPOINTMENT FOR FURTHER REFILLS.  Dispense: 180 capsule; Refill: 1

## 2019-12-10 ENCOUNTER — Ambulatory Visit: Payer: Self-pay | Admitting: Family Medicine

## 2019-12-10 ENCOUNTER — Other Ambulatory Visit: Payer: Self-pay

## 2019-12-10 ENCOUNTER — Encounter: Payer: Self-pay | Admitting: Family Medicine

## 2019-12-10 ENCOUNTER — Ambulatory Visit: Payer: BC Managed Care – PPO | Admitting: Family Medicine

## 2019-12-10 VITALS — BP 140/82 | HR 64 | Ht 71.0 in | Wt 219.0 lb

## 2019-12-10 DIAGNOSIS — I10 Essential (primary) hypertension: Secondary | ICD-10-CM | POA: Diagnosis not present

## 2019-12-10 MED ORDER — METOPROLOL SUCCINATE ER 25 MG PO TB24: 12.5000 mg | ORAL_TABLET | Freq: Every day | ORAL | 0 refills | Status: DC

## 2019-12-10 NOTE — Telephone Encounter (Signed)
Phone call rec'd from pt.  C/o elevated BP's and feeling more anxious and tense since Friday.  Reported episode on 10/15, of feeling "weird".  Described feeling tight in her shoulders, headache, and heart racing. BP at approx. 4:00 PM on 10/15 194/104, Pulse 102.  Reported monitoring BP over the weekend, and has some elevated readings, but not as high.  Stated this AM, she woke up feeling like her BP was elevated.  BP @ 6:15 AM was in 150/90 range.  She took her Metoprolol earlier than usual, at 6:30 AM.  BP checked at 7:30 by the school nurse; 160/88.  Denied any headache today.  Denied dizziness, vision change, weakness in extremities, or difficulty with speech.  Denied chest pain or shortness of breath.  Stated her shoulders feel tight.  Denied symptoms of heart racing today.   Appt. Sched. @ 2:40 PM today.  Enc. To have School nurse continue to monitor BP today, and to call back if symptoms worsen.  Pt. Verb. Understanding.    Reason for Disposition . Systolic BP  >= 160 OR Diastolic >= 100  Answer Assessment - Initial Assessment Questions 1. BLOOD PRESSURE: "What is the blood pressure?" "Did you take at least two measurements 5 minutes apart?"     194/104 @ 4:00 PM 10/15; 150/90 @ 6:15 AM today   160/88 @ 7:30 AM today (by office nurse)  2. ONSET: "When did you take your blood pressure?"     Friday, 12/07/19 3. HOW: "How did you obtain the blood pressure?" (e.g., visiting nurse, automatic home BP monitor)     Automatic home BP monitor and from office nurse 4. HISTORY: "Do you have a history of high blood pressure?"     High BP  5. MEDICATIONS: "Are you taking any medications for blood pressure?" "Have you missed any doses recently?"    Metoprolol  6. OTHER SYMPTOMS: "Do you have any symptoms?" (e.g., headache, chest pain, blurred vision, difficulty breathing, weakness)     Feels tight through shoulders; "like more tense"; felt heart palpitations on Friday; denied change speech, in vision, or  weakness in extremities;    7. PREGNANCY: "Is there any chance you are pregnant?" "When was your last menstrual period?"     N/a  Protocols used: BLOOD PRESSURE - HIGH-A-AH

## 2019-12-10 NOTE — Progress Notes (Signed)
Date:  12/10/2019   Name:  Sherri Matthews   DOB:  1965/09/10   MRN:  [id number]   Chief Complaint: Hypertension (b/p readings started being high over the weekend. Is getting 140-170 over 90s)  Hypertension    Lab Results  Component Value Date   CREATININE 0.82 05/17/2019   BUN 16 05/17/2019   NA 142 05/17/2019   K 4.3 05/17/2019   CL 104 05/17/2019   CO2 25 05/17/2019   Lab Results  Component Value Date   CHOL 195 05/17/2019   HDL 44 05/17/2019   LDLCALC 121 (H) 05/17/2019   TRIG 170 (H) 05/17/2019   CHOLHDL 4.6 (H) 02/24/2018   Lab Results  Component Value Date   TSH 0.910 11/01/2017   No results found for: HGBA1C Lab Results  Component Value Date   WBC 5.3 05/17/2019   HGB 12.9 05/17/2019   HCT 38.4 05/17/2019   MCV 86 05/17/2019   PLT 225 05/17/2019   Lab Results  Component Value Date   ALT 30 05/17/2019   AST 30 05/17/2019   ALKPHOS 112 05/17/2019   BILITOT 0.8 05/17/2019     Review of Systems  Patient Active Problem List   Diagnosis Date Noted  . Chest pain 06/19/2016  . Abnormal EKG 06/19/2016  . Menopause 01/28/2016  . Essential hypertension 05/15/2015  . Hyperlipidemia 05/15/2015  . Gastritis   . 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 Heart murmur 07/16/2014  . Gastroesophageal reflux disease 07/16/2014  . H/O: osteoarthritis 07/16/2014    Allergies  Allergen Reactions  . Oxycodone-Acetaminophen Itching  . Oxycodone-Acetaminophen Itching  . Ciprofloxacin Nausea And Vomiting  . Dilaudid [Hydromorphone Hcl]   . Fenofibrate     myalgia  . Sertraline Other (See Comments)    Pt prefers to never be on this again.    . Vicodin [Hydrocodone-Acetaminophen] Other (See Comments)    Heart raced.    Past Surgical History:  Procedure Laterality Date  . APPENDECTOMY  2008  . CARDIAC  CATHETERIZATION N/A 09/12/2015   Procedure: Left Heart Cath and Coronary Angiography;  Surgeon: Marcina Millard, MD;  Location: [facility];  Service: Cardiovascular;  Laterality: N/A;  . ESOPHAGOGASTRODUODENOSCOPY (EGD) WITH PROPOFOL N/A 04/18/2015   Procedure: ESOPHAGOGASTRODUODENOSCOPY (EGD) WITH PROPOFOL;  Surgeon: Midge Minium, MD;  Location: [facility];  Service: Endoscopy;  Laterality: N/A;  . HERNIA REPAIR      Social History   Tobacco Use  . Smoking status: Never Smoker  . Smokeless tobacco: Never Used  Vaping Use  . Vaping Use: Never used  Substance Use Topics  . Alcohol use: No    Alcohol/week: 0.0 standard drinks  . Drug use: No     Medication list has been reviewed and updated.  Current Meds  Medication Sig  . ALPRAZolam (XANAX) 0.5 MG tablet TAKE ONE TABLET BY MOUTH ONCE DAILY AS NEEDED  . Ascorbic Acid (VITAMIN C) 100 MG CHEW Chew 1 tablet by mouth daily.  . B Complex-C (SUPER B COMPLEX PO) Take 1 tablet by mouth daily.  . ferrous sulfate 325 (65 FE) MG tablet Take 1 tablet (325 mg total) by mouth daily with breakfast.  . hydrochlorothiazide (HYDRODIURIL) 12.5 MG tablet Take 1 tablet (12.5 mg total) by mouth daily.  Marland Kitchen ibuprofen (ADVIL,MOTRIN) 200 MG tablet Take 200 mg by mouth every 6 (six) hours as needed.  Marland Kitchen  Magnesium 250 MG TABS Take 1 tablet by mouth daily.  . metoprolol tartrate (LOPRESSOR) 25 MG tablet TAKE 1/2 (ONE-HALF) TABLET BY MOUTH TWICE DAILY  . Nutritional Supplements (VITAMIN D BOOSTER PO) Take 1 capsule by mouth daily.  Marland Kitchen omeprazole (PRILOSEC) 40 MG capsule TAKE 1 CAPSULE BY MOUTH TWICE DAILY. NEED APPOINTMENT FOR FURTHER REFILLS.  Marland Kitchen simvastatin (ZOCOR) 40 MG tablet Take 1 tablet (40 mg total) by mouth daily.    PHQ 2/9 Scores 11/30/2019 05/17/2019 11/25/2016 05/05/2015  PHQ - 2 Score 0 0 0 0  PHQ- 9 Score 2 8 2  -    GAD 7 : Generalized Anxiety Score 11/30/2019 05/17/2019 09/20/2018  Nervous, Anxious, on Edge 0 1 0  Control/stop  worrying 0 0 0  Worry too much - different things 0 0 0  Trouble relaxing 0 0 0  Restless 0 1 0  Easily annoyed or irritable 0 0 1  Afraid - awful might happen 0 0 0  Total GAD 7 Score 0 2 1  Anxiety Difficulty - Not difficult at all Not difficult at all    BP Readings from Last 3 Encounters:  12/10/19 140/82  11/30/19 120/80  08/30/19 130/74    Physical Exam  Wt Readings from Last 3 Encounters:  12/10/19 219 lb (99.3 kg)  11/30/19 219 lb (99.3 kg)  08/25/19 210 lb (95.3 kg)    BP 140/82   Pulse 64   Ht 5\' 11"  (1.803 m)   Wt 219 lb (99.3 kg)   LMP 04/18/2017 (Approximate)   BMI 30.54 kg/m   Assessment and Plan: 1. Essential (primary) hypertension Chronic.  Controlled.  Stable.  Patient has been taking her own blood pressure which at times seems to be more under the control of her level of anxiety than the actual blood pressure readings.  Patient brings her blood pressure cuff along with our readings which match.  Patient is on beta-blocker for both rate control and blood pressure control as well.  Therefore patient is having some anxiety and blood pressure is elevated and is perpetuated when patient continues to check it thereafter.  Patient is unable to take the full dosing of 25 mg of metoprolol due to fatigue and malaise.  We will discontinue the 12.5 mg twice a day metoprolol tartrate and replace it with metoprolol succinate 25 mg XL 1/2 tablet daily.  Patient will return for blood pressure check in the near future, in the meantime we have reestablished appoint with Dr. to discuss any further change if dosing changes unsuccessful and blood pressure control. - metoprolol succinate (TOPROL XL) 25 MG 24 hr tablet; Take 0.5 tablets (12.5 mg total) by mouth daily.  Dispense: 30 tablet; Refill: 0

## 2019-12-17 ENCOUNTER — Other Ambulatory Visit: Payer: Self-pay

## 2019-12-25 DIAGNOSIS — G4733 Obstructive sleep apnea (adult) (pediatric): Secondary | ICD-10-CM | POA: Insufficient documentation

## 2020-05-22 ENCOUNTER — Ambulatory Visit: Payer: BC Managed Care – PPO | Admitting: Family Medicine

## 2020-05-22 ENCOUNTER — Encounter: Payer: Self-pay | Admitting: Family Medicine

## 2020-05-22 ENCOUNTER — Other Ambulatory Visit: Payer: Self-pay

## 2020-05-22 VITALS — BP 102/64 | HR 76 | Ht 71.0 in | Wt 224.0 lb

## 2020-05-22 DIAGNOSIS — E7849 Other hyperlipidemia: Secondary | ICD-10-CM

## 2020-05-22 DIAGNOSIS — I10 Essential (primary) hypertension: Secondary | ICD-10-CM

## 2020-05-22 DIAGNOSIS — D509 Iron deficiency anemia, unspecified: Secondary | ICD-10-CM | POA: Diagnosis not present

## 2020-05-22 DIAGNOSIS — N342 Other urethritis: Secondary | ICD-10-CM | POA: Diagnosis not present

## 2020-05-22 DIAGNOSIS — K219 Gastro-esophageal reflux disease without esophagitis: Secondary | ICD-10-CM | POA: Diagnosis not present

## 2020-05-22 DIAGNOSIS — F064 Anxiety disorder due to known physiological condition: Secondary | ICD-10-CM

## 2020-05-22 LAB — POCT URINALYSIS DIPSTICK
Bilirubin, UA: NEGATIVE
Blood, UA: NEGATIVE
Glucose, UA: NEGATIVE
Ketones, UA: NEGATIVE
Leukocytes, UA: NEGATIVE
Nitrite, UA: NEGATIVE
Protein, UA: NEGATIVE
Spec Grav, UA: 1.015 (ref 1.010–1.025)
Urobilinogen, UA: 0.2 E.U./dL
pH, UA: 6 (ref 5.0–8.0)

## 2020-05-22 MED ORDER — NITROFURANTOIN MONOHYD MACRO 100 MG PO CAPS: 100.0000 mg | ORAL_CAPSULE | Freq: Two times a day (BID) | ORAL | 0 refills | Status: DC

## 2020-05-22 MED ORDER — OMEPRAZOLE 40 MG PO CPDR: DELAYED_RELEASE_CAPSULE | ORAL | 1 refills | Status: DC

## 2020-05-22 MED ORDER — SIMVASTATIN 40 MG PO TABS: 40.0000 mg | ORAL_TABLET | Freq: Every day | ORAL | 1 refills | Status: DC

## 2020-05-22 MED ORDER — AMLODIPINE BESYLATE 10 MG PO TABS: 5.0000 mg | ORAL_TABLET | Freq: Every day | ORAL | 1 refills | Status: DC

## 2020-05-22 MED ORDER — METOPROLOL TARTRATE 25 MG PO TABS: 12.5000 mg | ORAL_TABLET | Freq: Two times a day (BID) | ORAL | 1 refills | Status: DC

## 2020-05-22 MED ORDER — FERROUS SULFATE 325 (65 FE) MG PO TABS: 325.0000 mg | ORAL_TABLET | Freq: Every day | ORAL | 1 refills | Status: DC

## 2020-05-22 NOTE — Progress Notes (Signed)
Date:  05/22/2020   Name:  Sherri Matthews   DOB:  1965-04-16   MRN:  [id number]   Chief Complaint: Hypertension, Hyperlipidemia, Anemia, Anxiety (2 and 3), and Gastroesophageal Reflux  Hypertension This is a chronic problem. The current episode started more than 1 year ago. The problem has been waxing and waning since onset. The problem is controlled. Associated symptoms include anxiety. Pertinent negatives include no blurred vision, chest pain, malaise/fatigue, orthopnea, palpitations, peripheral edema, PND, shortness of breath or sweats. There are no associated agents to hypertension. There are no known risk factors for coronary artery disease. Past treatments include beta blockers and calcium channel blockers. The current treatment provides moderate improvement. There are no compliance problems.  There is no history of angina, kidney disease, CAD/MI, CVA, heart failure, left ventricular hypertrophy, PVD or retinopathy. There is no history of a hypertension causing med or renovascular disease.  Hyperlipidemia This is a chronic problem. The current episode started more than 1 year ago. The problem is controlled. Recent lipid tests were reviewed and are normal. Pertinent negatives include no chest pain, myalgias or shortness of breath. Current antihyperlipidemic treatment includes statins. The current treatment provides moderate improvement of lipids. There are no compliance problems.   Anemia Presents for follow-up visit. There has been no anorexia, bruising/bleeding easily, confusion, fever, leg swelling, malaise/fatigue, pallor, palpitations, paresthesias or weight loss. Signs of blood loss that are not present include hematemesis, hematochezia and melena. There is no history of heart failure. There are no compliance problems.   Anxiety Presents for follow-up visit. Symptoms include excessive worry and nervous/anxious behavior. Patient reports no chest pain, compulsions, confusion, decreased  concentration, depressed mood, dizziness, dry mouth, feeling of choking, hyperventilation, impotence, insomnia, irritability, malaise, muscle tension, obsessions, palpitations, panic, restlessness, shortness of breath or suicidal ideas. Symptoms occur occasionally. The severity of symptoms is mild.   Her past medical history is significant for anemia.  Gastroesophageal Reflux She reports no belching, no chest pain, no choking, no dysphagia, no early satiety, no globus sensation, no heartburn, no hoarse voice, no stridor, no tooth decay or no water brash. This is a chronic problem. The current episode started more than 1 year ago. Pertinent negatives include no melena or weight loss. She has tried a PPI for the symptoms. The treatment provided moderate relief.  Dysuria  This is a chronic problem. The current episode started more than 1 year ago. The quality of the pain is described as burning. There has been no fever. Associated symptoms include frequency and urgency. Pertinent negatives include no discharge, hematuria, hesitancy or sweats. The treatment provided mild relief.    Lab Results  Component Value Date   CREATININE 0.82 05/17/2019   BUN 16 05/17/2019   NA 142 05/17/2019   K 4.3 05/17/2019   CL 104 05/17/2019   CO2 25 05/17/2019   Lab Results  Component Value Date   CHOL 195 05/17/2019   HDL 44 05/17/2019   LDLCALC 121 (H) 05/17/2019   TRIG 170 (H) 05/17/2019   CHOLHDL 4.6 (H) 02/24/2018   Lab Results  Component Value Date   TSH 0.910 11/01/2017   No results found for: HGBA1C Lab Results  Component Value Date   WBC 5.3 05/17/2019   HGB 12.9 05/17/2019   HCT 38.4 05/17/2019   MCV 86 05/17/2019   PLT 225 05/17/2019   Lab Results  Component Value Date   ALT 30 05/17/2019   AST 30 05/17/2019   ALKPHOS 112 05/17/2019  BILITOT 0.8 05/17/2019     Review of Systems  Constitutional: Negative for fever, irritability, malaise/fatigue and weight loss.  HENT: Negative  for congestion, dental problem, hoarse voice, nosebleeds, postnasal drip and rhinorrhea.   Eyes: Negative for blurred vision.  Respiratory: Negative for choking and shortness of breath.   Cardiovascular: Negative for chest pain, palpitations, orthopnea, leg swelling and PND.  Gastrointestinal: Negative for anorexia, dysphagia, heartburn, hematemesis, hematochezia and melena.  Genitourinary: Positive for dysuria, frequency and urgency. Negative for hematuria, hesitancy and impotence.  Musculoskeletal: Negative for myalgias.  Skin: Negative for pallor.  Neurological: Negative for dizziness and paresthesias.  Hematological: Does not bruise/bleed easily.  Psychiatric/Behavioral: Negative for confusion, decreased concentration and suicidal ideas. The patient is nervous/anxious. The patient does not have insomnia.     Patient Active Problem List   Diagnosis Date Noted  . Chest pain 06/19/2016  . Abnormal EKG 06/19/2016  . Menopause 01/28/2016  . Essential hypertension 05/15/2015  . Hyperlipidemia 05/15/2015  . Gastritis   . 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 Heart murmur 07/16/2014  . Gastroesophageal reflux disease 07/16/2014  . H/O: osteoarthritis 07/16/2014    Allergies  Allergen Reactions  . Oxycodone-Acetaminophen Itching  . Oxycodone-Acetaminophen Itching  . Ciprofloxacin Nausea And Vomiting  . Dilaudid [Hydromorphone Hcl]   . Fenofibrate     myalgia  . Sertraline Other (See Comments)    Pt prefers to never be on this again.    . Vicodin [Hydrocodone-Acetaminophen] Other (See Comments)    Heart raced.    Past Surgical History:  Procedure Laterality Date  . APPENDECTOMY  2008  . CARDIAC CATHETERIZATION N/A 09/12/2015   Procedure: Left Heart Cath and Coronary Angiography;  Surgeon: Marcina MillardAlexander Paraschos, MD;  Location:  [facility];  Service: Cardiovascular;  Laterality: N/A;  . ESOPHAGOGASTRODUODENOSCOPY (EGD) WITH PROPOFOL N/A 04/18/2015   Procedure: ESOPHAGOGASTRODUODENOSCOPY (EGD) WITH PROPOFOL;  Surgeon: Midge Miniumarren Wohl, MD;  [facility];  Service: Endoscopy;  Laterality: N/A;  . HERNIA REPAIR      Social History   Tobacco Use  . Smoking status: Never Smoker  . Smokeless tobacco: Never Used  Vaping Use  . Vaping Use: Never used  Substance Use Topics  . Alcohol use: No    Alcohol/week: 0.0 standard drinks  . Drug use: No     Medication list has been reviewed and updated.  Current Meds  Medication Sig  . ALPRAZolam (XANAX) 0.5 MG tablet TAKE ONE TABLET BY MOUTH ONCE DAILY AS NEEDED  . amLODipine (NORVASC) 10 MG tablet Take 1 tablet by mouth daily.  . Ascorbic Acid (VITAMIN C) 100 MG CHEW Chew 1 tablet by mouth daily.  . B Complex-C (SUPER B COMPLEX PO) Take 1 tablet by mouth daily.  . ferrous sulfate 325 (65 FE) MG tablet Take 1 tablet (325 mg total) by mouth daily with breakfast.  . ibuprofen (ADVIL,MOTRIN) 200 MG tablet Take 200 mg by mouth every 6 (six) hours as needed.  . Magnesium 250 MG TABS Take 1 tablet by mouth daily.  . metoprolol tartrate (LOPRESSOR) 25 MG tablet Take 0.5 tablets by mouth 2 (two) times daily.  . Nutritional Supplements (VITAMIN D BOOSTER PO) Take 1 capsule by mouth daily.  Marland Kitchen. omeprazole (PRILOSEC) 40 MG capsule TAKE 1 CAPSULE BY MOUTH TWICE DAILY. NEED APPOINTMENT FOR FURTHER REFILLS.  Marland Kitchen. simvastatin (ZOCOR) 40 MG tablet Take 1 tablet (  40 mg total) by mouth daily.  . [DISCONTINUED] metoprolol succinate (TOPROL XL) 25 MG 24 hr tablet Take 0.5 tablets (12.5 mg total) by mouth daily.    PHQ 2/9 Scores 05/22/2020 11/30/2019 05/17/2019 11/25/2016  PHQ - 2 Score 0 0 0 0  PHQ- 9 Score 2 2 8 2     GAD 7 : Generalized Anxiety Score 05/22/2020 11/30/2019 05/17/2019 09/20/2018  Nervous, Anxious, on Edge 1 0 1 0  Control/stop worrying 0 0 0 0  Worry too  much - different things 0 0 0 0  Trouble relaxing 0 0 0 0  Restless 0 0 1 0  Easily annoyed or irritable 2 0 0 1  Afraid - awful might happen 0 0 0 0  Total GAD 7 Score 3 0 2 1  Anxiety Difficulty Not difficult at all - Not difficult at all Not difficult at all    BP Readings from Last 3 Encounters:  05/22/20 102/64  12/10/19 140/82  11/30/19 120/80    Physical Exam Vitals and nursing note reviewed.  Constitutional:      Appearance: She is well-developed.  HENT:     Head: Normocephalic.     Right Ear: Tympanic membrane, ear canal and external ear normal.     Left Ear: Tympanic membrane, ear canal and external ear normal.  Eyes:     General: Lids are everted, no foreign bodies appreciated. No scleral icterus.       Left eye: No foreign body or hordeolum.     Conjunctiva/sclera: Conjunctivae normal.     Right eye: Right conjunctiva is not injected.     Left eye: Left conjunctiva is not injected.     Pupils: Pupils are equal, round, and reactive to light.  Neck:     Thyroid: No thyromegaly.     Vascular: No JVD.     Trachea: No tracheal deviation.  Cardiovascular:     Rate and Rhythm: Normal rate and regular rhythm.     Heart sounds: Normal heart sounds. No murmur heard. No friction rub. No gallop.   Pulmonary:     Effort: Pulmonary effort is normal. No respiratory distress.     Breath sounds: Normal breath sounds. No wheezing or rales.  Abdominal:     General: Bowel sounds are normal.     Palpations: Abdomen is soft. There is no mass.     Tenderness: There is no abdominal tenderness. There is no guarding or rebound.  Musculoskeletal:        General: No tenderness. Normal range of motion.     Cervical back: Normal range of motion and neck supple.  Lymphadenopathy:     Cervical: No cervical adenopathy.  Skin:    General: Skin is warm.     Findings: No rash.  Neurological:     Mental Status: She is alert and oriented to person, place, and time.     Cranial Nerves: No  cranial nerve deficit.     Deep Tendon Reflexes: Reflexes normal.  Psychiatric:        Mood and Affect: Mood is not anxious or depressed.     Wt Readings from Last 3 Encounters:  05/22/20 224 lb (101.6 kg)  12/10/19 219 lb (99.3 kg)  11/30/19 219 lb (99.3 kg)    BP 102/64   Pulse 76   Ht 5\' 11"  (1.803 m)   Wt 224 lb (101.6 kg)   LMP 04/18/2017 (Approximate)   BMI 31.24 kg/m   Assessment and Plan:  1.  Essential (primary) hypertension Chronic.  Controlled.  Stable.  Blood pressure today is 102/64.  Continue metoprolol tartrate 25 mg 1/2 tablet twice a day and amlodipine 10 mg 1/2 tablet for total of 5 mg daily.  Review of renal function panel was acceptable. - amLODipine (NORVASC) 10 MG tablet; Take 0.5 tablets (5 mg total) by mouth daily.  Dispense: 90 tablet; Refill: 1 - metoprolol tartrate (LOPRESSOR) 25 MG tablet; Take 0.5 tablets (12.5 mg total) by mouth 2 (two) times daily.  Dispense: 45 tablet; Refill: 1  2. Iron deficiency anemia, unspecified iron deficiency anemia type Chronic.  Controlled.  Stable.  Continue ferrous sulfate 325 once a day.  Will check CBC for current status. - CBC w/Diff/Platelet - ferrous sulfate 325 (65 FE) MG tablet; Take 1 tablet (325 mg total) by mouth daily with breakfast.  Dispense: 90 tablet; Refill: 1  3. Gastroesophageal reflux disease .  Controlled.  Stable.  Continue omeprazole 40 mg twice a day. - omeprazole (PRILOSEC) 40 MG capsule; TAKE 1 CAPSULE BY MOUTH TWICE DAILY. NEED APPOINTMENT FOR FURTHER REFILLS.  Dispense: 180 capsule; Refill: 1  4. Familial multiple lipoprotein-type hyperlipidemia Chronic.  Controlled.  Stable.  Continue simvastatin 40 mg 1 tablet daily. - simvastatin (ZOCOR) 40 MG tablet; Take 1 tablet (40 mg total) by mouth daily.  Dispense: 90 tablet; Refill: 1  5. 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  Controlled.  Stable.  Continue alprazolam 0.5 mg tablet once a day for panic concerns.  6.  Urethritis New onset.  Persistent.  Stable.  No frequency urgency I think this more is of a ureteritis and we will treat with Macrobid 100 mg twice a day for 3 days. - nitrofurantoin, macrocrystal-monohydrate, (MACROBID) 100 MG capsule; Take 1 capsule (100 mg total) by mouth 2 (two) times daily.  Dispense: 6 capsule; Refill: 0

## 2020-05-22 NOTE — Addendum Note (Signed)
Addended by: Everitt Amber on: 05/22/2020 10:34 AM   Modules accepted: Orders

## 2020-05-23 LAB — CBC WITH DIFFERENTIAL/PLATELET
Basophils Absolute: 0.1 10*3/uL (ref 0.0–0.2)
Basos: 1 %
EOS (ABSOLUTE): 0.1 10*3/uL (ref 0.0–0.4)
Eos: 2 %
Hematocrit: 40.6 % (ref 34.0–46.6)
Hemoglobin: 13.2 g/dL (ref 11.1–15.9)
Immature Grans (Abs): 0 10*3/uL (ref 0.0–0.1)
Immature Granulocytes: 0 %
Lymphocytes Absolute: 1.8 10*3/uL (ref 0.7–3.1)
Lymphs: 35 %
MCH: 27.3 pg (ref 26.6–33.0)
MCHC: 32.5 g/dL (ref 31.5–35.7)
MCV: 84 fL (ref 79–97)
Monocytes Absolute: 0.5 10*3/uL (ref 0.1–0.9)
Monocytes: 9 %
Neutrophils Absolute: 2.7 10*3/uL (ref 1.4–7.0)
Neutrophils: 53 %
Platelets: 265 10*3/uL (ref 150–450)
RBC: 4.83 x10E6/uL (ref 3.77–5.28)
RDW: 13.1 % (ref 11.7–15.4)
WBC: 5.2 10*3/uL (ref 3.4–10.8)

## 2020-05-30 ENCOUNTER — Ambulatory Visit: Payer: BC Managed Care – PPO | Admitting: Family Medicine

## 2020-06-19 ENCOUNTER — Ambulatory Visit (INDEPENDENT_AMBULATORY_CARE_PROVIDER_SITE_OTHER): Payer: BC Managed Care – PPO

## 2020-06-19 ENCOUNTER — Other Ambulatory Visit: Payer: Self-pay

## 2020-06-19 ENCOUNTER — Ambulatory Visit
Admission: RE | Admit: 2020-06-19 | Discharge: 2020-06-19 | Disposition: A | Discharge status: Home / Self Care | Payer: BC Managed Care – PPO | Source: Ambulatory Visit | Attending: Emergency Medicine | Admitting: Emergency Medicine

## 2020-06-19 VITALS — BP 127/76 | HR 79 | Temp 98.3°F | Resp 18

## 2020-06-19 DIAGNOSIS — M79671 Pain in right foot: Secondary | ICD-10-CM | POA: Diagnosis not present

## 2020-06-19 DIAGNOSIS — R2241 Localized swelling, mass and lump, right lower limb: Secondary | ICD-10-CM | POA: Diagnosis not present

## 2020-06-19 DIAGNOSIS — R600 Localized edema: Secondary | ICD-10-CM | POA: Diagnosis not present

## 2020-06-19 NOTE — Discharge Instructions (Addendum)
X-ray today did not show the presence of any broken bones.  I think that your swelling in your foot is a result of the calcium channel blocker that you are on for your blood pressure.  Take the hydrochlorothiazide that you have at home once daily to see if it helps with the swelling in your foot.  If your symptoms do not improve return for reevaluation or see your primary care provider.

## 2020-06-19 NOTE — ED Provider Notes (Signed)
[facility]    CSN: [id number] Arrival date & time: 06/19/20  1544      History   Chief Complaint Chief Complaint  Patient presents with  . Foot Swelling    Right Foot     HPI Sherri Matthews is a 55 y.o. female.   HPI   [age] year old female here for evaluation of right foot pain and swelling.  Patient reports that she has been experiencing right foot pain and swelling for the last couple of weeks.  She states the first she noticed was some pain in the inner aspect of her right thigh that felt like an intense cramp which eventually released and then she developed a swelling in her right foot.  Patient is complaining of pain and swelling over the distal aspect of the midfoot into her toes and is complaining about feeling tender there as well.  She denies any injury, numbness, tingling, pain in her calf, chest pain, shortness of breath.  Patient has no history of DVT.  Past Medical History:  Diagnosis Date  . Anxiety   . AV node dysfunction    tachycardia sees Dr. Lady GaryFath  . Cervical disc disease    degenerative disk   . Chronic abdominal pain   . Heart murmur   . Hyperlipidemia   . Hypertension   . IBS (irritable bowel syndrome)   . Mitral valve prolapse   . Palpitations   . Sleep apnea    possible but not tested    Patient Active Problem List   Diagnosis Date Noted  . Chest pain 06/19/2016  . Abnormal EKG 06/19/2016  . Menopause 01/28/2016  . Essential hypertension 05/15/2015  . Hyperlipidemia 05/15/2015  . Gastritis   . 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 Heart murmur 07/16/2014  . Gastroesophageal reflux disease 07/16/2014  . H/O: osteoarthritis 07/16/2014    Past Surgical History:  Procedure Laterality Date  . APPENDECTOMY  2008  . CARDIAC CATHETERIZATION N/A 09/12/2015   Procedure: Left  Heart Cath and Coronary Angiography;  Surgeon: Marcina MillardAlexander Paraschos, MD;  Location: [facility];  Service: Cardiovascular;  Laterality: N/A;  . ESOPHAGOGASTRODUODENOSCOPY (EGD) WITH PROPOFOL N/A 04/18/2015   Procedure: ESOPHAGOGASTRODUODENOSCOPY (EGD) WITH PROPOFOL;  Surgeon: Midge Miniumarren Wohl, MD;  [facility];  Service: Endoscopy;  Laterality: N/A;  . HERNIA REPAIR      OB History   No obstetric history on file.      Home Medications    Prior to Admission medications   Medication Sig Start Date End Date Taking? Authorizing Provider  ALPRAZolam Prudy Feeler(XANAX) 0.5 MG tablet TAKE ONE TABLET BY MOUTH ONCE DAILY AS NEEDED 11/30/19   Duanne LimerickJones, Deanna C, MD  amLODipine (NORVASC) 10 MG tablet Take 0.5 tablets (5 mg total) by mouth daily. 05/22/20 05/22/21  Duanne LimerickJones, Deanna C, MD  Ascorbic Acid (VITAMIN C) 100 MG CHEW Chew 1 tablet by mouth daily.    [provider]  B Complex-C (SUPER B COMPLEX PO) Take 1 tablet by mouth daily.    [provider]  ferrous sulfate 325 (65 FE) MG tablet Take 1 tablet (325 mg total) by mouth daily with breakfast. 05/22/20   Duanne LimerickJones, Deanna C, MD  ibuprofen (ADVIL,MOTRIN) 200 MG tablet Take 200 mg by mouth every 6 (six) hours as needed.    [provider]  Magnesium 250 MG TABS Take 1 tablet by mouth  daily.    [provider]  metoprolol tartrate (LOPRESSOR) 25 MG tablet Take 0.5 tablets (12.5 mg total) by mouth 2 (two) times daily. 05/22/20   Duanne Limerick, MD  Nutritional Supplements (VITAMIN D BOOSTER PO) Take 1 capsule by mouth daily.    [provider]  omeprazole (PRILOSEC) 40 MG capsule TAKE 1 CAPSULE BY MOUTH TWICE DAILY. NEED APPOINTMENT FOR FURTHER REFILLS. 05/22/20   Duanne Limerick, MD  simvastatin (ZOCOR) 40 MG tablet Take 1 tablet (40 mg total) by mouth daily. 05/22/20   Duanne Limerick, MD    Family History Family History  Problem Relation Age of Onset  . Atrial fibrillation Mother   . Hypertension  Mother   . Diabetes Mother   . Atrial fibrillation Father   . Breast cancer Maternal Aunt        mat great aunt    Social History Social History   Tobacco Use  . Smoking status: Never Smoker  . Smokeless tobacco: Never Used  Vaping Use  . Vaping Use: Never used  Substance Use Topics  . Alcohol use: No    Alcohol/week: 0.0 standard drinks  . Drug use: No     Allergies   Oxycodone-acetaminophen, Oxycodone-acetaminophen, Ciprofloxacin, Dilaudid [hydromorphone hcl], Fenofibrate, Sertraline, and Vicodin [hydrocodone-acetaminophen]   Review of Systems Review of Systems  Constitutional: Negative for fever.  Respiratory: Negative for shortness of breath.   Cardiovascular: Positive for leg swelling. Negative for chest pain and palpitations.  Musculoskeletal: Positive for arthralgias, joint swelling and myalgias.  Skin: Positive for color change.  Neurological: Negative for weakness and numbness.  Hematological: Negative.   Psychiatric/Behavioral: Negative.      Physical Exam Triage Vital Signs ED Triage Vitals  Enc Vitals Group     BP 06/19/20 1622 127/76     Pulse Rate 06/19/20 1622 79     Resp 06/19/20 1622 18     Temp 06/19/20 1622 98.3 F (36.8 C)     Temp Source 06/19/20 1622 Oral     SpO2 06/19/20 1622 100 %     Weight --      Height --      Head Circumference --      Peak Flow --      Pain Score 06/19/20 1620 6     Pain Loc --      Pain Edu? --      Excl. in GC? --    No data found.  Updated Vital Signs BP 127/76 (BP Location: Left Arm)   Pulse 79   Temp 98.3 F (36.8 C) (Oral)   Resp 18   LMP 04/18/2017 (Approximate)   SpO2 100%   Visual Acuity Right Eye Distance:   Left Eye Distance:   Bilateral Distance:    Right Eye Near:   Left Eye Near:    Bilateral Near:     Physical Exam Vitals and nursing note reviewed.  Constitutional:      General: She is not in acute distress.    Appearance: Normal appearance. She is normal weight. She is  not ill-appearing.  HENT:     Head: Normocephalic and atraumatic.  Musculoskeletal:        General: Swelling and tenderness present. No deformity. Normal range of motion.  Skin:    General: Skin is warm and dry.     Capillary Refill: Capillary refill takes less than 2 seconds.     Findings: No bruising.  Neurological:     General: No  focal deficit present.     Mental Status: She is alert and oriented to person, place, and time.  Psychiatric:        Mood and Affect: Mood normal.        Behavior: Behavior normal.        Thought Content: Thought content normal.        Judgment: Judgment normal.      UC Treatments / Results  Labs (all labs ordered are listed, but only abnormal results are displayed) Labs Reviewed - No data to display  EKG   Radiology DG Foot Complete Right  Result Date: 06/19/2020 CLINICAL DATA:  Pain and swelling in the right foot over the last 2 weeks. EXAM: RIGHT FOOT COMPLETE - 3+ VIEW COMPARISON:  None FINDINGS: Type 2 accessory navicular. Substantial degenerative arthropathy at the first MTP joint with loss of articular space, spurring, and mild subcortical cyst formation along the medial base of the proximal phalanx great toe. No marginal erosion characteristic of gout. Lisfranc joint alignment normal. Large plantar calcaneal spur. Dorsal subcutaneous edema observed in the foot at the level of the distal metatarsals and MTP joints. IMPRESSION: 1. Dorsal subcutaneous edema along the forefoot. 2. 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 3. Incidental type 2 accessory navicular. 4. Plantar calcaneal spur. Electronically Signed   By: Gaylyn Rong M.D.   On: 06/19/2020 17:51    Procedures Procedures (including critical care time)  Medications Ordered in UC Medications - No data to display  Initial Impression / Assessment and Plan / UC Course  I have reviewed the triage vital signs and the nursing notes.  Pertinent labs & imaging results that  were available during my care of the patient were reviewed by me and considered in my medical decision making (see chart for details).   Patient is a very pleasant 55 year old female here for evaluation of swelling and pain to her right foot.  Patient has swelling over the distal aspect of the midfoot extending into her toes that has been present for approximately 2 weeks.  She is unaware of any injury and has no risk factors for DVT.  Patient denies pain in her calf or numbness and tingling in her toes.  Physical exam reveals edema to the distal aspect of her midfoot extending into her toes that is free of erythema or ecchymosis.  Patient has full sensation in her distal toes as well as range of motion of her foot.  Patient reports that it is difficult to dorsiflex and plantarflex her toe digits due to the swelling.  Patient has no tender when palpating the arch of the foot, the proximal midfoot, the base of the fifth metatarsal, medial or lateral malleolus, calcaneus, or Achilles.  Patient's DP and PT pulses are 2+.  Patient does not have any tenderness in the calf and she has a negative Homans' sign.  Right calf measures 44 cm and left calf measures 45 cm.  Patient denies using any HRT, having long car rides, being on birth control, or being a smoker.  Patient was recently switched from HCTZ to amlodipine 5 months ago.  Suspect patient is having pedal edema as a side effect of her calcium channel blocker.  Will obtain radiograph of right foot to rule out stress fracture.  X-ray is negative for fracture.  Discharge patient home with a diagnosis of pedal edema, most likely secondary to her calcium channel blocker.  Patient states she has some hydrochlorothiazide at home and I will have  her use that once daily to see if it helps with her swelling in her foot.   Final Clinical Impressions(s) / UC Diagnoses   Final diagnoses:  Pedal edema     Discharge Instructions     X-ray today did not show the  presence of any broken bones.  I think that your swelling in your foot is a result of the calcium channel blocker that you are on for your blood pressure.  Take the hydrochlorothiazide that you have at home once daily to see if it helps with the swelling in your foot.  If your symptoms do not improve return for reevaluation or see your primary care provider.    ED Prescriptions    None     PDMP not reviewed this encounter.   Sherri Augusta, NP 06/19/20 1805

## 2020-06-19 NOTE — ED Triage Notes (Signed)
Pt is present today with right side foot swelling. Pt states that she noticed the swelling a couple weeks ago. Pt states that she first noticed the pain in her inner right thigh and the pain traveled down to her foot.

## 2020-08-04 ENCOUNTER — Other Ambulatory Visit: Payer: Self-pay | Admitting: Family Medicine

## 2020-08-04 DIAGNOSIS — Z1231 Encounter for screening mammogram for malignant neoplasm of breast: Secondary | ICD-10-CM

## 2020-08-11 ENCOUNTER — Ambulatory Visit
Admission: RE | Admit: 2020-08-11 | Discharge: 2020-08-11 | Disposition: A | Discharge status: Home / Self Care | Payer: BC Managed Care – PPO | Source: Ambulatory Visit | Attending: Family Medicine | Admitting: Family Medicine

## 2020-08-11 ENCOUNTER — Other Ambulatory Visit: Payer: Self-pay

## 2020-08-11 DIAGNOSIS — Z1231 Encounter for screening mammogram for malignant neoplasm of breast: Secondary | ICD-10-CM | POA: Insufficient documentation

## 2020-11-10 ENCOUNTER — Ambulatory Visit: Payer: BC Managed Care – PPO | Admitting: Family Medicine

## 2020-11-10 ENCOUNTER — Encounter: Payer: Self-pay | Admitting: Family Medicine

## 2020-11-10 ENCOUNTER — Other Ambulatory Visit: Payer: Self-pay

## 2020-11-10 VITALS — BP 110/62 | HR 76 | Ht 71.0 in | Wt 208.0 lb

## 2020-11-10 DIAGNOSIS — M5090 Cervical disc disorder, unspecified, unspecified cervical region: Secondary | ICD-10-CM | POA: Diagnosis not present

## 2020-11-10 DIAGNOSIS — E7849 Other hyperlipidemia: Secondary | ICD-10-CM

## 2020-11-10 DIAGNOSIS — I10 Essential (primary) hypertension: Secondary | ICD-10-CM | POA: Diagnosis not present

## 2020-11-10 DIAGNOSIS — K219 Gastro-esophageal reflux disease without esophagitis: Secondary | ICD-10-CM

## 2020-11-10 MED ORDER — IBUPROFEN 800 MG PO TABS: 800.0000 mg | ORAL_TABLET | Freq: Three times a day (TID) | ORAL | 0 refills | Status: DC | PRN

## 2020-11-10 MED ORDER — SIMVASTATIN 40 MG PO TABS: 40.0000 mg | ORAL_TABLET | Freq: Every day | ORAL | 1 refills | Status: DC

## 2020-11-10 MED ORDER — OMEPRAZOLE 40 MG PO CPDR: DELAYED_RELEASE_CAPSULE | ORAL | 1 refills | Status: DC

## 2020-11-10 MED ORDER — AMLODIPINE BESYLATE 10 MG PO TABS: 5.0000 mg | ORAL_TABLET | Freq: Every day | ORAL | 1 refills | Status: DC

## 2020-11-10 MED ORDER — CYCLOBENZAPRINE HCL 10 MG PO TABS
10.0000 mg | ORAL_TABLET | Freq: Every day | ORAL | 0 refills | Status: DC
Start: 2020-11-10 — End: 2021-04-10

## 2020-11-10 MED ORDER — METOPROLOL TARTRATE 25 MG PO TABS: 12.5000 mg | ORAL_TABLET | Freq: Two times a day (BID) | ORAL | 1 refills | Status: DC

## 2020-11-10 NOTE — Progress Notes (Signed)
Date:  11/10/2020   Name:  Sherri Matthews   DOB:  01/16/1966   MRN:  [id number]   Chief Complaint: Hypertension, Hyperlipidemia, and Gastroesophageal Reflux  Hypertension This is a chronic problem. The current episode started more than 1 year ago. The problem has been gradually improving since onset. The problem is controlled. Associated symptoms include neck pain. Pertinent negatives include no anxiety, blurred vision, chest pain, headaches, malaise/fatigue, orthopnea, palpitations, peripheral edema, PND, shortness of breath or sweats. There are no associated agents to hypertension. Risk factors for coronary artery disease include dyslipidemia. Past treatments include beta blockers and calcium channel blockers. The current treatment provides moderate improvement. There are no compliance problems.  There is no history of angina, kidney disease, CAD/MI, CVA, heart failure, left ventricular hypertrophy, PVD or retinopathy. There is no history of chronic renal disease, a hypertension causing med or renovascular disease.  Hyperlipidemia This is a chronic problem. The current episode started more than 1 year ago. The problem is controlled. Recent lipid tests were reviewed and are normal. She has no history of chronic renal disease or diabetes. Pertinent negatives include no chest pain, focal sensory loss, focal weakness, leg pain, myalgias or shortness of breath. Current antihyperlipidemic treatment includes statins. The current treatment provides moderate improvement of lipids. There are no compliance problems.  Risk factors for coronary artery disease include dyslipidemia and hypertension.  Gastroesophageal Reflux She reports no abdominal pain, no belching, no chest pain, no choking, no coughing, no dysphagia, no early satiety, no globus sensation, no heartburn, no hoarse voice, no nausea, no sore throat, no stridor or no tooth decay. This is a chronic problem. The current episode started more than 1  year ago. The problem occurs rarely. Pertinent negatives include no fatigue, melena, muscle weakness or weight loss. She has tried a PPI for the symptoms. The treatment provided significant relief.  Neck Pain  This is a chronic problem. The current episode started more than 1 year ago. The problem occurs every several days. The problem has been gradually improving. The pain is present in the occipital region, left side and right side. The quality of the pain is described as aching. The pain is at a severity of 9/10. The pain is moderate. Pertinent negatives include no chest pain, headaches, leg pain, paresis, weakness or weight loss. She has tried NSAIDs and muscle relaxants for the symptoms.   Lab Results  Component Value Date   CREATININE 0.82 05/17/2019   BUN 16 05/17/2019   NA 142 05/17/2019   K 4.3 05/17/2019   CL 104 05/17/2019   CO2 25 05/17/2019   Lab Results  Component Value Date   CHOL 195 05/17/2019   HDL 44 05/17/2019   LDLCALC 121 (H) 05/17/2019   TRIG 170 (H) 05/17/2019   CHOLHDL 4.6 (H) 02/24/2018   Lab Results  Component Value Date   TSH 0.910 11/01/2017   No results found for: HGBA1C Lab Results  Component Value Date   WBC 5.2 05/22/2020   HGB 13.2 05/22/2020   HCT 40.6 05/22/2020   MCV 84 05/22/2020   PLT 265 05/22/2020   Lab Results  Component Value Date   ALT 30 05/17/2019   AST 30 05/17/2019   ALKPHOS 112 05/17/2019   BILITOT 0.8 05/17/2019     Review of Systems  Constitutional:  Negative for fatigue, malaise/fatigue and weight loss.  HENT:  Negative for hoarse voice and sore throat.   Eyes:  Negative for blurred vision.  Respiratory:  Negative for cough, choking and shortness of breath.   Cardiovascular:  Negative for chest pain, palpitations, orthopnea and PND.  Gastrointestinal:  Negative for abdominal pain, dysphagia, heartburn, melena and nausea.  Musculoskeletal:  Positive for neck pain. Negative for myalgias and muscle weakness.   Neurological:  Negative for focal weakness, weakness and headaches.   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     Vicodin [Hydrocodone-Acetaminophen] Other (See Comments)    Heart raced.    Past Surgical History:  Procedure Laterality Date   APPENDECTOMY  2008   CARDIAC CATHETERIZATION N/A 09/12/2015   Procedure: Left Heart Cath and Coronary Angiography;  Surgeon: Marcina Millard, MD;  Location: [facility];  Service: Cardiovascular;  Laterality: N/A;   ESOPHAGOGASTRODUODENOSCOPY (EGD) WITH PROPOFOL N/A 04/18/2015   Procedure: ESOPHAGOGASTRODUODENOSCOPY (EGD) WITH PROPOFOL;  Surgeon: Midge Minium, MD;  Location: [facility];  Service: Endoscopy;  Laterality: N/A;   HERNIA REPAIR      Social History   Tobacco Use   Smoking status: Never   Smokeless tobacco: Never  Vaping Use   Vaping Use: Never used  Substance Use Topics   Alcohol use: No    Alcohol/week: 0.0 standard drinks   Drug use: No     Medication list has been reviewed and updated.  Current Meds  Medication Sig   ALPRAZolam (XANAX) 0.5 MG tablet TAKE ONE TABLET BY MOUTH ONCE DAILY AS NEEDED   amLODipine (NORVASC) 10 MG tablet Take 0.5 tablets (5 mg total) by mouth daily.    Ascorbic Acid (VITAMIN C) 100 MG CHEW Chew 1 tablet by mouth daily.   B Complex-C (SUPER B COMPLEX PO) Take 1 tablet by mouth daily.   ferrous sulfate 325 (65 FE) MG tablet Take 1 tablet (325 mg total) by mouth daily with breakfast.   ibuprofen (ADVIL,MOTRIN) 200 MG tablet Take 200 mg by mouth every 6 (six) hours as needed.   Magnesium 250 MG TABS Take 1 tablet by mouth daily.   metoprolol tartrate (LOPRESSOR) 25 MG tablet Take 0.5 tablets (12.5 mg total) by mouth 2 (two) times daily.   Nutritional Supplements (VITAMIN D BOOSTER PO) Take 1 capsule by mouth daily.   omeprazole (PRILOSEC) 40 MG capsule TAKE 1 CAPSULE BY MOUTH TWICE DAILY. NEED APPOINTMENT FOR FURTHER REFILLS.   simvastatin (ZOCOR) 40 MG tablet Take 1 tablet (40 mg total) by mouth daily.    PHQ 2/9 Scores 05/22/2020 11/30/2019 05/17/2019 11/25/2016  PHQ - 2 Score 0 0 0 0  PHQ- 9 Score 2 2 8 2     GAD 7 : Generalized Anxiety Score 05/22/2020 11/30/2019 05/17/2019 09/20/2018  Nervous, Anxious, on Edge 1 0 1 0  Control/stop worrying 0 0 0 0  Worry too much - different things 0 0 0  0  Trouble relaxing 0 0 0 0  Restless 0 0 1 0  Easily annoyed or irritable 2 0 0 1  Afraid - awful might happen 0 0 0 0  Total GAD 7 Score 3 0 2 1  Anxiety Difficulty Not difficult at all - Not difficult at all Not difficult at all    BP Readings from Last 3 Encounters:  06/19/20 127/76  05/22/20 102/64  12/10/19 140/82    Physical Exam Vitals and nursing note reviewed.  Constitutional:      Appearance: She is well-developed.  HENT:     Head: Normocephalic.     Right Ear: Tympanic membrane, ear canal and external ear normal.     Left Ear: Tympanic membrane, ear canal and external ear normal.  Eyes:     General: Lids are everted, no foreign bodies appreciated. No scleral icterus.       Left eye: No foreign body or hordeolum.     Conjunctiva/sclera: Conjunctivae normal.     Right eye: Right conjunctiva is not injected.     Left eye: Left  conjunctiva is not injected.     Pupils: Pupils are equal, round, and reactive to light.  Neck:     Thyroid: No thyromegaly.     Vascular: No JVD.     Trachea: No tracheal deviation.  Cardiovascular:     Rate and Rhythm: Normal rate and regular rhythm.     Heart sounds: Normal heart sounds. No murmur heard.   No friction rub. No gallop.  Pulmonary:     Effort: Pulmonary effort is normal. No respiratory distress.     Breath sounds: Normal breath sounds. No wheezing or rales.  Abdominal:     General: Bowel sounds are normal.     Palpations: Abdomen is soft. There is no mass.     Tenderness: There is no abdominal tenderness. There is no guarding or rebound.  Musculoskeletal:        General: No tenderness. Normal range of motion.     Cervical back: Normal range of motion and neck supple. Spasms present.  Lymphadenopathy:     Cervical: No cervical adenopathy.  Skin:    General: Skin is warm.     Findings: No rash.  Neurological:     Mental Status: She is alert and oriented to person, place, and time.     Cranial Nerves: No cranial nerve deficit.     Deep Tendon Reflexes: Reflexes normal.  Psychiatric:        Mood and Affect: Mood is not anxious or depressed.    Wt Readings from Last 3 Encounters:  05/22/20 224 lb (101.6 kg)  12/10/19 219 lb (99.3 kg)  11/30/19 219 lb (99.3 kg)    LMP 04/18/2017 (Approximate)   Assessment and Plan:  1. Essential (primary) hypertension Chronic.  Controlled.  Stable.  Blood pressure today is 110/62.  Continue amlodipine 10 mg once a day and metoprolol 25 mg 1/2 tablet twice a day. Recheck patient in 6 months - amLODipine (NORVASC) 10 MG tablet; Take 0.5 tablets (5 mg total) by mouth daily.  Dispense: 90 tablet; Refill: 1 - metoprolol tartrate (LOPRESSOR) 25 MG tablet; Take 0.5 tablets (12.5 mg total) by mouth 2 (two) times daily.  Dispense: 45 tablet; Refill: 1  2. Gastroesophageal reflux disease Chronic.  Controlled.  Stable.  Continue  omeprazole 40 mg twice a day.  We will recheck patient in 6 months. - omeprazole (PRILOSEC) 40 MG capsule; TAKE 1 CAPSULE BY  MOUTH TWICE DAILY. NEED APPOINTMENT FOR FURTHER REFILLS.  Dispense: 180 capsule; Refill: 1  3. Familial multiple lipoprotein-type hyperlipidemia Chronic.  Controlled.  Stable.  Continue simvastatin 40 mg once a day. - simvastatin (ZOCOR) 40 MG tablet; Take 1 tablet (40 mg total) by mouth daily.  Dispense: 90 tablet; Refill: 1  4. Cervical disc disease Chronic.  But new exacerbation with increased pain particularly at night with difficulty getting situated for sleep at night.  Patient is on 200 mg ibuprofen taking 4 at a time.  We will initiate ibuprofen 800 mg every 8 hours as needed for pain and cyclobenzaprine 10 mg nightly.  We will have patient return for reevaluation by Dr. Ashley Royalty for suspected cervical disc disease as well upon review of cervical x-ray on 04/14/2018 - ibuprofen (ADVIL) 800 MG tablet; Take 1 tablet (800 mg total) by mouth every 8 (eight) hours as needed.  Dispense: 90 tablet; Refill: 0 - cyclobenzaprine (FLEXERIL) 10 MG tablet; Take 1 tablet (10 mg total) by mouth at bedtime.  Dispense: 30 tablet; Refill: 0

## 2020-11-11 ENCOUNTER — Encounter: Payer: Self-pay | Admitting: Family Medicine

## 2020-11-20 ENCOUNTER — Ambulatory Visit: Payer: BC Managed Care – PPO | Admitting: Family Medicine

## 2020-11-20 ENCOUNTER — Encounter: Payer: Self-pay | Admitting: Family Medicine

## 2020-11-20 ENCOUNTER — Other Ambulatory Visit: Payer: Self-pay

## 2020-11-20 VITALS — BP 102/64 | HR 58 | Ht 71.0 in | Wt 205.0 lb

## 2020-11-20 DIAGNOSIS — M47812 Spondylosis without myelopathy or radiculopathy, cervical region: Secondary | ICD-10-CM | POA: Insufficient documentation

## 2020-11-20 DIAGNOSIS — M9901 Segmental and somatic dysfunction of cervical region: Secondary | ICD-10-CM | POA: Diagnosis not present

## 2020-11-20 MED ORDER — DICLOFENAC SODIUM 75 MG PO TBEC: 75.0000 mg | DELAYED_RELEASE_TABLET | Freq: Two times a day (BID) | ORAL | 0 refills | Status: DC

## 2020-11-20 NOTE — Assessment & Plan Note (Signed)
Patient with history of acute on chronic atraumatic cervical neck pain, symmetric, denies any symptoms into the upper extremities.  She has previously received what she describes as a trigger point injection to the neck without benefit, is unable to tolerate oral steroids due to adverse effects, and has been treating this with relative rest, ibuprofen and heat.  Physical examination reveals limited rightward torsion, otherwise full, although painful with maximal flow noted during extension and rightward torsion, sensorimotor intact in bilateral upper extremities, significant paraspinal musculature tightness and spasm in the levator scapula, rhomboids, upper trapezius regions bilaterally, Spurling's test is negative bilaterally.  Shoulder examinations negative bilaterally.  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  She is to initiate scheduled diclofenac in place of ibuprofen, as needed nightly cyclobenzaprine, and start formal physical therapy.  She is to return in 5 weeks for clinical reevaluation, if suboptimal progress noted, can consider incorporation of duloxetine, transition of pharmacotherapy, advanced imaging.

## 2020-11-20 NOTE — Assessment & Plan Note (Signed)
See additional assessment(s) for plan details. 

## 2020-11-20 NOTE — Patient Instructions (Signed)
-   Start diclofenac, dose every a.m. and every p.m. with food, stop ibuprofen - Can dose nightly cyclobenzaprine on an as-needed basis for muscle tightness pain - Can continue moist heat to the areas of muscle tightness - Referral coordinator will contact you to schedule physical therapy - Perform activity as tolerated using neck symptoms as a guide - Return for follow-up in 5 weeks, contact her office for questions between now and then

## 2020-11-20 NOTE — Progress Notes (Signed)
Primary Care / Sports Medicine Office Visit  Patient Information:  Patient ID: Sherri Matthews, female DOB: 11-20-1965 Age: 55 y.o. MRN: [id number]   Sherri Matthews is a pleasant 55 y.o. female presenting with the following:  Chief Complaint  Patient presents with   New Patient (Initial Visit)   Neck Pain    X3 years; X-Ray 04/14/2018; worse when laying down at night (patient also has OSA) and worse with certain movements; had used heat, ice, and prescription ibuprofen with no relief; was also prescribed cyclobenzaprine 11/10/20 but has not taken yet; 4/10 pain    Review of Systems pertinent details above   Patient Active Problem List   Diagnosis Date Noted   Cervical spondylosis 11/20/2020   Segmental and somatic dysfunction of cervical region 11/20/2020   Obstructive sleep apnea 12/25/2019   Intractable migraine with aura without status migrainosus 06/21/2016   Chest pain 06/19/2016   Abnormal EKG 06/19/2016   Menopause 01/28/2016   Heart palpitations 10/09/2015   SOB (shortness of breath) 09/24/2015   Diverticulitis 06/04/2015   Lower back pain 06/04/2015   SVT (supraventricular tachycardia) (HCC) 06/04/2015   Essential hypertension 05/15/2015   Hyperlipidemia 05/15/2015   Gastritis    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 Lady Gary   Cervical disc disease    degenerative disk    Chronic abdominal pain    Heart murmur    Hyperlipidemia    Hypertension    IBS (irritable bowel syndrome)    Mitral valve prolapse    Palpitations    Sleep apnea    possible but not tested   Outpatient Encounter Medications as of  11/20/2020  Medication Sig   ALPRAZolam (XANAX) 0.5 MG tablet TAKE ONE TABLET BY MOUTH ONCE DAILY AS NEEDED   amLODipine (NORVASC) 10 MG tablet Take 0.5 tablets (5 mg total) by mouth daily.   Ascorbic Acid (VITAMIN C) 100 MG CHEW Chew 1 tablet by mouth daily.   B Complex-C (SUPER B COMPLEX PO) Take 1 tablet by mouth daily.   diclofenac (VOLTAREN) 75 MG EC tablet Take 1 tablet (75 mg total) by mouth 2 (two) times daily.   ferrous sulfate 325 (65 FE) MG tablet Take 1 tablet (325 mg total) by mouth daily with breakfast.   ibuprofen (ADVIL) 800 MG tablet Take 1 tablet (800 mg total) by mouth every 8 (eight) hours as needed.   Magnesium 250 MG TABS Take 1 tablet by mouth daily.   metoprolol tartrate (LOPRESSOR) 25 MG tablet Take 0.5 tablets (12.5 mg total) by mouth 2 (two) times daily.   Nutritional Supplements (VITAMIN D BOOSTER PO) Take 1 capsule by mouth daily.   omeprazole (PRILOSEC) 40 MG capsule TAKE 1 CAPSULE BY MOUTH TWICE DAILY. NEED APPOINTMENT FOR FURTHER REFILLS.   simvastatin (ZOCOR) 40 MG tablet Take 1 tablet (40 mg total) by mouth daily.   cyclobenzaprine (FLEXERIL) 10 MG tablet Take 1 tablet (10 mg total) by mouth at bedtime. (Patient not taking: Reported on 11/20/2020)   No facility-administered encounter medications on file as of 11/20/2020.   Past Surgical History:  Procedure Laterality Date   APPENDECTOMY  2008   CARDIAC CATHETERIZATION  N/A 09/12/2015   Procedure: Left Heart Cath and Coronary Angiography;  Surgeon: Marcina Millard, MD;  Location: [facility];  Service: Cardiovascular;  Laterality: N/A;   ESOPHAGOGASTRODUODENOSCOPY (EGD) WITH PROPOFOL N/A 04/18/2015   Procedure: ESOPHAGOGASTRODUODENOSCOPY (EGD) WITH PROPOFOL;  Surgeon: Midge Minium, MD;  Location: [facility];  Service: Endoscopy;  Laterality: N/A;   HERNIA REPAIR      Vitals:   11/20/20 1450  BP: 102/64  Pulse: (!) 58  SpO2: 98%   Vitals:   11/20/20 1450  Weight: 205 lb (93 kg)   Height: 5\' 11"  (1.803 m)   Body mass index is 28.59 kg/m.  No results found.   Independent interpretation of notes and tests performed by another provider:   Independent interpretation of cervical spine films dated 04/14/2018 reveals straightening of the expected cervical lordosis, significant intervertebral narrowing at C3-4, secondarily at C6-7 where there is anterior endplate osteophyte formation, facet hypertrophy and sclerosis noted at C3-C5, right foraminal narrowing at multiple levels, minimally noted on the left, no acute osseous process identified  Procedures performed:   None  Pertinent History, Exam, Impression, and Recommendations:   Cervical spondylosis Patient with history of acute on chronic atraumatic cervical neck pain, symmetric, denies any symptoms into the upper extremities.  She has previously received what she describes as a trigger point injection to the neck without benefit, is unable to tolerate oral steroids due to adverse effects, and has been treating this with relative rest, ibuprofen and heat.  Physical examination reveals limited rightward torsion, otherwise full, although painful with maximal flow noted during extension and rightward torsion, sensorimotor intact in bilateral upper extremities, significant paraspinal musculature tightness and spasm in the levator scapula, rhomboids, upper trapezius regions bilaterally, Spurling's test is negative bilaterally.  Shoulder examinations negative bilaterally.  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  She is to initiate scheduled diclofenac in place of ibuprofen, as needed nightly cyclobenzaprine, and start formal physical therapy.  She is to return in 5 weeks for clinical reevaluation, if suboptimal progress noted, can consider incorporation of duloxetine, transition of pharmacotherapy,  advanced imaging.  Segmental and somatic dysfunction of cervical region See additional assessment(s) for plan details.   Orders & Medications Meds ordered this encounter  Medications   diclofenac (VOLTAREN) 75 MG EC tablet    Sig: Take 1 tablet (75 mg total) by mouth 2 (two) times daily.    Dispense:  70 tablet    Refill:  0   Orders Placed This Encounter  Procedures   Ambulatory referral to Physical Therapy     Return in about 5 weeks (around 12/25/2020).     13/04/2020, MD   Primary Care Sports Medicine [facility]

## 2020-12-02 ENCOUNTER — Ambulatory Visit: Payer: BC Managed Care – PPO | Admitting: Physical Therapy

## 2020-12-09 ENCOUNTER — Ambulatory Visit: Payer: BC Managed Care – PPO | Admitting: Physical Therapy

## 2020-12-16 ENCOUNTER — Encounter: Payer: BC Managed Care – PPO | Admitting: Physical Therapy

## 2020-12-23 ENCOUNTER — Encounter: Payer: BC Managed Care – PPO | Admitting: Physical Therapy

## 2020-12-25 ENCOUNTER — Encounter: Payer: BC Managed Care – PPO | Admitting: Physical Therapy

## 2020-12-30 ENCOUNTER — Encounter: Payer: BC Managed Care – PPO | Admitting: Physical Therapy

## 2021-01-01 ENCOUNTER — Encounter: Payer: BC Managed Care – PPO | Admitting: Physical Therapy

## 2021-01-06 ENCOUNTER — Encounter: Payer: BC Managed Care – PPO | Admitting: Physical Therapy

## 2021-01-08 ENCOUNTER — Encounter: Payer: BC Managed Care – PPO | Admitting: Physical Therapy

## 2021-01-20 ENCOUNTER — Encounter: Payer: BC Managed Care – PPO | Admitting: Physical Therapy

## 2021-01-22 ENCOUNTER — Encounter: Payer: BC Managed Care – PPO | Admitting: Physical Therapy

## 2021-01-23 ENCOUNTER — Other Ambulatory Visit: Payer: Self-pay | Admitting: Family Medicine

## 2021-01-23 DIAGNOSIS — M47812 Spondylosis without myelopathy or radiculopathy, cervical region: Secondary | ICD-10-CM

## 2021-01-23 DIAGNOSIS — I10 Essential (primary) hypertension: Secondary | ICD-10-CM

## 2021-01-23 DIAGNOSIS — M9901 Segmental and somatic dysfunction of cervical region: Secondary | ICD-10-CM

## 2021-01-24 NOTE — Telephone Encounter (Signed)
Requested Prescriptions  Pending Prescriptions Disp Refills  . diclofenac (VOLTAREN) 75 MG EC tablet [Pharmacy Med Name: Diclofenac Sodium 75 MG Oral Tablet Delayed Release] 70 tablet 0    Sig: Take 1 tablet by mouth twice daily     Analgesics:  NSAIDS Failed - 01/23/2021  8:01 PM      Failed - Cr in normal range and within 360 days    Creatinine  Date Value Ref Range Status  03/09/2014 0.71 0.60 - 1.30 mg/dL Final   Creatinine, Ser  Date Value Ref Range Status  05/17/2019 0.82 0.57 - 1.00 mg/dL Final         Passed - HGB in normal range and within 360 days    Hemoglobin  Date Value Ref Range Status  05/22/2020 13.2 11.1 - 15.9 g/dL Final         Passed - Patient is not pregnant      Passed - Valid encounter within last 12 months    Recent Outpatient Visits          2 months ago Cervical spondylosis   [facility] Jerrol Banana, MD   2 months ago Essential (primary) hypertension   [facility] Duanne Limerick, MD   8 months ago Essential (primary) hypertension   [facility] Duanne Limerick, MD   1 year ago Essential (primary) hypertension   [facility] Duanne Limerick, MD   1 year ago Essential (primary) hypertension   [facility] Duanne Limerick, MD

## 2021-01-24 NOTE — Telephone Encounter (Signed)
Requested Prescriptions  Pending Prescriptions Disp Refills  . metoprolol tartrate (LOPRESSOR) 25 MG tablet [Pharmacy Med Name: Metoprolol Tartrate 25 MG Oral Tablet] 45 tablet 0    Sig: Take 1/2 (one-half) tablet by mouth twice daily     Cardiovascular:  Beta Blockers Passed - 01/23/2021  8:01 PM      Passed - Last BP in normal range    BP Readings from Last 1 Encounters:  11/20/20 102/64         Passed - Last Heart Rate in normal range    Pulse Readings from Last 1 Encounters:  11/20/20 (!) 58         Passed - Valid encounter within last 6 months    Recent Outpatient Visits          2 months ago Cervical spondylosis   [facility] Jerrol Banana, MD   2 months ago Essential (primary) hypertension   [facility] Duanne Limerick, MD   8 months ago Essential (primary) hypertension   [facility] Duanne Limerick, MD   1 year ago Essential (primary) hypertension   [facility] Duanne Limerick, MD   1 year ago Essential (primary) hypertension   [facility] Duanne Limerick, MD

## 2021-04-08 ENCOUNTER — Other Ambulatory Visit: Payer: Self-pay | Admitting: Family Medicine

## 2021-04-08 DIAGNOSIS — I10 Essential (primary) hypertension: Secondary | ICD-10-CM

## 2021-04-08 DIAGNOSIS — M9901 Segmental and somatic dysfunction of cervical region: Secondary | ICD-10-CM

## 2021-04-08 DIAGNOSIS — M5090 Cervical disc disorder, unspecified, unspecified cervical region: Secondary | ICD-10-CM

## 2021-04-08 DIAGNOSIS — M47812 Spondylosis without myelopathy or radiculopathy, cervical region: Secondary | ICD-10-CM

## 2021-04-09 ENCOUNTER — Telehealth: Payer: Self-pay

## 2021-04-09 NOTE — Telephone Encounter (Signed)
Called pt and could not leave a message d/t mailbox is full. She needs to complete PT or check back in with Dr. Zigmund Daniel by appt to discuss further options

## 2021-04-09 NOTE — Telephone Encounter (Signed)
Requested medications are due for refill today.  Yes - both  Requested medications are on the active medications list.  Yes - both  Last refill. Voltaren 01/24/2021 #70 /0 refills, IBU 11/10/2020 #90 0 refills  Future visit scheduled.   no  Notes to clinic.  Failed protocol d/t expired labs.    Requested Prescriptions  Pending Prescriptions Disp Refills   diclofenac (VOLTAREN) 75 MG EC tablet [Pharmacy Med Name: Diclofenac Sodium 75 MG Oral Tablet Delayed Release] 70 tablet 0    Sig: Take 1 tablet by mouth twice daily     Analgesics:  NSAIDS Failed - 04/08/2021  6:30 PM      Failed - Manual Review: Labs are only required if the patient has taken medication for more than 8 weeks.      Failed - Cr in normal range and within 360 days    Creatinine  Date Value Ref Range Status  03/09/2014 0.71 0.60 - 1.30 mg/dL Final   Creatinine, Ser  Date Value Ref Range Status  05/17/2019 0.82 0.57 - 1.00 mg/dL Final          Failed - eGFR is 30 or above and within 360 days    EGFR (African American)  Date Value Ref Range Status  03/09/2014 >60 >74m/min Final  07/05/2013 >60  Final   GFR calc Af Amer  Date Value Ref Range Status  05/17/2019 93 >59 mL/min/1.73 Final   EGFR (Non-African Amer.)  Date Value Ref Range Status  03/09/2014 >60 >621mmin Final    Comment:    eGFR values <6030min/1.73 m2 may be an indication of chronic kidney disease (CKD). Calculated eGFR, using the MRDR Study equation, is useful in  patients with stable renal function. The eGFR calculation will not be reliable in acutely ill patients when serum creatinine is changing rapidly. It is not useful in patients on dialysis. The eGFR calculation may not be applicable to patients at the low and high extremes of body sizes, pregnant women, and vegetarians.   07/05/2013 >60  Final    Comment:    eGFR values <53m68mn/1.73 m2 may be an indication of chronic kidney disease (CKD). Calculated eGFR is useful in  patients with stable renal function. The eGFR calculation will not be reliable in acutely ill patients when serum creatinine is changing rapidly. It is not useful in  patients on dialysis. The eGFR calculation may not be applicable to patients at the low and high extremes of body sizes, pregnant women, and vegetarians.    GFR calc non Af Amer  Date Value Ref Range Status  05/17/2019 80 >59 mL/min/1.73 Final          Passed - HGB in normal range and within 360 days    Hemoglobin  Date Value Ref Range Status  05/22/2020 13.2 11.1 - 15.9 g/dL Final          Passed - PLT in normal range and within 360 days    Platelets  Date Value Ref Range Status  05/22/2020 265 150 - 450 x10E3/uL Final          Passed - HCT in normal range and within 360 days    Hematocrit  Date Value Ref Range Status  05/22/2020 40.6 34.0 - 46.6 % Final          Passed - Patient is not pregnant      Passed - Valid encounter within last 12 months    Recent Outpatient Visits  4 months ago Cervical spondylosis  ° [facility] Matthews, Jason J, MD  ° 5 months ago Essential (primary) hypertension  ° [facility] Jones, Deanna C, MD  ° 10 months ago Essential (primary) hypertension  ° [facility] Jones, Deanna C, MD  ° 1 year ago Essential (primary) hypertension  ° [facility] Jones, Deanna C, MD  ° 1 year ago Essential (primary) hypertension  ° [facility] Jones, Deanna C, MD  ° °  °  ° °  °  °  ° ibuprofen (ADVIL) 800 MG tablet [Pharmacy Med Name: Ibuprofen 800 MG Oral Tablet] 90 tablet 0  °  Sig: TAKE 1 TABLET BY MOUTH EVERY 8 HOURS AS NEEDED  °  ° Analgesics:  NSAIDS Failed - 04/08/2021  6:30 PM  °  °  Failed - Manual Review: Labs are only required if the patient has taken medication for more than 8 weeks.  °  °  Failed - Cr in normal range and within 360 days  °  Creatinine  °Date Value Ref Range Status  °03/09/2014 0.71 0.60 - 1.30 mg/dL Final   ° °Creatinine, Ser  °Date Value Ref Range Status  °05/17/2019 0.82 0.57 - 1.00 mg/dL Final  °  °  °  °  Failed - eGFR is 30 or above and within 360 days  °  EGFR (African American)  °Date Value Ref Range Status  °03/09/2014 >60 >60mL/min Final  °07/05/2013 >60  Final  ° °GFR calc Af Amer  °Date Value Ref Range Status  °05/17/2019 93 >59 mL/min/1.73 Final  ° °EGFR (Non-African Amer.)  °Date Value Ref Range Status  °03/09/2014 >60 >60mL/min Final  °  Comment:  °  eGFR values <60mL/min/1.73 m2 may be an indication of chronic °kidney disease (CKD). °Calculated eGFR, using the MRDR Study equation, is useful in  °patients with stable renal function. °The eGFR calculation will not be reliable in acutely ill patients °when serum creatinine is changing rapidly. It is not useful in °patients on dialysis. The eGFR calculation may not be applicable °to patients at the low and high extremes of body sizes, pregnant °women, and vegetarians. °  °07/05/2013 >60  Final  °  Comment:  °  eGFR values <60mL/min/1.73 m2 may be an indication of chronic °kidney disease (CKD). °Calculated eGFR is useful in patients with stable renal function. °The eGFR calculation will not be reliable in acutely ill patients °when serum creatinine is changing rapidly. It is not useful in  °patients on dialysis. The eGFR calculation may not be applicable °to patients at the low and high extremes of body sizes, pregnant °women, and vegetarians. °  ° °GFR calc non Af Amer  °Date Value Ref Range Status  °05/17/2019 80 >59 mL/min/1.73 Final  °  °  °  °  Passed - HGB in normal range and within 360 days  °  Hemoglobin  °Date Value Ref Range Status  °05/22/2020 13.2 11.1 - 15.9 g/dL Final  °  °  °  °  Passed - PLT in normal range and within 360 days  °  Platelets  °Date Value Ref Range Status  °05/22/2020 265 150 - 450 x10E3/uL Final  °  °  °  °  Passed - HCT in normal range and within 360 days  °  Hematocrit  °Date Value Ref Range Status  °05/22/2020 40.6 34.0 -  46.6 % Final  °  °  °  °  Passed -   Patient is not pregnant  °  °  Passed - Valid encounter within last 12 months  °  Recent Outpatient Visits   ° °      ° 4 months ago Cervical spondylosis  ° [facility] Matthews, Jason J, MD  ° 5 months ago Essential (primary) hypertension  ° [facility] Jones, Deanna C, MD  ° 10 months ago Essential (primary) hypertension  ° [facility], Deanna C, MD  ° 1 year ago Essential (primary) hypertension  ° [facility], Deanna C, MD  ° 1 year ago Essential (primary) hypertension  ° [facility] Jones, Deanna C, MD  ° °  °  ° °  °  °  °Signed Prescriptions Disp Refills  ° metoprolol tartrate (LOPRESSOR) 25 MG tablet 45 tablet 0  °  Sig: Take 1/2 (one-half) tablet by mouth twice daily  °  ° Cardiovascular:  Beta Blockers Passed - 04/08/2021  6:30 PM  °  °  Passed - Last BP in normal range  °  BP Readings from Last 1 Encounters:  °11/20/20 102/64  °  °  °  °  Passed - Last Heart Rate in normal range  °  Pulse Readings from Last 1 Encounters:  °11/20/20 (!) 58  °  °  °  °  Passed - Valid encounter within last 6 months  °  Recent Outpatient Visits   ° °      ° 4 months ago Cervical spondylosis  ° [facility] Matthews, Jason J, MD  ° 5 months ago Essential (primary) hypertension  ° [facility] Jones, Deanna C, MD  ° 10 months ago Essential (primary) hypertension  ° [facility] Jones, Deanna C, MD  ° 1 year ago Essential (primary) hypertension  ° [facility] Jones, Deanna C, MD  ° 1 year ago Essential (primary) hypertension  ° [facility] Jones, Deanna C, MD  ° °  °  ° °  °  °  °  °

## 2021-04-09 NOTE — Telephone Encounter (Signed)
Requested Prescriptions  Pending Prescriptions Disp Refills   metoprolol tartrate (LOPRESSOR) 25 MG tablet [Pharmacy Med Name: Metoprolol Tartrate 25 MG Oral Tablet] 45 tablet 0    Sig: Take 1/2 (one-half) tablet by mouth twice daily     Cardiovascular:  Beta Blockers Passed - 04/08/2021  6:30 PM      Passed - Last BP in normal range    BP Readings from Last 1 Encounters:  11/20/20 102/64         Passed - Last Heart Rate in normal range    Pulse Readings from Last 1 Encounters:  11/20/20 (!) 58         Passed - Valid encounter within last 6 months    Recent Outpatient Visits          4 months ago Cervical spondylosis   [facility] Montel Culver, MD   5 months ago Essential (primary) hypertension   [facility] Juline Patch, MD   10 months ago Essential (primary) hypertension   [facility] Juline Patch, MD   1 year ago Essential (primary) hypertension   [facility] Juline Patch, MD   1 year ago Essential (primary) hypertension   [facility] Juline Patch, MD              diclofenac (VOLTAREN) 75 MG EC tablet [Pharmacy Med Name: Diclofenac Sodium 75 MG Oral Tablet Delayed Release] 70 tablet 0    Sig: Take 1 tablet by mouth twice daily     Analgesics:  NSAIDS Failed - 04/08/2021  6:30 PM      Failed - Manual Review: Labs are only required if the patient has taken medication for more than 8 weeks.      Failed - Cr in normal range and within 360 days    Creatinine  Date Value Ref Range Status  03/09/2014 0.71 0.60 - 1.30 mg/dL Final   Creatinine, Ser  Date Value Ref Range Status  05/17/2019 0.82 0.57 - 1.00 mg/dL Final         Failed - eGFR is 30 or above and within 360 days    EGFR (African American)  Date Value Ref Range Status  03/09/2014 >60 >68m/min Final  07/05/2013 >60  Final   GFR calc Af Amer  Date Value Ref Range Status  05/17/2019 93 >59 mL/min/1.73 Final   EGFR (Non-African Amer.)   Date Value Ref Range Status  03/09/2014 >60 >675mmin Final    Comment:    eGFR values <6032min/1.73 m2 may be an indication of chronic kidney disease (CKD). Calculated eGFR, using the MRDR Study equation, is useful in  patients with stable renal function. The eGFR calculation will not be reliable in acutely ill patients when serum creatinine is changing rapidly. It is not useful in patients on dialysis. The eGFR calculation may not be applicable to patients at the low and high extremes of body sizes, pregnant women, and vegetarians.   07/05/2013 >60  Final    Comment:    eGFR values <1m66mn/1.73 m2 may be an indication of chronic kidney disease (CKD). Calculated eGFR is useful in patients with stable renal function. The eGFR calculation will not be reliable in acutely ill patients when serum creatinine is changing rapidly. It is not useful in  patients on dialysis. The eGFR calculation may not be applicable to patients at the low and high extremes of body sizes, pregnant women, and vegetarians.    GFR calc non  Af Amer  Date Value Ref Range Status  05/17/2019 80 >59 mL/min/1.73 Final         Passed - HGB in normal range and within 360 days    Hemoglobin  Date Value Ref Range Status  05/22/2020 13.2 11.1 - 15.9 g/dL Final         Passed - PLT in normal range and within 360 days    Platelets  Date Value Ref Range Status  05/22/2020 265 150 - 450 x10E3/uL Final         Passed - HCT in normal range and within 360 days    Hematocrit  Date Value Ref Range Status  05/22/2020 40.6 34.0 - 46.6 % Final         Passed - Patient is not pregnant      Passed - Valid encounter within last 12 months    Recent Outpatient Visits          4 months ago Cervical spondylosis   [facility] Montel Culver, MD   5 months ago Essential (primary) hypertension   [facility] Juline Patch, MD   10 months ago Essential (primary) hypertension   [facility] Juline Patch, MD   1 year ago Essential (primary) hypertension   [facility] Juline Patch, MD   1 year ago Essential (primary) hypertension   [facility] Juline Patch, MD              ibuprofen (ADVIL) 800 MG tablet [Pharmacy Med Name: Ibuprofen 800 MG Oral Tablet] 90 tablet 0    Sig: TAKE 1 TABLET BY MOUTH EVERY 8 HOURS AS NEEDED     Analgesics:  NSAIDS Failed - 04/08/2021  6:30 PM      Failed - Manual Review: Labs are only required if the patient has taken medication for more than 8 weeks.      Failed - Cr in normal range and within 360 days    Creatinine  Date Value Ref Range Status  03/09/2014 0.71 0.60 - 1.30 mg/dL Final   Creatinine, Ser  Date Value Ref Range Status  05/17/2019 0.82 0.57 - 1.00 mg/dL Final         Failed - eGFR is 30 or above and within 360 days    EGFR (African American)  Date Value Ref Range Status  03/09/2014 >60 >30m/min Final  07/05/2013 >60  Final   GFR calc Af Amer  Date Value Ref Range Status  05/17/2019 93 >59 mL/min/1.73 Final   EGFR (Non-African Amer.)  Date Value Ref Range Status  03/09/2014 >60 >63mmin Final    Comment:    eGFR values <6022min/1.73 m2 may be an indication of chronic kidney disease (CKD). Calculated eGFR, using the MRDR Study equation, is useful in  patients with stable renal function. The eGFR calculation will not be reliable in acutely ill patients when serum creatinine is changing rapidly. It is not useful in patients on dialysis. The eGFR calculation may not be applicable to patients at the low and high extremes of body sizes, pregnant women, and vegetarians.   07/05/2013 >60  Final    Comment:    eGFR values <103m68mn/1.73 m2 may be an indication of chronic kidney disease (CKD). Calculated eGFR is useful in patients with stable renal function. The eGFR calculation will not be reliable in acutely ill patients when serum creatinine is changing rapidly. It is not  useful in  patients on dialysis. The  eGFR calculation may not be applicable to patients at the low and high extremes of body sizes, pregnant women, and vegetarians.    GFR calc non Af Amer  Date Value Ref Range Status  05/17/2019 80 >59 mL/min/1.73 Final         Passed - HGB in normal range and within 360 days    Hemoglobin  Date Value Ref Range Status  05/22/2020 13.2 11.1 - 15.9 g/dL Final         Passed - PLT in normal range and within 360 days    Platelets  Date Value Ref Range Status  05/22/2020 265 150 - 450 x10E3/uL Final         Passed - HCT in normal range and within 360 days    Hematocrit  Date Value Ref Range Status  05/22/2020 40.6 34.0 - 46.6 % Final         Passed - Patient is not pregnant      Passed - Valid encounter within last 12 months    Recent Outpatient Visits          4 months ago Cervical spondylosis   [facility] Montel Culver, MD   5 months ago Essential (primary) hypertension   [facility] Juline Patch, MD   10 months ago Essential (primary) hypertension   [facility] Juline Patch, MD   1 year ago Essential (primary) hypertension   [facility] Juline Patch, MD   1 year ago Essential (primary) hypertension   [facility] Juline Patch, MD

## 2021-04-10 ENCOUNTER — Other Ambulatory Visit: Payer: Self-pay

## 2021-04-10 ENCOUNTER — Encounter: Payer: Self-pay | Admitting: Family Medicine

## 2021-04-10 ENCOUNTER — Ambulatory Visit: Payer: BC Managed Care – PPO | Admitting: Family Medicine

## 2021-04-10 VITALS — BP 124/76 | HR 64 | Ht 71.0 in | Wt 203.0 lb

## 2021-04-10 DIAGNOSIS — B354 Tinea corporis: Secondary | ICD-10-CM | POA: Diagnosis not present

## 2021-04-10 DIAGNOSIS — M47812 Spondylosis without myelopathy or radiculopathy, cervical region: Secondary | ICD-10-CM

## 2021-04-10 DIAGNOSIS — M9901 Segmental and somatic dysfunction of cervical region: Secondary | ICD-10-CM

## 2021-04-10 MED ORDER — CLOTRIMAZOLE-BETAMETHASONE 1-0.05 % EX CREA: 1.0000 "application " | TOPICAL_CREAM | Freq: Two times a day (BID) | CUTANEOUS | 0 refills | Status: DC

## 2021-04-10 MED ORDER — DICLOFENAC SODIUM 75 MG PO TBEC: 75.0000 mg | DELAYED_RELEASE_TABLET | Freq: Two times a day (BID) | ORAL | 0 refills | Status: DC

## 2021-04-10 NOTE — Assessment & Plan Note (Signed)
Rash at the base of her scalp noted over the past few weeks. Inspection reveals mildly raised, erythematous ring most consistent with tinea corporis. Plan for BID Lotrisone x 1 week. If recurrent symptomatology, defer to PCP Dr. Yetta Barre.

## 2021-04-10 NOTE — Progress Notes (Signed)
°  ° °  Primary Care / Sports Medicine Office Visit  Patient Information:  Patient ID: Sherri Matthews, female DOB: 02-11-1966 Age: 56 y.o. MRN: [id number]   Sherri Matthews is a pleasant 56 y.o. female presenting with the following:  Chief Complaint  Patient presents with   Cervical spondylosis    Last seen 11/20/20; last imaging 04/14/2018; has not completed PT due to mother passing away in October; 3/10 pain   Rash    Located on scalp anterior above the neck     Vitals:   04/10/21 1551  BP: 124/76  Pulse: 64  SpO2: 98%   Vitals:   04/10/21 1551  Weight: 203 lb (92.1 kg)  Height: 5\' 11"  (1.803 m)   Body mass index is 28.31 kg/m.  No results found.   Independent interpretation of notes and tests performed by another provider:   None  Procedures performed:   None  Pertinent History, Exam, Impression, and Recommendations:   Tinea corporis Rash at the base of her scalp noted over the past few weeks. Inspection reveals mildly raised, erythematous ring most consistent with tinea corporis. Plan for BID Lotrisone x 1 week. If recurrent symptomatology, defer to PCP Dr. .  Cervical spondylosis Chronic condition, presents for follow-up to 11/20/2020 visit at that visit she was noted to have multilevel cervical spondylosis with deconditioning throughout the paraspinal musculature consistent with upper crossed syndrome.  She was advised scheduled diclofenac, as needed cyclobenzaprine, and formal physical therapy.  Unfortunately, due to loss in the family, she was unable to attend physical therapy.  Additionally, she states that she has been dosing diclofenac nightly on a as needed basis and never does this in a scheduled manner.  She did note mild improvement with her stated regimen.  Findings today reveal significant paraspinal muscular spasm with focality to the upper trapezius and levator scapulae regions, secondarily to the rhomboids.  Negative Spurling's  bilaterally.  Plan for Rx management after discussion on what patient is amenable to, she is open to dosing of diclofenac 75 mg twice daily scheduled as an adjunct to formal physical therapy.  A new referral was placed today.  We will have the patient return in 6 weeks for reevaluation.  If suboptimal progress, anticipate advanced imaging as she is not amenable to duloxetine.   Orders & Medications Meds ordered this encounter  Medications   clotrimazole-betamethasone (LOTRISONE) cream    Sig: Apply 1 application topically 2 (two) times daily. X 1 week    Dispense:  45 g    Refill:  0   diclofenac (VOLTAREN) 75 MG EC tablet    Sig: Take 1 tablet (75 mg total) by mouth 2 (two) times daily.    Dispense:  90 tablet    Refill:  0   Orders Placed This Encounter  Procedures   Ambulatory referral to Physical Therapy     Return in about 6 weeks (around 05/22/2021).     05/24/2021, MD   Primary Care Sports Medicine [facility]

## 2021-04-10 NOTE — Assessment & Plan Note (Signed)
Chronic condition, presents for follow-up to 11/20/2020 visit at that visit she was noted to have multilevel cervical spondylosis with deconditioning throughout the paraspinal musculature consistent with upper crossed syndrome.  She was advised scheduled diclofenac, as needed cyclobenzaprine, and formal physical therapy.  Unfortunately, due to loss in the family, she was unable to attend physical therapy.  Additionally, she states that she has been dosing diclofenac nightly on a as needed basis and never does this in a scheduled manner.  She did note mild improvement with her stated regimen.  Findings today reveal significant paraspinal muscular spasm with focality to the upper trapezius and levator scapulae regions, secondarily to the rhomboids.  Negative Spurling's bilaterally.  Plan for Rx management after discussion on what patient is amenable to, she is open to dosing of diclofenac 75 mg twice daily scheduled as an adjunct to formal physical therapy.  A new referral was placed today.  We will have the patient return in 6 weeks for reevaluation.  If suboptimal progress, anticipate advanced imaging as she is not amenable to duloxetine.

## 2021-04-20 ENCOUNTER — Other Ambulatory Visit: Payer: Self-pay

## 2021-04-20 ENCOUNTER — Encounter: Payer: Self-pay | Admitting: Physical Therapy

## 2021-04-20 ENCOUNTER — Ambulatory Visit: Payer: BC Managed Care – PPO | Attending: Family Medicine

## 2021-04-20 DIAGNOSIS — M9901 Segmental and somatic dysfunction of cervical region: Secondary | ICD-10-CM | POA: Diagnosis not present

## 2021-04-20 DIAGNOSIS — M542 Cervicalgia: Secondary | ICD-10-CM | POA: Insufficient documentation

## 2021-04-20 DIAGNOSIS — R293 Abnormal posture: Secondary | ICD-10-CM | POA: Diagnosis present

## 2021-04-20 DIAGNOSIS — M47812 Spondylosis without myelopathy or radiculopathy, cervical region: Secondary | ICD-10-CM | POA: Insufficient documentation

## 2021-04-20 NOTE — Therapy (Signed)
[facility] [address] [place], [place], [address] Phone: [phone]   Fax:  [phone]  Physical Therapy Evaluation  Patient Details  Name: Sherri Matthews MRN: [id number] Date of Birth: January 05, 1966 Referring Provider (PT): Joseph Berkshirematthews, Jason MD   Encounter Date: 04/20/2021   PT End of Session - 04/21/21 0833     Visit Number 1    Number of Visits 17    Date for PT Re-Evaluation 06/16/21    PT Start Time 1716    PT Stop Time 1800    PT Time Calculation (min) 44 min    Activity Tolerance Patient tolerated treatment well    Behavior During Therapy [facility] for tasks assessed/performed             Past Medical History:  Diagnosis Date   Anxiety    AV node dysfunction    tachycardia sees Dr. Lady GaryFath   Cervical disc disease    degenerative disk    Chronic abdominal pain    Heart murmur    Hyperlipidemia    Hypertension    IBS (irritable bowel syndrome)    Mitral valve prolapse    Palpitations    Sleep apnea    possible but not tested    Past Surgical History:  Procedure Laterality Date   APPENDECTOMY  2008   CARDIAC CATHETERIZATION N/A 09/12/2015   Procedure: Left Heart Cath and Coronary Angiography;  Surgeon: Marcina MillardAlexander Paraschos, MD;  Location: [facility] INVASIVE CV LAB;  Service: Cardiovascular;  Laterality: N/A;   ESOPHAGOGASTRODUODENOSCOPY (EGD) WITH PROPOFOL N/A 04/18/2015   Procedure: ESOPHAGOGASTRODUODENOSCOPY (EGD) WITH PROPOFOL;  Surgeon: Midge Miniumarren Wohl, MD;  [facility];  Service: Endoscopy;  Laterality: N/A;   HERNIA REPAIR      There were no vitals filed for this visit.    Subjective Assessment - 04/20/21 1719     Subjective Pt is a 56 y.o. female referred to PT for cervical pain. PMH includes: Anxiety, HLD, HTN, IBS, mitral valve prolapse, AV node dysfunction, Cervical DDD.    Pertinent History Pt is a 56 y.o. female referred to PT for cervical pain. PMH includes: Anxiety, HLD, HTN, IBS,  mitral valve prolapse, AV node dysfunction, Cervical DDD. Pt reports cervical pain has occurred for ~ 3 years now. Cervical pain has mild relief with medications, deep pressure with massage. Reports neck pain at B sides of neck and midline pain described as tightness, does endorse subtle RUE weakness with overhead ADL's and bathing. Pain also in shoulder blades described as burning ache. Cervical pain described as aching pain, does have sharp pain with cervical mobility such as extension. Pt denies N/t and sensation changes. Pt denies any pain relief besides heating pad b/t legsand medications. Endorsing inner groin cramping primarily at night when laying on her back but discloses history of LBP and low back issues. Pt denies saddle anesthesia, denies B/B changes, unrelenting night pain. Pain worsened by laying down at night, cervical extension, soreness in all other planes. Pt describes pain as a 8/10 NPS at worst, best 2/10 NPS, currently 3.5/10 NPS. 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 Pt's goal is to improve cervical neck pain and sleep without being woken up by pain.    Limitations House hold activities    Patient Stated Goals Improve pain, sleep better without being woken up with pain.    Currently in Pain? Yes    Pain Score 3     Pain Location Neck  Pain Orientation Right;Left    Pain Descriptors / Indicators Dull;Burning    Pain Type Chronic pain    Pain Onset More than a month ago    Pain Frequency Intermittent    Aggravating Factors  Cervical extension, laying down in supine    Pain Relieving Factors Mild relief with medication             OBJECTIVE  Mental Status Patient's fund of knowledge is within normal limits for educational level.    MUSCULOSKELETAL: Tremor: None Bulk: Normal Tone: Normal  Posture Mild forward head posture and rounded shoulders  Palpation TTP along cervical paraspinals R/L, middle traps and rhomboids bilat.    Strength R/L 5/5 Shoulder flexion (anterior deltoid/pec major/coracobrachialis, axillary n. (C5/6) and musculocutaneous n. (C5-7)) 5/5 Shoulder abduction (deltoid/supraspinatus, axillary/suprascapular n, C5) 5/5 Shoulder external rotation (infraspinatus/teres minor) 5/5 Shoulder internal rotation (subcapularis/lats/pec major) 5/5 Elbow flexion (biceps brachii, brachialis, brachioradialis, musculoskeletal n, C5/6) 5/5 Elbow extension (triceps, radial n, C7) 5/5 Wrist Extension (C6/7) 5/5 Wrist Flexion (C6/7) 5/5 Finger adduction (interossei, ulnar n, T1)   AROM R/L 49 Cervical Flexion 30* Cervical Extension 35*/35* Cervical Lateral Flexion 64/55 Cervical Rotation (pt reporting crepitus) *Indicates pain, overpressure performed unless otherwise indicated  PROM R/L PROM > AROM in all planes  *Indicates pain, overpressure performed unless otherwise indicated  Repeated Movements Not indicated based off of subjective findings, cervical rotation AROM, negative Spurling's, and normal strength and sensation bilat.   Muscle Length Upper Trap: Limited bilat. See cervical AROM for measurement.    Passive Accessory Intervertebral Motion (PAIVM) Pt denies reproduction of neck pain with CPA C2-T7 and UPA bilaterally C2-T7. Generally hypomobile throughout.     SPECIAL TESTS Spurlings A (ipsilateral lateral flexion/axial compression): R: Negative L: Negative Spurlings B (ipsilateral lateral flexion/contralateral rotation/axial compression): R: Negative L: Negative Distraction Test: Negative. Reports improvement in cervical pain but pt does not have radicular symptoms.   Hoffman Sign (cervical cord compression): R: Not done L: Not done ULTT Median: R: Not done L: Not done ULTT Ulnar: R: Not done L: Not done ULTT Radial: R: Not done L: Not done  Clinical Prediction Rules  Diagnostic Cervical Radiculopathy ULTTa: Any one of the following: A) symptom reproduction; B) side-to-side  difference >10 degrees in elbow extension; or C) with regard to involved/painful side: ipsilateral neck lateral flexion decreases symptoms and/or contralateral neck lateral flexion increases symptoms.  ROM: involved-side cervical rotation range of motion less than 60 degrees Distraction test: symptom reduction.  Spurling's A: symptom reproduction.   Number of Positive Criteria  Sensitivity  Specificity  Pos LR  Neg LR   Two  0.39 0.56 0.88 1.09   Three  0.39  0.94  6.1 0.65   Four  0.24 0.99  30.3 0.77   Pos LR = positive likelihood ratio. Neg LR = negative likelihood ratio.    Education:  Pt provided education on sleeping posture and HEP at end of session.   [facility] PT Assessment - 04/21/21 0001       Assessment   Medical Diagnosis Cervicalgia    Referring Provider (PT) Joseph Berkshire MD    Onset Date/Surgical Date --   ~3 years ago   Prior Therapy No      Precautions   Precautions None      Prior Function   Level of Independence Independent    Vocation Full time employment    Vocation Requirements Standing tasks, being on feet all day      Cognition  Overall Cognitive Status Within Functional Limits for tasks assessed      Sensation   Light Touch Appears Intact for cervical and lumbar dermatomes                       Objective measurements completed on examination: See above findings.    PT Education - 04/21/21 9983     Education Details POC, HEP. Sleeping posture    Person(s) Educated Patient    Methods Explanation;Demonstration;Tactile cues;Verbal cues    Comprehension Verbalized understanding;Returned demonstration              PT Short Term Goals - 04/21/21 0841       PT SHORT TERM GOAL #1   Title Pt will be indep with HEP to improve pain and mobility with ADL's, sleep, and work related tasks    Baseline 04/21/21: Initiated    Time 4    Period Weeks    Status New    Target Date 05/18/21               PT Long Term Goals -  04/21/21 0842       PT LONG TERM GOAL #1   Title Pt will improve FOTO to target score of 58 to demonstrate clinically significant improvement in perceived functional mobility.    Baseline 04/20/21: 53    Time 8    Period Weeks    Status New    Target Date 06/15/21      PT LONG TERM GOAL #2   Title Pt will improve cervical AROM by at least 10 deg in all planes (except flexion as near normal limits) to demonstrate clinically significant improvement in cervical mobility for needed head turning ADL's.    Baseline 04/20/21: Flexion WNL, extension: 30 deg, R/L rotation: 64/55 , R/L lat flexion: 35/35    Time 8    Period Weeks    Status New    Target Date 06/15/21      PT LONG TERM GOAL #3   Title Pt will improve DNF endurance to adult matched norms (females: age matched norms of 29.4 sec) to demonstrate improved posture with seated and standing ADL's and reduced need for cervical extensor overactivation for cervical stability.    Baseline 04/20/21: 16.46 sec    Time 8    Period Weeks    Status New    Target Date 06/15/21      PT LONG TERM GOAL #4   Title Pt will report a full night's sleep without being woken up from cervical pain to improve sleep quality.    Baseline 04/20/21: wakes up with cervical pain nightly.    Time 8    Period Weeks    Status New    Target Date 06/15/21                    Plan - 04/21/21 0834     Clinical Impression Statement Pt is a pleasant 56 y.o. female referred to OPPT for chronic cervical pain. Pt presents with deficits in cevical AROM limitations in all planes with extension being most irritable, poor deep neck flexor endurance compared to age matched norms, cervical joint hypomobility, and forward head posture and rounded shoulders with weakness in periscapulars tested via MMT. Pt does also display symptoms of positional vertigo with maneuvers on mat table throughout evaluation which may warrant future vestibular screening. These deficits are  leading to pain with overhead ADL completion, sleeping, and work related  tasks. Pt will benefit from skilled PT services to address deficits to improve pt's symptoms with ADL's.    Personal Factors and Comorbidities Age;Comorbidity 3+;Time since onset of injury/illness/exacerbation    Comorbidities Anxiety, HLD, HTN, IBS, mitral valve prolapse, AV node dysfunction, Cervical DDD.    Examination-Activity Limitations Stand;Sleep;Hygiene/Grooming    Examination-Participation Restrictions Community Activity;Occupation;Driving    Stability/Clinical Decision Making Evolving/Moderate complexity    Clinical Decision Making Moderate    Rehab Potential Good    PT Frequency 2x / week    PT Duration 8 weeks    PT Treatment/Interventions ADLs/Self Care Home Management;Canalith Repostioning;Cryotherapy;Electrical Stimulation;Moist Heat;Traction;Therapeutic activities;Patient/family education;Therapeutic exercise;Balance training;Neuromuscular re-education;Manual techniques;Spinal Manipulations;Joint Manipulations;Dry needling;Passive range of motion    PT Next Visit Plan Please reassess HEP, give printed hand out. Vestibular screening if positional vertigo persists, manual techniques for hypomobilty, DNF endurance.    PT Home Exercise Plan Sitting: cervical retractions, scap retractions, Bilat UT stretch    Consulted and Agree with Plan of Care Patient             Patient will benefit from skilled therapeutic intervention in order to improve the following deficits and impairments:  Improper body mechanics, Postural dysfunction, Decreased range of motion, Decreased strength, Hypomobility, Pain  Visit Diagnosis: Cervicalgia  Abnormal posture     Problem List Patient Active Problem List   Diagnosis Date Noted   Tinea corporis 04/10/2021   Cervical spondylosis 11/20/2020   Segmental and somatic dysfunction of cervical region 11/20/2020   Obstructive sleep apnea 12/25/2019   Intractable migraine  with aura without status migrainosus 06/21/2016   Chest pain 06/19/2016   Abnormal EKG 06/19/2016   Menopause 01/28/2016   Heart palpitations 10/09/2015   SOB (shortness of breath) 09/24/2015   Diverticulitis 06/04/2015   Lower back pain 06/04/2015   SVT (supraventricular tachycardia) (HCC) 06/04/2015   Essential hypertension 05/15/2015   Hyperlipidemia 05/15/2015   Gastritis    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 Fairly IV, PT, DPT Physical Therapist- [facility]  04/21/2021, 9:05 AM  [place] [facility] [address] [place], [place], [address] Phone: [phone]   Fax:  [phone]  Name: Annajane Niebrugge MRN: [id number] Date of Birth: [birth year]

## 2021-04-22 ENCOUNTER — Other Ambulatory Visit: Payer: Self-pay

## 2021-04-22 ENCOUNTER — Ambulatory Visit: Payer: BC Managed Care – PPO | Attending: Family Medicine | Admitting: Physical Therapy

## 2021-04-22 DIAGNOSIS — M542 Cervicalgia: Secondary | ICD-10-CM | POA: Diagnosis not present

## 2021-04-22 DIAGNOSIS — R293 Abnormal posture: Secondary | ICD-10-CM | POA: Insufficient documentation

## 2021-04-22 NOTE — Therapy (Signed)
[facility] [address] [place], [place], [address] Phone: [phone]   Fax:  [phone]  Physical Therapy Treatment  Patient Details  Name: Sherri Matthews MRN: [id number] Date of Birth: Aug 26, 1965 Referring Provider (PT): Joseph Berkshirematthews, Jason MD   Encounter Date: 04/22/2021   PT End of Session - 04/24/21 1532     Visit Number 2    Number of Visits 17    Date for PT Re-Evaluation 06/16/21    PT Start Time 1721    PT Stop Time 1802    PT Time Calculation (min) 41 min    Activity Tolerance Patient tolerated treatment well    Behavior During Therapy [facility] for tasks assessed/performed             Past Medical History:  Diagnosis Date   Anxiety    AV node dysfunction    tachycardia sees Dr. Lady GaryFath   Cervical disc disease    degenerative disk    Chronic abdominal pain    Heart murmur    Hyperlipidemia    Hypertension    IBS (irritable bowel syndrome)    Mitral valve prolapse    Palpitations    Sleep apnea    possible but not tested    Past Surgical History:  Procedure Laterality Date   APPENDECTOMY  2008   CARDIAC CATHETERIZATION N/A 09/12/2015   Procedure: Left Heart Cath and Coronary Angiography;  Surgeon: Marcina MillardAlexander Paraschos, MD;  Location: [facility] INVASIVE CV LAB;  Service: Cardiovascular;  Laterality: N/A;   ESOPHAGOGASTRODUODENOSCOPY (EGD) WITH PROPOFOL N/A 04/18/2015   Procedure: ESOPHAGOGASTRODUODENOSCOPY (EGD) WITH PROPOFOL;  Surgeon: Midge Miniumarren Wohl, MD;  [facility];  Service: Endoscopy;  Laterality: N/A;   HERNIA REPAIR      There were no vitals filed for this visit.   Subjective Assessment - 04/24/21 1532     Subjective Patient reports compliance with HEP. Patient reports several years of neck pain. Patient reports most difficulty with looking up/tilting head backward. Patient reports that she did well following MT last visit. Pt repots discomfort between shoulder blades that feels better with  leaning forward in her desk chair. Pt reports 2.5/10 pain at arrival to PT today. Pt reports midline pain along cervical spine, no HA.    Pertinent History Pt is a 56 y.o. female referred to PT for cervical pain. PMH includes: Anxiety, HLD, HTN, IBS, mitral valve prolapse, AV node dysfunction, Cervical DDD. Pt reports cervical pain has occurred for ~ 3 years now. Cervical pain has mild relief with medications, deep pressure with massage. Reports neck pain at B sides of neck and midline pain described as tightness, does endorse subtle RUE weakness with overhead ADL's and bathing. Pain also in shoulder blades described as burning ache. Cervical pain described as aching pain, does have sharp pain with cervical mobility such as extension. Pt denies N/t and sensation changes. Pt denies any pain relief besides heating pad b/t legsand medications. Endorsing inner groin cramping primarily at night when laying on her back but discloses history of LBP and low back issues. Pt denies saddle anesthesia, denies B/B changes, unrelenting night pain. Pain worsened by laying down at night, cervical extension, soreness in all other planes. Pt describes pain as a 8/10 NPS at worst, best 2/10 NPS, currently 3.5/10 NPS. 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 Pt's goal is to improve cervical neck pain and sleep without being woken up by pain.    Limitations House  hold activities    Patient Stated Goals Improve pain, sleep better without being woken up with pain.    Pain Onset More than a month ago                TREATMENT   Manual Therapy - for symptom modulation, soft tissue sensitivity and mobility, joint mobility, ROM   Manual cervical traction, intermittent; 10 sec on and 10 sec off PA mobilization, gr I-II with therapist's 1st MTPs in supine STM/DTM bilateral cervical paraspinals, bilateral Uts TPR R and L UT Manual upper trapezius stretching, 2x30 sec on either side   Therapeutic Exercise -  for improved soft tissue flexibility and extensibility as needed for ROM, cervical spine mobility   Cervical SNAG for extension following demo and tactile/verbal cueing for technique; 1x10  Pt edu: Reviewed and provided HEP handout with established home exercises and SNAG for cervical extension today    ASSESSMENT Patient arrives with excellent motivation to participate in physical therapy. Pt has responded well with initial intervention with improved tolerance of sleeping positions following previous visit and good response to manual intervention and SNAG technique for cervical spine extension today. She does not have notable paresthesias or upper limb referred symptoms, but she does experience intermittent periscapular symptoms. Mid-line posterior cervical spine pain is mitigated with use of traction. Pt has intermittent dizziness with changes in position on mat and may benefit from vestibular screen/referral for vestibular treatment. Patient has remaining deficits in deficits in cevical spine  extension, deep neck flexor endurance compared to age matched norms, C-spine facet joint accessory mobility, and forward head posture and rounded shoulders with weakness in periscapular mm. Patient will benefit from continued skilled therapeutic intervention to address the above deficits as needed for improved function and QoL.    PT Short Term Goals - 04/21/21 0841       PT SHORT TERM GOAL #1   Title Pt will be indep with HEP to improve pain and mobility with ADL's, sleep, and work related tasks    Baseline 04/21/21: Initiated    Time 4    Period Weeks    Status New    Target Date 05/18/21               PT Long Term Goals - 04/21/21 0842       PT LONG TERM GOAL #1   Title Pt will improve FOTO to target score of 58 to demonstrate clinically significant improvement in perceived functional mobility.    Baseline 04/20/21: 53    Time 8    Period Weeks    Status New    Target Date 06/15/21       PT LONG TERM GOAL #2   Title Pt will improve cervical AROM by at least 10 deg in all planes (except flexion as near normal limits) to demonstrate clinically significant improvement in cervical mobility for needed head turning ADL's.    Baseline 04/20/21: Flexion WNL, extension: 30 deg, R/L rotation: 64/55 , R/L lat flexion: 35/35    Time 8    Period Weeks    Status New    Target Date 06/15/21      PT LONG TERM GOAL #3   Title Pt will improve DNF endurance to adult matched norms (females: age matched norms of 29.4 sec) to demonstrate improved posture with seated and standing ADL's and reduced need for cervical extensor overactivation for cervical stability.    Baseline 04/20/21: 16.46 sec    Time 8  Period Weeks    Status New    Target Date 06/15/21      PT LONG TERM GOAL #4   Title Pt will report a full night's sleep without being woken up from cervical pain to improve sleep quality.    Baseline 04/20/21: wakes up with cervical pain nightly.    Time 8    Period Weeks    Status New    Target Date 06/15/21                   Plan - 04/24/21 2001     Clinical Impression Statement Patient arrives with excellent motivation to participate in physical therapy. Pt has responded well with initial intervention with improved tolerance of sleeping positions following previous visit and good response to manual intervention and SNAG technique for cervical spine extension today. She does not have notable paresthesias or upper limb referred symptoms, but she does experience intermittent periscapular symptoms. Mid-line posterior cervical spine pain is mitigated with use of traction. Pt has intermittent dizziness with changes in position on mat and may benefit from vestibular screen/referral for vestibular treatment. Patient has remaining deficits in deficits in cevical spine  extension, deep neck flexor endurance compared to age matched norms, C-spine facet joint accessory mobility, and  forward head posture and rounded shoulders with weakness in periscapular mm. Patient will benefit from continued skilled therapeutic intervention to address the above deficits as needed for improved function and QoL.    Personal Factors and Comorbidities Age;Comorbidity 3+;Time since onset of injury/illness/exacerbation    Comorbidities Anxiety, HLD, HTN, IBS, mitral valve prolapse, AV node dysfunction, Cervical DDD.    Examination-Activity Limitations Stand;Sleep;Hygiene/Grooming    Examination-Participation Restrictions Community Activity;Occupation;Driving    Stability/Clinical Decision Making Evolving/Moderate complexity    Rehab Potential Good    PT Frequency 2x / week    PT Duration 8 weeks    PT Treatment/Interventions ADLs/Self Care Home Management;Canalith Repostioning;Cryotherapy;Electrical Stimulation;Moist Heat;Traction;Therapeutic activities;Patient/family education;Therapeutic exercise;Balance training;Neuromuscular re-education;Manual techniques;Spinal Manipulations;Joint Manipulations;Dry needling;Passive range of motion    PT Next Visit Plan Manual techniques for hypomobilty, DNF endurance; cervical spine ROM. Will arrange for vestibular screen given ongoing vertigo with positional changes in lying    PT Home Exercise Plan Sitting: cervical retractions, scap retractions, Bilat UT stretch    Consulted and Agree with Plan of Care Patient             Patient will benefit from skilled therapeutic intervention in order to improve the following deficits and impairments:  Improper body mechanics, Postural dysfunction, Decreased range of motion, Decreased strength, Hypomobility, Pain  Visit Diagnosis: Cervicalgia  Abnormal posture     Problem List Patient Active Problem List   Diagnosis Date Noted   Tinea corporis 04/10/2021   Cervical spondylosis 11/20/2020   Segmental and somatic dysfunction of cervical region 11/20/2020   Obstructive sleep apnea 12/25/2019    Intractable migraine with aura without status migrainosus 06/21/2016   Chest pain 06/19/2016   Abnormal EKG 06/19/2016   Menopause 01/28/2016   Heart palpitations 10/09/2015   SOB (shortness of breath) 09/24/2015   Diverticulitis 06/04/2015   Lower back pain 06/04/2015   SVT (supraventricular tachycardia) (HCC) 06/04/2015   Essential hypertension 05/15/2015   Hyperlipidemia 05/15/2015   Gastritis    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 [place], [place], [address] Phone: [phone]   Fax:  [phone]  Name: Sherri Matthews MRN: [id number] Date of Birth: May 06, 1965

## 2021-04-24 ENCOUNTER — Encounter: Payer: Self-pay | Admitting: Physical Therapy

## 2021-04-28 ENCOUNTER — Ambulatory Visit: Payer: BC Managed Care – PPO | Admitting: Family Medicine

## 2021-04-28 ENCOUNTER — Encounter: Payer: Self-pay | Admitting: Family Medicine

## 2021-04-28 ENCOUNTER — Other Ambulatory Visit: Payer: Self-pay

## 2021-04-28 VITALS — BP 120/80 | HR 80 | Ht 71.0 in | Wt 201.0 lb

## 2021-04-28 DIAGNOSIS — I10 Essential (primary) hypertension: Secondary | ICD-10-CM | POA: Diagnosis not present

## 2021-04-28 DIAGNOSIS — K219 Gastro-esophageal reflux disease without esophagitis: Secondary | ICD-10-CM

## 2021-04-28 DIAGNOSIS — E7849 Other hyperlipidemia: Secondary | ICD-10-CM

## 2021-04-28 MED ORDER — OMEPRAZOLE 40 MG PO CPDR: DELAYED_RELEASE_CAPSULE | ORAL | 1 refills | Status: DC

## 2021-04-28 MED ORDER — METOPROLOL TARTRATE 25 MG PO TABS: ORAL_TABLET | ORAL | 1 refills | Status: DC

## 2021-04-28 MED ORDER — SIMVASTATIN 40 MG PO TABS: 40.0000 mg | ORAL_TABLET | Freq: Every day | ORAL | 1 refills | Status: DC

## 2021-04-28 MED ORDER — AMLODIPINE BESYLATE 10 MG PO TABS: 5.0000 mg | ORAL_TABLET | Freq: Every day | ORAL | 1 refills | Status: DC

## 2021-04-28 NOTE — Progress Notes (Signed)
Date:  04/28/2021   Name:  Sherri Matthews   DOB:  Sep 22, 1965   MRN:  [id number]   Chief Complaint: Hypertension, Hyperlipidemia, and Gastroesophageal Reflux  Hypertension This is a chronic problem. The current episode started more than 1 year ago. The problem has been gradually improving since onset. The problem is controlled. Pertinent negatives include no anxiety, blurred vision, chest pain, headaches, malaise/fatigue, neck pain, orthopnea, palpitations, peripheral edema, PND, shortness of breath or sweats. There are no associated agents to hypertension. Risk factors for coronary artery disease include dyslipidemia. Past treatments include calcium channel blockers and beta blockers. The current treatment provides moderate improvement. There are no compliance problems.  There is no history of angina, kidney disease, CAD/MI, CVA, heart failure, left ventricular hypertrophy, PVD or retinopathy. There is no history of chronic renal disease, a hypertension causing med or renovascular disease.  Hyperlipidemia This is a chronic problem. The current episode started more than 1 year ago. The problem is controlled. Recent lipid tests were reviewed and are normal. She has no history of chronic renal disease, diabetes, hypothyroidism, liver disease, obesity or nephrotic syndrome. There are no known factors aggravating her hyperlipidemia. Pertinent negatives include no chest pain, myalgias or shortness of breath. Current antihyperlipidemic treatment includes statins. The current treatment provides moderate improvement of lipids. There are no compliance problems.  There are no known risk factors for coronary artery disease.  Gastroesophageal Reflux She reports no abdominal pain, no belching, no chest pain, no coughing, no heartburn, no nausea, no sore throat or no wheezing. The problem has been gradually improving. She has tried a PPI for the symptoms.   Lab Results  Component Value Date   NA 142  05/17/2019   K 4.3 05/17/2019   CO2 25 05/17/2019   GLUCOSE 100 (H) 05/17/2019   BUN 16 05/17/2019   CREATININE 0.82 05/17/2019   CALCIUM 9.5 05/17/2019   GFRNONAA 80 05/17/2019   Lab Results  Component Value Date   CHOL 195 05/17/2019   HDL 44 05/17/2019   LDLCALC 121 (H) 05/17/2019   TRIG 170 (H) 05/17/2019   CHOLHDL 4.6 (H) 02/24/2018   Lab Results  Component Value Date   TSH 0.910 11/01/2017   No results found for: HGBA1C Lab Results  Component Value Date   WBC 5.2 05/22/2020   HGB 13.2 05/22/2020   HCT 40.6 05/22/2020   MCV 84 05/22/2020   PLT 265 05/22/2020   Lab Results  Component Value Date   ALT 30 05/17/2019   AST 30 05/17/2019   ALKPHOS 112 05/17/2019   BILITOT 0.8 05/17/2019   No results found for: 25OHVITD2, 25OHVITD3, VD25OH   Review of Systems  Constitutional:  Negative for chills, fever and malaise/fatigue.  HENT:  Negative for drooling, ear discharge, ear pain and sore throat.   Eyes:  Negative for blurred vision.  Respiratory:  Negative for cough, shortness of breath and wheezing.   Cardiovascular:  Negative for chest pain, palpitations, orthopnea, leg swelling and PND.  Gastrointestinal:  Negative for abdominal pain, blood in stool, constipation, diarrhea, heartburn and nausea.  Endocrine: Negative for polydipsia.  Genitourinary:  Negative for dysuria, frequency, hematuria and urgency.  Musculoskeletal:  Negative for back pain, myalgias and neck pain.  Skin:  Negative for rash.  Allergic/Immunologic: Negative for environmental allergies.  Neurological:  Negative for dizziness and headaches.  Hematological:  Does not bruise/bleed easily.  Psychiatric/Behavioral:  Negative for suicidal ideas. The patient is not nervous/anxious.    Patient  Active Problem List   Diagnosis Date Noted   Tinea corporis 04/10/2021   Cervical spondylosis 11/20/2020   Segmental and somatic dysfunction of cervical region 11/20/2020   Obstructive sleep apnea  12/25/2019   Intractable migraine with aura without status migrainosus 06/21/2016   Chest pain 06/19/2016   Abnormal EKG 06/19/2016   Menopause 01/28/2016   Heart palpitations 10/09/2015   SOB (shortness of breath) 09/24/2015   Diverticulitis 06/04/2015   Lower back pain 06/04/2015   SVT (supraventricular tachycardia) (HCC) 06/04/2015   Essential hypertension 05/15/2015   Hyperlipidemia 05/15/2015   Gastritis    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     Vicodin [Hydrocodone-Acetaminophen] Palpitations   Dilaudid [Hydromorphone Hcl] Other (See Comments)   Fenofibrate Other (See Comments)    Myalgia    Past Surgical History:  Procedure Laterality Date   APPENDECTOMY  2008   CARDIAC CATHETERIZATION N/A 09/12/2015   Procedure: Left Heart Cath and Coronary Angiography;  Surgeon: Marcina MillardAlexander Paraschos, MD;  Location: [facility];  Service: Cardiovascular;  Laterality: N/A;   ESOPHAGOGASTRODUODENOSCOPY (EGD) WITH PROPOFOL N/A 04/18/2015   Procedure: ESOPHAGOGASTRODUODENOSCOPY (EGD) WITH PROPOFOL;  Surgeon: Midge Miniumarren Wohl, MD;  [facility];  Service: Endoscopy;  Laterality: N/A;   HERNIA REPAIR      Social History   Tobacco Use   Smoking status: Never   Smokeless tobacco: Never  Vaping Use   Vaping Use: Never used  Substance Use Topics   Alcohol use: Not Currently   Drug use: Never     Medication list has been reviewed and updated.  Current Meds  Medication Sig   ALPRAZolam (XANAX) 0.5 MG  tablet TAKE ONE TABLET BY MOUTH ONCE DAILY AS NEEDED   amLODipine (NORVASC) 10 MG tablet Take 0.5 tablets (5 mg total) by mouth daily.   B Complex-C (SUPER B COMPLEX PO) Take 1 tablet by mouth daily.   BIOTIN PO Take 1 capsule by mouth daily.   clotrimazole-betamethasone (LOTRISONE) cream Apply 1 application topically 2 (two) times daily. X 1 week   diclofenac (VOLTAREN) 75 MG EC tablet Take 1 tablet (75 mg total) by mouth 2 (two) times daily.   Magnesium 250 MG TABS Take 1 tablet by mouth daily.   metoprolol tartrate (LOPRESSOR) 25 MG tablet Take 1/2 (one-half) tablet by mouth twice daily   omeprazole (PRILOSEC) 40 MG capsule TAKE 1 CAPSULE BY MOUTH TWICE DAILY. NEED APPOINTMENT FOR FURTHER REFILLS.   simvastatin (ZOCOR) 40 MG tablet Take 1 tablet (40 mg total) by mouth daily.    PHQ 2/9 Scores 04/28/2021 04/10/2021 11/20/2020 05/22/2020  PHQ - 2 Score 0 0 0 0  PHQ- 9 Score 0 4 1 2     GAD 7 : Generalized Anxiety Score 04/28/2021 04/10/2021 11/20/2020 05/22/2020  Nervous, Anxious, on Edge 0 0 1 1  Control/stop worrying 0 0 0 0  Worry too much - different things 0 0 1 0  Trouble relaxing 0 0 1 0  Restless 0 0 0 0  Easily annoyed or irritable 0 0 0 2  Afraid - awful might happen  0 0 1 0  Total GAD 7 Score 0 0 4 3  Anxiety Difficulty Not difficult at all - Not difficult at all Not difficult at all    BP Readings from Last 3 Encounters:  04/28/21 120/80  04/10/21 124/76  11/20/20 102/64    Physical Exam Vitals and nursing note reviewed. Exam conducted with a chaperone present.  Constitutional:      General: She is not in acute distress.    Appearance: She is not diaphoretic.  HENT:     Head: Normocephalic and atraumatic.     Right Ear: Tympanic membrane and external ear normal.     Left Ear: Tympanic membrane and external ear normal.     Nose: Nose normal. No congestion or rhinorrhea.     Mouth/Throat:     Mouth: Mucous membranes are moist.  Eyes:     General:        Right eye: No  discharge.        Left eye: No discharge.     Conjunctiva/sclera: Conjunctivae normal.     Pupils: Pupils are equal, round, and reactive to light.  Neck:     Thyroid: No thyromegaly.     Vascular: No JVD.  Cardiovascular:     Rate and Rhythm: Normal rate and regular rhythm.     Heart sounds: Normal heart sounds. No murmur heard.   No friction rub. No gallop.  Pulmonary:     Effort: Pulmonary effort is normal.     Breath sounds: Normal breath sounds. No wheezing or rhonchi.  Abdominal:     General: Bowel sounds are normal. There is no distension.     Palpations: Abdomen is soft. There is no mass.     Tenderness: There is no abdominal tenderness. There is no guarding.     Hernia: No hernia is present.  Musculoskeletal:        General: Normal range of motion.     Cervical back: Normal range of motion and neck supple.  Lymphadenopathy:     Cervical: No cervical adenopathy.  Skin:    General: Skin is warm and dry.  Neurological:     Mental Status: She is alert.     Deep Tendon Reflexes: Reflexes are normal and symmetric.    Wt Readings from Last 3 Encounters:  04/28/21 201 lb (91.2 kg)  04/10/21 203 lb (92.1 kg)  11/20/20 205 lb (93 kg)    BP 120/80    Pulse 80    Ht 5\' 11"  (1.803 m)    Wt 201 lb (91.2 kg)    LMP 04/18/2017 (Approximate)    BMI 28.03 kg/m   Assessment and Plan:  1. Essential (primary) hypertension Chronic.  Controlled.  Stable.  Blood pressure today is 120/80.  Continue amlodipine 10 mg 1/2 tablet daily and metoprolol 25 mg 1/2 tablet twice a day.  Will check CMP for current electrolytes and GFR.  We will recheck patient in 6 months. - amLODipine (NORVASC) 10 MG tablet; Take 0.5 tablets (5 mg total) by mouth daily.  Dispense: 45 tablet; Refill: 1 - metoprolol tartrate (LOPRESSOR) 25 MG tablet; Take 1/2 (one-half) tablet by mouth twice daily  Dispense: 90 tablet; Refill: 1 - Comprehensive Metabolic Panel (CMET)  2. Gastroesophageal reflux disease Chronic.   Controlled.  Stable.  Patient has excellent results on omeprazole 40 mg 1 twice a day.  Patient is able to eat most any type of food without any repercussions. - omeprazole (PRILOSEC) 40 MG capsule; TAKE  1 CAPSULE BY MOUTH TWICE DAILY. NEED APPOINTMENT FOR FURTHER REFILLS.  Dispense: 180 capsule; Refill: 1  3. Familial multiple lipoprotein-type hyperlipidemia Chronic.  Controlled.  Stable.  Continue simvastatin 40 mg once a day.  Will check lipid panel for current LDL.  In addition to medication patient's been encouraged to monitor cholesterol intake. - simvastatin (ZOCOR) 40 MG tablet; Take 1 tablet (40 mg total) by mouth daily.  Dispense: 90 tablet; Refill: 1 - Lipid Panel With LDL/HDL Ratio

## 2021-04-29 ENCOUNTER — Ambulatory Visit: Payer: BC Managed Care – PPO | Admitting: Physical Therapy

## 2021-04-29 LAB — COMPREHENSIVE METABOLIC PANEL
ALT: 20 IU/L (ref 0–32)
AST: 22 IU/L (ref 0–40)
Albumin/Globulin Ratio: 1.9 (ref 1.2–2.2)
Albumin: 4.3 g/dL (ref 3.8–4.9)
Alkaline Phosphatase: 101 IU/L (ref 44–121)
BUN/Creatinine Ratio: 20 (ref 9–23)
BUN: 14 mg/dL (ref 6–24)
Bilirubin Total: 0.5 mg/dL (ref 0.0–1.2)
CO2: 24 mmol/L (ref 20–29)
Calcium: 9.1 mg/dL (ref 8.7–10.2)
Chloride: 107 mmol/L — ABNORMAL HIGH (ref 96–106)
Creatinine, Ser: 0.7 mg/dL (ref 0.57–1.00)
Globulin, Total: 2.3 g/dL (ref 1.5–4.5)
Glucose: 98 mg/dL (ref 70–99)
Potassium: 4.2 mmol/L (ref 3.5–5.2)
Sodium: 143 mmol/L (ref 134–144)
Total Protein: 6.6 g/dL (ref 6.0–8.5)
eGFR: 101 mL/min/{1.73_m2} (ref >59)

## 2021-04-29 LAB — LIPID PANEL WITH LDL/HDL RATIO
Cholesterol, Total: 176 mg/dL (ref 100–199)
HDL: 55 mg/dL (ref >39)
LDL Chol Calc (NIH): 104 mg/dL — ABNORMAL HIGH (ref 0–99)
LDL/HDL Ratio: 1.9 ratio (ref 0.0–3.2)
Triglycerides: 91 mg/dL (ref 0–149)
VLDL Cholesterol Cal: 17 mg/dL (ref 5–40)

## 2021-05-04 ENCOUNTER — Other Ambulatory Visit: Payer: Self-pay

## 2021-05-04 ENCOUNTER — Ambulatory Visit: Payer: BC Managed Care – PPO | Admitting: Physical Therapy

## 2021-05-04 DIAGNOSIS — R293 Abnormal posture: Secondary | ICD-10-CM

## 2021-05-04 DIAGNOSIS — M542 Cervicalgia: Secondary | ICD-10-CM | POA: Diagnosis not present

## 2021-05-04 NOTE — Therapy (Unsigned)
[facility] [address] [place], [place], [address] Phone: [phone]   Fax:  [phone]  Physical Therapy Treatment  Patient Details  Name: Sherri Matthews MRN: [id number] Date of Birth: 1966-02-17 Referring Provider (PT): Joseph Berkshire MD   Encounter Date: 05/04/2021   PT End of Session - 05/04/21 1721     Visit Number 3    Number of Visits 17    Date for PT Re-Evaluation 06/16/21    Activity Tolerance Patient tolerated treatment well    Behavior During Therapy [facility] for tasks assessed/performed             Past Medical History:  Diagnosis Date   Anxiety    AV node dysfunction    tachycardia sees Dr. Lady Gary   Cervical disc disease    degenerative disk    Chronic abdominal pain    Heart murmur    Hyperlipidemia    Hypertension    IBS (irritable bowel syndrome)    Mitral valve prolapse    Palpitations    Sleep apnea    possible but not tested    Past Surgical History:  Procedure Laterality Date   APPENDECTOMY  2008   CARDIAC CATHETERIZATION N/A 09/12/2015   Procedure: Left Heart Cath and Coronary Angiography;  Surgeon: Marcina Millard, MD;  Location: [facility];  Service: Cardiovascular;  Laterality: N/A;   ESOPHAGOGASTRODUODENOSCOPY (EGD) WITH PROPOFOL N/A 04/18/2015   Procedure: ESOPHAGOGASTRODUODENOSCOPY (EGD) WITH PROPOFOL;  Surgeon: Midge Minium, MD;  Location: [facility];  Service: Endoscopy;  Laterality: N/A;   HERNIA REPAIR      There were no vitals filed for this visit.   Subjective Assessment - 05/04/21 1719     Subjective Patient reports doing well with added home exercise. She feels that PT has helped. She reports improved nocturnal pain. Patient reports that she has sleep apnea, but sleep isn't disturbed from neck pain. Patient reports feeling "tender" along midline C-spine and cervical paraspinals.    Pertinent History Pt is a 56 y.o. female referred to PT for cervical  pain. PMH includes: Anxiety, HLD, HTN, IBS, mitral valve prolapse, AV node dysfunction, Cervical DDD. Pt reports cervical pain has occurred for ~ 3 years now. Cervical pain has mild relief with medications, deep pressure with massage. Reports neck pain at B sides of neck and midline pain described as tightness, does endorse subtle RUE weakness with overhead ADL's and bathing. Pain also in shoulder blades described as burning ache. Cervical pain described as aching pain, does have sharp pain with cervical mobility such as extension. Pt denies N/t and sensation changes. Pt denies any pain relief besides heating pad b/t legsand medications. Endorsing inner groin cramping primarily at night when laying on her back but discloses history of LBP and low back issues. Pt denies saddle anesthesia, denies B/B changes, unrelenting night pain. Pain worsened by laying down at night, cervical extension, soreness in all other planes. Pt describes pain as a 8/10 NPS at worst, best 2/10 NPS, currently 3.5/10 NPS. 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 Pt's goal is to improve cervical neck pain and sleep without being woken up by pain.    Limitations House hold activities    Patient Stated Goals Improve pain, sleep better without being woken up with pain.    Pain Onset More than a month ago  OBJECTIVE FINDINGS  AROM Cervical flexion:  39 Cervical extension: 40 Lateral flexion: Right 34 , Left 23*  Cervical rotation: Right 70, Left 59* *Indicates pain       TREATMENT     Manual Therapy - for symptom modulation, soft tissue sensitivity and mobility, joint mobility, ROM    Manual cervical traction, intermittent; 10 sec on and 10 sec off PA mobilization, gr I-II with therapist's 1st MTPs in supine STM/DTM bilateral cervical paraspinals, bilateral Uts TPR R and L UT Manual upper trapezius stretching, 2x30 sec on either side     Therapeutic  Exercise - for improved soft tissue flexibility and extensibility as needed for ROM, cervical spine mobility    Deep neck flexor nod; 1x10, 5 sec    Pt edu: Reviewed and provided HEP handout with established home exercises and SNAG for cervical extension today    *not today* Cervical SNAG for extension following demo and tactile/verbal cueing for technique; 1x10     ASSESSMENT Patient arrives with excellent motivation to participate in physical therapy. Pt has responded well with initial intervention with improved tolerance of sleeping positions following previous visit and good response to manual intervention and SNAG technique for cervical spine extension today. She does not have notable paresthesias or upper limb referred symptoms, but she does experience intermittent periscapular symptoms. Mid-line posterior cervical spine pain is mitigated with use of traction. Pt has intermittent dizziness with changes in position on mat and may benefit from vestibular screen/referral for vestibular treatment. Patient has remaining deficits in deficits in cevical spine  extension, deep neck flexor endurance compared to age matched norms, C-spine facet joint accessory mobility, and forward head posture and rounded shoulders with weakness in periscapular mm. Patient will benefit from continued skilled therapeutic intervention to address the above deficits as needed for improved function and QoL.        PT Short Term Goals - 04/21/21 0841       PT SHORT TERM GOAL #1   Title Pt will be indep with HEP to improve pain and mobility with ADL's, sleep, and work related tasks    Baseline 04/21/21: Initiated    Time 4    Period Weeks    Status New    Target Date 05/18/21               PT Long Term Goals - 04/21/21 0842       PT LONG TERM GOAL #1   Title Pt will improve FOTO to target score of 58 to demonstrate clinically significant improvement in perceived functional mobility.    Baseline  04/20/21: 53    Time 8    Period Weeks    Status New    Target Date 06/15/21      PT LONG TERM GOAL #2   Title Pt will improve cervical AROM by at least 10 deg in all planes (except flexion as near normal limits) to demonstrate clinically significant improvement in cervical mobility for needed head turning ADL's.    Baseline 04/20/21: Flexion WNL, extension: 30 deg, R/L rotation: 64/55 , R/L lat flexion: 35/35    Time 8    Period Weeks    Status New    Target Date 06/15/21      PT LONG TERM GOAL #3   Title Pt will improve DNF endurance to adult matched norms (females: age matched norms of 29.4 sec) to demonstrate improved posture with seated and standing ADL's and reduced need for cervical extensor overactivation for cervical  stability.    Baseline 04/20/21: 16.46 sec    Time 8    Period Weeks    Status New    Target Date 06/15/21      PT LONG TERM GOAL #4   Title Pt will report a full night's sleep without being woken up from cervical pain to improve sleep quality.    Baseline 04/20/21: wakes up with cervical pain nightly.    Time 8    Period Weeks    Status New    Target Date 06/15/21                    Patient will benefit from skilled therapeutic intervention in order to improve the following deficits and impairments:     Visit Diagnosis: Cervicalgia  Abnormal posture     Problem List Patient Active Problem List   Diagnosis Date Noted   Tinea corporis 04/10/2021   Cervical spondylosis 11/20/2020   Segmental and somatic dysfunction of cervical region 11/20/2020   Obstructive sleep apnea 12/25/2019   Intractable migraine with aura without status migrainosus 06/21/2016   Chest pain 06/19/2016   Abnormal EKG 06/19/2016   Menopause 01/28/2016   Heart palpitations 10/09/2015   SOB (shortness of breath) 09/24/2015   Diverticulitis 06/04/2015   Lower back pain 06/04/2015   SVT (supraventricular tachycardia) (HCC) 06/04/2015   Essential hypertension  05/15/2015   Hyperlipidemia 05/15/2015   Gastritis    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 [place], [place], [address] Phone: [phone]   Fax:  [phone]  Name: Sherri Matthews MRN: [id number] Date of Birth: October 26, 1965

## 2021-05-06 ENCOUNTER — Encounter: Payer: Self-pay | Admitting: Physical Therapy

## 2021-05-06 ENCOUNTER — Ambulatory Visit: Payer: BC Managed Care – PPO | Admitting: Physical Therapy

## 2021-05-12 ENCOUNTER — Ambulatory Visit: Payer: BC Managed Care – PPO | Admitting: Physical Therapy

## 2021-05-12 ENCOUNTER — Other Ambulatory Visit: Payer: Self-pay

## 2021-05-12 ENCOUNTER — Encounter: Payer: Self-pay | Admitting: Physical Therapy

## 2021-05-12 DIAGNOSIS — M542 Cervicalgia: Secondary | ICD-10-CM

## 2021-05-12 DIAGNOSIS — R293 Abnormal posture: Secondary | ICD-10-CM

## 2021-05-12 NOTE — Therapy (Signed)
[place] ?[facility] ?[address] [place], [place], [address] ?Phone: [phone]   Fax:  [phone] ? ?Physical Therapy Treatment ? ?Patient Details  ?Name: Sherri Matthews ?MRN: [id number] ?Date of Birth: 1965/09/27 ?Referring Provider (PT): Joseph Berkshire MD ? ? ?Encounter Date: 05/12/2021 ? ? PT End of Session - 05/15/21 2208   ? ? Visit Number 4   ? Number of Visits 17   ? Date for PT Re-Evaluation 06/16/21   ? PT Start Time 1600   ? PT Stop Time 1644   ? PT Time Calculation (min) 44 min   ? Activity Tolerance Patient tolerated treatment well   ? Behavior During Therapy [facility] for tasks assessed/performed   ? ?  ?  ? ?  ? ? ?Past Medical History:  ?Diagnosis Date  ? Anxiety   ? AV node dysfunction   ? tachycardia sees Dr. Lady Gary  ? Cervical disc disease   ? degenerative disk   ? Chronic abdominal pain   ? Heart murmur   ? Hyperlipidemia   ? Hypertension   ? IBS (irritable bowel syndrome)   ? Mitral valve prolapse   ? Palpitations   ? Sleep apnea   ? possible but not tested  ? ? ?Past Surgical History:  ?Procedure Laterality Date  ? APPENDECTOMY  2008  ? CARDIAC CATHETERIZATION N/A 09/12/2015  ? Procedure: Left Heart Cath and Coronary Angiography;  Surgeon: Marcina Millard, MD;  Location: [facility];  Service: Cardiovascular;  Laterality: N/A;  ? ESOPHAGOGASTRODUODENOSCOPY (EGD) WITH PROPOFOL N/A 04/18/2015  ? Procedure: ESOPHAGOGASTRODUODENOSCOPY (EGD) WITH PROPOFOL;  Surgeon: Midge Minium, MD;  Location: [facility];  Service: Endoscopy;  Laterality: N/A;  ? HERNIA REPAIR    ? ? ?There were no vitals filed for this visit. ? ? Subjective Assessment - 05/15/21 2210   ? ? Subjective Patient reports that she may need to reduce her frequency due to her current schedule. Patient reports moderate HA at arrival to PT. She reports headache affecting frontal region. Patient reports sensation of tightness in her neck. Patient feels that therapy is helping. Pt  reports her dizziness is about the same presently. Pt reports dizziness with lying down that has occurred intermittently. Pt has undergone diagnostic workup for verrtigo previously, but she is unsure of results obtained from this.   ? Pertinent History Pt is a 56 y.o. female referred to PT for cervical pain. PMH includes: Anxiety, HLD, HTN, IBS, mitral valve prolapse, AV node dysfunction, Cervical DDD. Pt reports cervical pain has occurred for ~ 3 years now. Cervical pain has mild relief with medications, deep pressure with massage. Reports neck pain at B sides of neck and midline pain described as tightness, does endorse subtle RUE weakness with overhead ADL's and bathing. Pain also in shoulder blades described as burning ache. Cervical pain described as aching pain, does have sharp pain with cervical mobility such as extension. Pt denies N/t and sensation changes. Pt denies any pain relief besides heating pad b/t legsand medications. Endorsing inner groin cramping primarily at night when laying on her back but discloses history of LBP and low back issues. Pt denies saddle anesthesia, denies B/B changes, unrelenting night pain. Pain worsened by laying down at night, cervical extension, soreness in all other planes. Pt describes pain as a 8/10 NPS at worst, best 2/10 NPS, currently 3.5/10 NPS. 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 Pt's goal is to improve cervical neck  pain and sleep without being woken up by pain.   ? Limitations House hold activities   ? Patient Stated Goals Improve pain, sleep better without being woken up with pain.   ? Pain Onset More than a month ago   ? ?  ?  ? ?  ? ? ? ?  ?  ?OBJECTIVE FINDINGS ? ?  ?Supine Head Roll Test: R Negative, L Negative  ?Dix-Hallpike Test: R Negative, L Negative ? ?  ?  ?TREATMENT ?  ?  ?Manual Therapy - for symptom modulation, soft tissue sensitivity and mobility, joint mobility, ROM  ?  ?Manual cervical traction, intermittent; 10 sec on and 10 sec  off ?PA mobilization, gr I-II with therapist's 1st MTPs in supine ?STM/DTM bilateral cervical paraspinals, bilateral upper traps ?Bilateral sideglides C4-6; gr II for pain control, gr III for joint mobility ?Manual upper trapezius stretching, 2x30 sec on either side ?  ?  ?*not today* ?TPR R and L UT ?  ?Therapeutic Exercise - for improved soft tissue flexibility and extensibility as needed for ROM, cervical spine mobility  ?  ?  ?Deep neck flexor nod; 1x10, 5 sec ? ?Pt edu: Reviewed self-traction technique that pt can safely utilize for symptom modulation during time in between PT visits. Updated and reviewed HEP ?  ?  ?  ?  ?*not today* ?Cervical SNAG for extension following demo and tactile/verbal cueing for technique; 1x10 ?  ?  ?ASSESSMENT ?Patient does not demonstrate significant nystagmus and has minimal to mild symptom reproduction with quick BPPV screening today. Patient has responded well with PT to date and demonstrates improving cervical spine AROM and notably improved reports of pain. Pt does have frontal headache and responds well with upper cervical distraction and suboccipital soft tissue work. Patient will benefit from continued skilled therapeutic intervention to address the above deficits as needed for improved function and QoL. ?  ?  ?  ?  ? ? ? ? PT Short Term Goals - 04/21/21 0841   ? ?  ? PT SHORT TERM GOAL #1  ? Title Pt will be indep with HEP to improve pain and mobility with ADL's, sleep, and work related tasks   ? Baseline 04/21/21: Initiated   ? Time 4   ? Period Weeks   ? Status New   ? Target Date 05/18/21   ? ?  ?  ? ?  ? ? ? ? PT Long Term Goals - 04/21/21 0842   ? ?  ? PT LONG TERM GOAL #1  ? Title Pt will improve FOTO to target score of 58 to demonstrate clinically significant improvement in perceived functional mobility.   ? Baseline 04/20/21: 53   ? Time 8   ? Period Weeks   ? Status New   ? Target Date 06/15/21   ?  ? PT LONG TERM GOAL #2  ? Title Pt will improve cervical AROM by  at least 10 deg in all planes (except flexion as near normal limits) to demonstrate clinically significant improvement in cervical mobility for needed head turning ADL's.   ? Baseline 04/20/21: Flexion WNL, extension: 30 deg, R/L rotation: 64/55 , R/L lat flexion: 35/35   ? Time 8   ? Period Weeks   ? Status New   ? Target Date 06/15/21   ?  ? PT LONG TERM GOAL #3  ? Title Pt will improve DNF endurance to adult matched norms (females: age matched norms of 29.4 sec) to demonstrate improved  posture with seated and standing ADL's and reduced need for cervical extensor overactivation for cervical stability.   ? Baseline 04/20/21: 16.46 sec   ? Time 8   ? Period Weeks   ? Status New   ? Target Date 06/15/21   ?  ? PT LONG TERM GOAL #4  ? Title Pt will report a full night's sleep without being woken up from cervical pain to improve sleep quality.   ? Baseline 04/20/21: wakes up with cervical pain nightly.   ? Time 8   ? Period Weeks   ? Status New   ? Target Date 06/15/21   ? ?  ?  ? ?  ? ? ? ? ? ? ? ? Plan - 05/15/21 2215   ? ? Clinical Impression Statement Patient does not demonstrate significant nystagmus and has minimal to mild symptom reproduction with quick BPPV screening today. Patient has responded well with PT to date and demonstrates improving cervical spine AROM and notably improved reports of pain. Pt does have frontal headache and responds well with upper cervical distraction and suboccipital soft tissue work. Patient will benefit from continued skilled therapeutic intervention to address the above deficits as needed for improved function and QoL.   ? Personal Factors and Comorbidities Age;Comorbidity 3+;Time since onset of injury/illness/exacerbation   ? Comorbidities Anxiety, HLD, HTN, IBS, mitral valve prolapse, AV node dysfunction, Cervical DDD.   ? Examination-Activity Limitations Stand;Sleep;Hygiene/Grooming   ? Examination-Participation Restrictions Community Activity;Occupation;Driving   ?  Stability/Clinical Decision Making Evolving/Moderate complexity   ? Rehab Potential Good   ? PT Frequency 2x / week   ? PT Duration 8 weeks   ? PT Treatment/Interventions ADLs/Self Care Home Management;Canalith Reposti

## 2021-05-14 ENCOUNTER — Encounter: Payer: BC Managed Care – PPO | Admitting: Physical Therapy

## 2021-05-19 ENCOUNTER — Encounter: Payer: BC Managed Care – PPO | Admitting: Physical Therapy

## 2021-05-21 ENCOUNTER — Encounter: Payer: BC Managed Care – PPO | Admitting: Physical Therapy

## 2021-05-22 ENCOUNTER — Ambulatory Visit: Payer: BC Managed Care – PPO | Admitting: Family Medicine

## 2021-05-26 ENCOUNTER — Encounter: Payer: BC Managed Care – PPO | Admitting: Physical Therapy

## 2021-05-28 ENCOUNTER — Encounter: Payer: BC Managed Care – PPO | Admitting: Physical Therapy

## 2021-08-03 ENCOUNTER — Other Ambulatory Visit: Payer: Self-pay | Admitting: Family Medicine

## 2021-08-03 DIAGNOSIS — Z1231 Encounter for screening mammogram for malignant neoplasm of breast: Secondary | ICD-10-CM

## 2021-08-27 ENCOUNTER — Telehealth: Payer: Self-pay | Admitting: Family Medicine

## 2021-08-27 ENCOUNTER — Other Ambulatory Visit: Payer: Self-pay | Admitting: Family Medicine

## 2021-08-27 ENCOUNTER — Ambulatory Visit
Admission: RE | Admit: 2021-08-27 | Discharge: 2021-08-27 | Disposition: A | Discharge status: Home / Self Care | Payer: BC Managed Care – PPO | Source: Ambulatory Visit | Attending: Family Medicine | Admitting: Family Medicine

## 2021-08-27 DIAGNOSIS — Z1231 Encounter for screening mammogram for malignant neoplasm of breast: Secondary | ICD-10-CM

## 2021-08-27 DIAGNOSIS — N6322 Unspecified lump in the left breast, upper inner quadrant: Secondary | ICD-10-CM

## 2021-08-27 NOTE — Telephone Encounter (Signed)
Copied from CRM [id number]. Topic: Referral - Question >> Aug 27, 2021  3:12 PM Teressa P wrote: Reason for CRM: Pt went for her mammogram this afternoon but when she got there she told them she had a lump.  They told her to call the office for them to send an order over for a diagnostic mammogram.  CB# [phone]

## 2021-08-31 ENCOUNTER — Ambulatory Visit
Admission: RE | Admit: 2021-08-31 | Discharge: 2021-08-31 | Disposition: A | Discharge status: Home / Self Care | Payer: BC Managed Care – PPO | Source: Ambulatory Visit | Attending: Family Medicine | Admitting: Family Medicine

## 2021-08-31 DIAGNOSIS — N6322 Unspecified lump in the left breast, upper inner quadrant: Secondary | ICD-10-CM | POA: Diagnosis present

## 2021-09-01 ENCOUNTER — Other Ambulatory Visit: Payer: Self-pay | Admitting: Family Medicine

## 2021-09-01 DIAGNOSIS — N6321 Unspecified lump in the left breast, upper outer quadrant: Secondary | ICD-10-CM

## 2021-09-14 ENCOUNTER — Other Ambulatory Visit: Payer: BC Managed Care – PPO

## 2021-09-22 ENCOUNTER — Ambulatory Visit
Admission: RE | Admit: 2021-09-22 | Discharge: 2021-09-22 | Disposition: A | Discharge status: Home / Self Care | Payer: BC Managed Care – PPO | Source: Ambulatory Visit | Attending: Family Medicine | Admitting: Family Medicine

## 2021-09-22 DIAGNOSIS — N6321 Unspecified lump in the left breast, upper outer quadrant: Secondary | ICD-10-CM | POA: Insufficient documentation

## 2021-09-22 HISTORY — PX: BREAST BIOPSY: SHX20

## 2021-09-23 LAB — SURGICAL PATHOLOGY

## 2021-10-23 ENCOUNTER — Encounter: Payer: Self-pay | Admitting: Family Medicine

## 2021-10-23 ENCOUNTER — Ambulatory Visit: Payer: BC Managed Care – PPO | Admitting: Family Medicine

## 2021-10-23 VITALS — BP 130/70 | HR 80 | Ht 71.0 in | Wt 218.0 lb

## 2021-10-23 DIAGNOSIS — E7849 Other hyperlipidemia: Secondary | ICD-10-CM

## 2021-10-23 DIAGNOSIS — I10 Essential (primary) hypertension: Secondary | ICD-10-CM | POA: Diagnosis not present

## 2021-10-23 DIAGNOSIS — K219 Gastro-esophageal reflux disease without esophagitis: Secondary | ICD-10-CM

## 2021-10-23 DIAGNOSIS — F064 Anxiety disorder due to known physiological condition: Secondary | ICD-10-CM | POA: Diagnosis not present

## 2021-10-23 MED ORDER — SIMVASTATIN 40 MG PO TABS: 40.0000 mg | ORAL_TABLET | Freq: Every day | ORAL | 1 refills | Status: DC

## 2021-10-23 MED ORDER — OMEPRAZOLE 40 MG PO CPDR: DELAYED_RELEASE_CAPSULE | ORAL | 1 refills | Status: DC

## 2021-10-23 MED ORDER — ALPRAZOLAM 0.5 MG PO TABS: ORAL_TABLET | ORAL | 5 refills | Status: DC

## 2021-10-23 MED ORDER — METOPROLOL TARTRATE 25 MG PO TABS: ORAL_TABLET | ORAL | 1 refills | Status: DC

## 2021-10-23 MED ORDER — AMLODIPINE BESYLATE 10 MG PO TABS: 5.0000 mg | ORAL_TABLET | Freq: Every day | ORAL | 1 refills | Status: DC

## 2021-10-23 NOTE — Progress Notes (Signed)
Date:  10/23/2021   Name:  Sherri Matthews   DOB:  1965-07-30   MRN:  [id number]   Chief Complaint: Gastroesophageal Reflux, Hypertension, Hyperlipidemia, and Anxiety  Gastroesophageal Reflux She complains of dysphagia. She reports no abdominal pain, no chest pain, no heartburn, no nausea or no sore throat. This is a chronic problem. The current episode started more than 1 year ago. The problem has been waxing and waning. The symptoms are aggravated by certain foods. Pertinent negatives include no anemia, fatigue, melena, muscle weakness, orthopnea or weight loss. She has tried a PPI for the symptoms. The treatment provided moderate relief.  Hypertension This is a chronic problem. The current episode started more than 1 year ago. The problem has been gradually improving since onset. Associated symptoms include anxiety. Pertinent negatives include no blurred vision, chest pain, headaches, malaise/fatigue, neck pain, orthopnea, palpitations, peripheral edema, PND, shortness of breath or sweats. There are no associated agents to hypertension. Risk factors for coronary artery disease include dyslipidemia. Past treatments include beta blockers and calcium channel blockers. The current treatment provides moderate improvement. There are no compliance problems.  There is no history of chronic renal disease, a hypertension causing med or renovascular disease.  Hyperlipidemia This is a chronic problem. The current episode started more than 1 year ago. The problem is controlled. Recent lipid tests were reviewed and are normal. She has no history of chronic renal disease, diabetes, hypothyroidism, liver disease, obesity or nephrotic syndrome. There are no known factors aggravating her hyperlipidemia. Pertinent negatives include no chest pain or shortness of breath. Current antihyperlipidemic treatment includes statins. The current treatment provides moderate improvement of lipids. There are no compliance  problems.   Anxiety Presents for follow-up visit. Symptoms include nervous/anxious behavior. Patient reports no chest pain, excessive worry, nausea, palpitations or shortness of breath.      Lab Results  Component Value Date   NA 143 04/28/2021   K 4.2 04/28/2021   CO2 24 04/28/2021   GLUCOSE 98 04/28/2021   BUN 14 04/28/2021   CREATININE 0.70 04/28/2021   CALCIUM 9.1 04/28/2021   EGFR 101 04/28/2021   GFRNONAA 80 05/17/2019   Lab Results  Component Value Date   CHOL 176 04/28/2021   HDL 55 04/28/2021   LDLCALC 104 (H) 04/28/2021   TRIG 91 04/28/2021   CHOLHDL 4.6 (H) 02/24/2018   Lab Results  Component Value Date   TSH 0.910 11/01/2017   No results found for: "HGBA1C" Lab Results  Component Value Date   WBC 5.2 05/22/2020   HGB 13.2 05/22/2020   HCT 40.6 05/22/2020   MCV 84 05/22/2020   PLT 265 05/22/2020   Lab Results  Component Value Date   ALT 20 04/28/2021   AST 22 04/28/2021   ALKPHOS 101 04/28/2021   BILITOT 0.5 04/28/2021   No results found for: "25OHVITD2", "25OHVITD3", "VD25OH"   Review of Systems  Constitutional:  Negative for fatigue, malaise/fatigue and weight loss.  HENT:  Negative for sore throat.   Eyes:  Negative for blurred vision.  Respiratory:  Negative for shortness of breath.   Cardiovascular:  Negative for chest pain, palpitations, orthopnea and PND.  Gastrointestinal:  Positive for dysphagia. Negative for abdominal pain, heartburn, melena and nausea.  Musculoskeletal:  Negative for muscle weakness and neck pain.  Neurological:  Negative for headaches.  Psychiatric/Behavioral:  The patient is nervous/anxious.     Patient Active Problem List   Diagnosis Date Noted   Tinea corporis 04/10/2021  Cervical spondylosis 11/20/2020   Segmental and somatic dysfunction of cervical region 11/20/2020   Obstructive sleep apnea 12/25/2019   Intractable migraine with aura without status migrainosus 06/21/2016   Chest pain 06/19/2016    Abnormal EKG 06/19/2016   Menopause 01/28/2016   Heart palpitations 10/09/2015   SOB (shortness of breath) 09/24/2015   Diverticulitis 06/04/2015   Lower back pain 06/04/2015   SVT (supraventricular tachycardia) ([place]) 06/04/2015   Essential hypertension 05/15/2015   Hyperlipidemia 05/15/2015   Gastritis    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     Vicodin [Hydrocodone-Acetaminophen] Palpitations   Dilaudid [Hydromorphone Hcl] Other (See Comments)   Fenofibrate Other (See Comments)    Myalgia    Past Surgical History:  Procedure Laterality Date   APPENDECTOMY  02/22/2006   BREAST BIOPSY Left 09/22/2021   u/s bx, mass 1:00, RIBBON clip-path pending   BREAST CYST ASPIRATION     CARDIAC CATHETERIZATION N/A 09/12/2015   Procedure: Left Heart Cath and Coronary Angiography;  Surgeon: Isaias Cowman, MD;  Location: [place] [facility];  Service: Cardiovascular;  Laterality: N/A;   ESOPHAGOGASTRODUODENOSCOPY (EGD) WITH PROPOFOL N/A 04/18/2015   Procedure: ESOPHAGOGASTRODUODENOSCOPY (EGD) WITH PROPOFOL;  Surgeon: Lucilla Lame, MD;  Location: [place];  Service: Endoscopy;  Laterality: N/A;   HERNIA REPAIR      Social History   Tobacco Use   Smoking status: Never   Smokeless tobacco: Never  Vaping Use   Vaping Use: Never used  Substance Use Topics   Alcohol use: Not Currently   Drug use: Never     Medication list has been reviewed and updated.  Current Meds  Medication Sig   ALPRAZolam (XANAX)  0.5 MG tablet TAKE ONE TABLET BY MOUTH ONCE DAILY AS NEEDED   amLODipine (NORVASC) 10 MG tablet Take 0.5 tablets (5 mg total) by mouth daily.   B Complex-C (SUPER B COMPLEX PO) Take 1 tablet by mouth daily.   BIOTIN PO Take 1 capsule by mouth daily.   clotrimazole-betamethasone (LOTRISONE) cream Apply 1 application topically 2 (two) times daily. X 1 week   diclofenac (VOLTAREN) 75 MG EC tablet Take 1 tablet (75 mg total) by mouth 2 (two) times daily.   Magnesium 250 MG TABS Take 1 tablet by mouth daily.   metoprolol tartrate (LOPRESSOR) 25 MG tablet Take 1/2 (one-half) tablet by mouth twice daily   omeprazole (PRILOSEC) 40 MG capsule TAKE 1 CAPSULE BY MOUTH TWICE DAILY. NEED APPOINTMENT FOR FURTHER REFILLS.   simvastatin (ZOCOR) 40 MG tablet Take 1 tablet (40 mg total) by mouth daily.       10/23/2021    9:34 AM 04/28/2021    8:06 AM 04/10/2021    3:53 PM 11/20/2020    3:27 PM  GAD 7 : Generalized Anxiety Score  Nervous, Anxious, on Edge 0 0 0 1  Control/stop worrying 0 0 0 0  Worry too much - different things 0 0 0 1  Trouble relaxing 0 0 0 1  Restless 0 0 0 0  Easily annoyed or irritable 0 0 0 0  Afraid -  awful might happen 0 0 0 1  Total GAD 7 Score 0 0 0 4  Anxiety Difficulty Not difficult at all Not difficult at all  Not difficult at all       10/23/2021    9:34 AM 04/28/2021    8:06 AM 04/10/2021    3:53 PM  Depression screen PHQ 2/9  Decreased Interest 0 0 0  Down, Depressed, Hopeless 0 0 0  PHQ - 2 Score 0 0 0  Altered sleeping 0 0 0  Tired, decreased energy 0 0 3  Change in appetite 0 0 0  Feeling bad or failure about yourself  0 0 0  Trouble concentrating 0 0 1  Moving slowly or fidgety/restless 0 0 0  Suicidal thoughts 0 0 0  PHQ-9 Score 0 0 4  Difficult doing work/chores Not difficult at all Not difficult at all Not difficult at all    BP Readings from Last 3 Encounters:  10/23/21 130/70  04/28/21 120/80  04/10/21 124/76    Physical Exam Vitals and nursing  note reviewed. Exam conducted with a chaperone present.  Constitutional:      General: She is not in acute distress.    Appearance: She is not diaphoretic.  HENT:     Head: Normocephalic and atraumatic.     Right Ear: Tympanic membrane and external ear normal.     Left Ear: Tympanic membrane and external ear normal.     Nose: Nose normal.     Mouth/Throat:     Mouth: Mucous membranes are moist.  Eyes:     General:        Right eye: No discharge.        Left eye: No discharge.     Conjunctiva/sclera: Conjunctivae normal.     Pupils: Pupils are equal, round, and reactive to light.  Neck:     Thyroid: No thyromegaly.     Vascular: No JVD.  Cardiovascular:     Rate and Rhythm: Normal rate and regular rhythm.     Heart sounds: Normal heart sounds. No murmur heard.    No friction rub. No gallop.  Pulmonary:     Effort: Pulmonary effort is normal.     Breath sounds: Normal breath sounds. No wheezing, rhonchi or rales.  Abdominal:     General: Bowel sounds are normal.     Palpations: Abdomen is soft. There is no mass.     Tenderness: There is no abdominal tenderness. There is no guarding.  Musculoskeletal:        General: Normal range of motion.     Cervical back: Normal range of motion and neck supple.  Lymphadenopathy:     Cervical: No cervical adenopathy.  Skin:    General: Skin is warm and dry.  Neurological:     Mental Status: She is alert.     Deep Tendon Reflexes: Reflexes are normal and symmetric.     Wt Readings from Last 3 Encounters:  10/23/21 218 lb (98.9 kg)  04/28/21 201 lb (91.2 kg)  04/10/21 203 lb (92.1 kg)    BP 130/70   Pulse 80   Ht '5\' 11"'  (1.803 m)   Wt 218 lb (98.9 kg)   LMP 04/18/2017 (Approximate)   BMI 30.40 kg/m   Assessment and Plan:  1. Essential (primary) hypertension Chronic.  Controlled.  Stable.  Blood pressure is 130/70.  Metoprolol 25 mg 1 tablet we will recheck in 6 months. - amLODipine (NORVASC) 10 MG tablet; Take 0.5  tablets  (5 mg total) by mouth daily.  Dispense: 45 tablet; Refill: 1 - metoprolol tartrate (LOPRESSOR) 25 MG tablet; Take 1/2 (one-half) tablet by mouth twice daily  Dispense: 90 tablet; Refill: 1 - ALPRAZolam (XANAX) 0.5 MG tablet; TAKE ONE TABLET BY MOUTH ONCE DAILY AS NEEDED  Dispense: 30 tablet; Refill: 5  2. Gastroesophageal reflux disease Chronic.  Controlled.  Stable.  Patient has had a relatively recent upper endoscopy and colonoscopy.  Continue omeprazole 40 mg once a day. - omeprazole (PRILOSEC) 40 MG capsule; TAKE 1 CAPSULE BY MOUTH TWICE DAILY.  Dispense: 180 capsule; Refill: 1  3. Familial multiple lipoprotein-type hyperlipidemia Chronic.  Controlled.  Stable.  Continue simvastatin 40 mg once a day. - simvastatin (ZOCOR) 40 MG tablet; Take 1 tablet (40 mg total) by mouth daily.  Dispense: 90 tablet; Refill: 1  4. Anxiety disorder due to known physiological condition Controlled.  Patient uses sparingly and only when needed panic we will refill her alprazolam 0.5 mg - ALPRAZolam (XANAX) 0.5 MG tablet; TAKE ONE TABLET BY MOUTH ONCE DAILY AS NEEDED  Dispense: 30 tablet; Refill: 5    Otilio Miu, MD

## 2021-10-29 ENCOUNTER — Ambulatory Visit: Payer: BC Managed Care – PPO | Admitting: Family Medicine

## 2021-11-09 ENCOUNTER — Ambulatory Visit
Admission: EM | Admit: 2021-11-09 | Discharge: 2021-11-09 | Payer: BC Managed Care – PPO | Attending: Emergency Medicine | Admitting: Emergency Medicine

## 2021-11-09 ENCOUNTER — Emergency Department
Admission: EM | Admit: 2021-11-09 | Discharge: 2021-11-09 | Disposition: A | Discharge status: Home / Self Care | Payer: BC Managed Care – PPO | Attending: Emergency Medicine | Admitting: Emergency Medicine

## 2021-11-09 ENCOUNTER — Encounter: Payer: Self-pay | Admitting: Emergency Medicine

## 2021-11-09 ENCOUNTER — Emergency Department: Payer: BC Managed Care – PPO

## 2021-11-09 DIAGNOSIS — R0602 Shortness of breath: Secondary | ICD-10-CM | POA: Diagnosis not present

## 2021-11-09 DIAGNOSIS — I447 Left bundle-branch block, unspecified: Secondary | ICD-10-CM

## 2021-11-09 DIAGNOSIS — R079 Chest pain, unspecified: Secondary | ICD-10-CM | POA: Diagnosis present

## 2021-11-09 DIAGNOSIS — R519 Headache, unspecified: Secondary | ICD-10-CM | POA: Insufficient documentation

## 2021-11-09 DIAGNOSIS — R42 Dizziness and giddiness: Secondary | ICD-10-CM | POA: Diagnosis not present

## 2021-11-09 DIAGNOSIS — R0789 Other chest pain: Secondary | ICD-10-CM | POA: Diagnosis not present

## 2021-11-09 LAB — CBC
HCT: 40.6 % (ref 36.0–46.0)
Hemoglobin: 13.2 g/dL (ref 12.0–15.0)
MCH: 28.2 pg (ref 26.0–34.0)
MCHC: 32.5 g/dL (ref 30.0–36.0)
MCV: 86.8 fL (ref 80.0–100.0)
Platelets: 256 10*3/uL (ref 150–400)
RBC: 4.68 MIL/uL (ref 3.87–5.11)
RDW: 12.5 % (ref 11.5–15.5)
WBC: 6.5 10*3/uL (ref 4.0–10.5)
nRBC: 0 % (ref 0.0–0.2)

## 2021-11-09 LAB — BASIC METABOLIC PANEL
Anion gap: 8 (ref 5–15)
BUN: 10 mg/dL (ref 6–20)
CO2: 25 mmol/L (ref 22–32)
Calcium: 9.4 mg/dL (ref 8.9–10.3)
Chloride: 110 mmol/L (ref 98–111)
Creatinine, Ser: 0.67 mg/dL (ref 0.44–1.00)
GFR, Estimated: >60 mL/min (ref >60)
Glucose, Bld: 112 mg/dL — ABNORMAL HIGH (ref 70–99)
Potassium: 4 mmol/L (ref 3.5–5.1)
Sodium: 143 mmol/L (ref 135–145)

## 2021-11-09 LAB — TROPONIN I (HIGH SENSITIVITY)
Troponin I (High Sensitivity): 3 ng/L (ref <18)
Troponin I (High Sensitivity): 4 ng/L (ref <18)

## 2021-11-09 MED ORDER — ASPIRIN 81 MG PO CHEW
324.0000 mg | CHEWABLE_TABLET | Freq: Once | ORAL | Status: AC
  Administered 2021-11-09: 324 mg via ORAL

## 2021-11-09 MED ORDER — PROCHLORPERAZINE EDISYLATE 10 MG/2ML IJ SOLN
10.0000 mg | Freq: Once | INTRAMUSCULAR | Status: AC
  Administered 2021-11-09: 10 mg via INTRAVENOUS
  Filled 2021-11-09: qty 2

## 2021-11-09 MED ORDER — LIDOCAINE VISCOUS HCL 2 % MT SOLN
15.0000 mL | Freq: Once | OROMUCOSAL | Status: AC
  Administered 2021-11-09: 15 mL via ORAL
  Filled 2021-11-09: qty 15

## 2021-11-09 MED ORDER — ALUM & MAG HYDROXIDE-SIMETH 200-200-20 MG/5ML PO SUSP
30.0000 mL | Freq: Once | ORAL | Status: AC
  Administered 2021-11-09: 30 mL via ORAL
  Filled 2021-11-09: qty 30

## 2021-11-09 NOTE — ED Triage Notes (Signed)
Pt states she woke up around midnight and saw zig zag lights. She took her BP and it was 196/99 and repeat 166/100 and she took a final BP it was 146/92. She has some chest tightness and pain around her left wrist.

## 2021-11-09 NOTE — ED Triage Notes (Signed)
Cp sent from urgent care , cardiac history

## 2021-11-09 NOTE — Discharge Instructions (Addendum)
Please go to [facility] ER for further evaluation of your chest tightness, dizziness, shortness breath, and EKG changes.

## 2021-11-09 NOTE — ED Notes (Signed)
Patient is being discharged from the Urgent Care and sent to the Emergency Department via EMS . Per Margarette Canada NP, patient is in need of higher level of care due to hypertension, vision changes. Patient is aware and verbalizes understanding of plan of care.  Vitals:   11/09/21 0817  BP: (!) 143/67  Pulse: 64  Resp: 16  Temp: 98.6 F (37 C)  SpO2: 97%

## 2021-11-09 NOTE — Discharge Instructions (Signed)
Please seek medical attention for any high fevers, chest pain, shortness of breath, change in behavior, persistent vomiting, bloody stool or any other new or concerning symptoms.  

## 2021-11-09 NOTE — ED Provider Notes (Signed)
[facility] Provider Note    Event Date/Time   First MD Initiated Contact with Patient 11/09/21 [phone]     (approximate)   History   Chest Pain   HPI  Sherri Matthews is a 56 y.o. female who presents to the emergency department today via EMS because of concerns for chest tightness.  Patient initially went to urgent care.  She states that she woke up around 12:30 AM this morning with the symptoms concerning for ocular migraine.  She had some vision changes as well as left-sided headache.  However she noticed at that time which she describes as chest tightness.  Located across her chest and in between her shoulder blades.  Did feel like it made it hard for her to take a deep breath.  She also found her blood pressure to be high.  She tried taking her amlodipine early around 4 AM.  In addition she states that she has history of heartburn and has noticed occasional episodes over the past couple of weeks of some upper chest discomfort which she thought might be related. She denies any unusual activity or exertion. Denies any leg swelling.       Physical Exam   Triage Vital Signs: ED Triage Vitals [11/09/21 0938]  Enc Vitals Group     BP (!) 145/71     Pulse Rate 76     Resp 17     Temp 98.5 F (36.9 C)     Temp Source Oral     SpO2 98 %     Weight 220 lb 7.4 oz (100 kg)     Height 5\' 11"  (1.803 m)     Head Circumference      Peak Flow      Pain Score 0     Pain Loc      Pain Edu?      Excl. in [place]?     Most recent vital signs: Vitals:   11/09/21 0938 11/09/21 0943  BP: (!) 145/71 (!) 158/82  Pulse: 76 65  Resp: 17 13  Temp: 98.5 F (36.9 C)   SpO2: 98% 99%    General: Awake, alert, oriented. CV:  Good peripheral perfusion. Regular rate and rhythm. Resp:  Normal effort. Lungs clear. Abd:  No distention. Non tender.   ED Results / Procedures / Treatments   Labs (all labs ordered are listed, but only abnormal results are displayed) Labs  Reviewed  BASIC METABOLIC PANEL  CBC  POC URINE PREG, ED  TROPONIN I (HIGH SENSITIVITY)     EKG  I, Nance Pear, attending physician, personally viewed and interpreted this EKG  EKG Time: 0941 Rate: 73 Rhythm: sinus rhythm Axis: normal Intervals: qtc 480 QRS: LBBB ST changes: no st elevation Impression: abnormal ekg   RADIOLOGY  I independently interpreted and visualized the CXR. My interpretation: No pneumonia.  Radiology interpretation:  IMPRESSION:  No acute cardiopulmonary abnormality.     PROCEDURES:  Critical Care performed: No  Procedures   MEDICATIONS ORDERED IN ED: Medications - No data to display   IMPRESSION / MDM / [place] / ED COURSE  I reviewed the triage vital signs and the nursing notes.                              Differential diagnosis includes, but is not limited to, acs, pneumonia, pneumothorax, PE, gerd.   Patient's presentation is most consistent with  acute presentation with potential threat to life or bodily function.  Patient presented to the emergency department today because of concerns for which she describes as chest tightness.  Work-up with negative troponin x2. EKG shows LBBB but no st elevation equivalent.  Chest x-ray without concerning pneumonia.  Patient is neither hypoxic nor tachycardic nor a smoker nor on estrogen-containing medications.  No lower extremity edema.  At this time I have very low concern for PE.  Did try GI cocktail without any significant relief.  Patient also then started complaining of continued headache so Compazine was given.  She states she did feel somewhat better although sleepy after the Compazine.  Given reassuring work-up I feel it is safe for patient be discharged home.  Did discuss with patient importance of following up with her doctors.  FINAL CLINICAL IMPRESSION(S) / ED DIAGNOSES   Final diagnoses:  Nonspecific chest pain      Note:  This document was prepared using Dragon  voice recognition software and may include unintentional dictation errors.    Phineas Semen, MD 11/09/21 1420

## 2021-11-09 NOTE — ED Provider Notes (Signed)
[facility]    CSN: [id number] Arrival date & time: 11/09/21  0804      History   Chief Complaint Chief Complaint  Patient presents with   Hypertension    HPI Sherri Matthews is a 56 y.o. female.   HPI  56 year old female here for evaluation of chest tightness, increased blood pressure, left arm achiness, and zigzag lights in her vision.  Patient reports that she woke up around midnight and noticed the zigzag lines in her right eye.  She states that she has had these previously and usually gets 1 a year but has had for these in the last month.  They are currently resolved.  Typically they last 20 to 30 minutes when she lies down in a dark room.  She describes a feeling of "not well" and describes central chest tightness that does not radiate at a level of 4/10, dizziness, and shortness of breath.  She states the left arm achiness has resolved.  She took her amlodipine at 4 AM and then took another half dose at 530 due to having elevated blood pressure.  Patient's initial blood pressure at 4 AM was 196/99 followed by 166/100.  She denies any headache, sweating, or nausea.  Past Medical History:  Diagnosis Date   Anxiety    AV node dysfunction    tachycardia sees Dr. Ubaldo Glassing   Cervical disc disease    degenerative disk    Chronic abdominal pain    Heart murmur    Hyperlipidemia    Hypertension    IBS (irritable bowel syndrome)    Mitral valve prolapse    Palpitations    Sleep apnea    possible but not tested    Patient Active Problem List   Diagnosis Date Noted   Tinea corporis 04/10/2021   Cervical spondylosis 11/20/2020   Segmental and somatic dysfunction of cervical region 11/20/2020   Obstructive sleep apnea 12/25/2019   Intractable migraine with aura without status migrainosus 06/21/2016   Chest pain 06/19/2016   Abnormal EKG 06/19/2016   Menopause 01/28/2016   Heart palpitations 10/09/2015   SOB (shortness of breath) 09/24/2015   Diverticulitis  06/04/2015   Lower back pain 06/04/2015   SVT (supraventricular tachycardia) ([place]) 06/04/2015   Essential hypertension 05/15/2015   Hyperlipidemia 05/15/2015   Gastritis    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   Anxiety disorder due to known physiological condition 07/16/2014   DDD (degenerative disc disease), lumbosacral 07/16/2014   Heart murmur 07/16/2014   Gastroesophageal reflux disease 07/16/2014   H/O: osteoarthritis 07/16/2014    Past Surgical History:  Procedure Laterality Date   APPENDECTOMY  02/22/2006   BREAST BIOPSY Left 09/22/2021   u/s bx, mass 1:00, RIBBON clip-path pending   BREAST CYST ASPIRATION     CARDIAC CATHETERIZATION N/A 09/12/2015   Procedure: Left Heart Cath and Coronary Angiography;  Surgeon: Isaias Cowman, MD;  Location: [place] [facility];  Service: Cardiovascular;  Laterality: N/A;   ESOPHAGOGASTRODUODENOSCOPY (EGD) WITH PROPOFOL N/A 04/18/2015   Procedure: ESOPHAGOGASTRODUODENOSCOPY (EGD) WITH PROPOFOL;  Surgeon: Lucilla Lame, MD;  Location: [place];  Service: Endoscopy;  Laterality: N/A;   HERNIA REPAIR      OB History   No obstetric history on file.      Home Medications    Prior to Admission medications   Medication Sig Start Date End Date Taking? Authorizing Provider  ALPRAZolam Duanne Moron) 0.5 MG tablet TAKE ONE TABLET BY MOUTH ONCE DAILY AS NEEDED 10/23/21  Duanne Limerick, MD  amLODipine (NORVASC) 10 MG tablet Take 0.5 tablets (5 mg total) by mouth daily. 10/23/21 10/23/22  Duanne Limerick, MD  B Complex-C (SUPER B COMPLEX PO) Take 1 tablet by mouth daily.    [provider]  BIOTIN PO Take 1 capsule by mouth daily.    [provider]  clotrimazole-betamethasone (LOTRISONE) cream Apply 1 application topically 2 (two) times daily. X 1 week 04/10/21   Jerrol Banana, MD  diclofenac (VOLTAREN) 75 MG EC tablet Take 1 tablet (75 mg total) by mouth 2 (two) times daily. 04/10/21   Jerrol Banana, MD  Magnesium 250 MG TABS Take 1 tablet by mouth daily.    [provider]  metoprolol tartrate (LOPRESSOR) 25 MG tablet Take 1/2 (one-half) tablet by mouth twice daily 10/23/21   Duanne Limerick, MD  omeprazole (PRILOSEC) 40 MG capsule TAKE 1 CAPSULE BY MOUTH TWICE DAILY. 10/23/21   Duanne Limerick, MD  simvastatin (ZOCOR) 40 MG tablet Take 1 tablet (40 mg total) by mouth daily. 10/23/21   Duanne Limerick, MD    Family History Family History  Problem Relation Age of Onset   Atrial fibrillation Mother    Hypertension Mother    Diabetes Mother    Atrial fibrillation Father    Breast cancer Maternal Aunt        mat great aunt    Social History Social History   Tobacco Use   Smoking status: Never   Smokeless tobacco: Never  Vaping Use   Vaping Use: Never used  Substance Use Topics   Alcohol use: Not Currently   Drug use: Never     Allergies   Ciprofloxacin, Oxycodone-acetaminophen, Oxycodone-acetaminophen, Sertraline, Vicodin [hydrocodone-acetaminophen], Dilaudid [hydromorphone hcl], and Fenofibrate   Review of Systems Review of Systems  Constitutional:  Negative for diaphoresis.  Eyes:  Positive for visual disturbance.  Respiratory:  Positive for chest tightness and shortness of breath.   Cardiovascular:  Negative for palpitations.  Gastrointestinal:  Negative for nausea.  Neurological:  Positive for dizziness. Negative for headaches.     Physical Exam Triage Vital Signs ED Triage Vitals  Enc Vitals Group     BP      Pulse      Resp      Temp      Temp src      SpO2      Weight      Height      Head Circumference      Peak Flow      Pain Score      Pain Loc      Pain Edu?      Excl. in [place]?    No data found.  Updated Vital Signs BP (!) 143/67 (BP Location: Left Arm)   Pulse 64   Temp 98.6 F (37 C) (Oral)   Resp 16   LMP 04/18/2017 (Approximate)   SpO2 97%   Visual Acuity Right Eye Distance:   Left Eye Distance:   Bilateral  Distance:    Right Eye Near:   Left Eye Near:    Bilateral Near:     Physical Exam Vitals and nursing note reviewed.  Constitutional:      Appearance: Normal appearance.  HENT:     Head: Normocephalic and atraumatic.  Cardiovascular:     Rate and Rhythm: Normal rate and regular rhythm.     Pulses: Normal pulses.     Heart sounds: Normal heart  sounds. No murmur heard.    No friction rub. No gallop.  Pulmonary:     Effort: Pulmonary effort is normal.     Breath sounds: Normal breath sounds. No wheezing, rhonchi or rales.  Skin:    General: Skin is warm and dry.     Capillary Refill: Capillary refill takes less than 2 seconds.  Neurological:     General: No focal deficit present.     Mental Status: She is alert and oriented to person, place, and time.  Psychiatric:        Mood and Affect: Mood normal.        Behavior: Behavior normal.        Thought Content: Thought content normal.        Judgment: Judgment normal.      UC Treatments / Results  Labs (all labs ordered are listed, but only abnormal results are displayed) Labs Reviewed - No data to display  EKG Normal sinus rhythm with a ventricular rate of 67 bpm PR interval 162 ms QRS duration 120 ms QT/QTc 438/460 ms EKG shows a left bundle branch block. This is an interval change when compared to previous EKG from 05/31/2017.   Radiology No results found.  Procedures Procedures (including critical care time)  Medications Ordered in UC Medications  aspirin chewable tablet 324 mg (324 mg Oral Given 11/09/21 0854)    Initial Impression / Assessment and Plan / UC Course  I have reviewed the triage vital signs and the nursing notes.  Pertinent labs & imaging results that were available during my care of the patient were reviewed by me and considered in my medical decision making (see chart for details).   Patient is a nontoxic-appearing 56 year old female here for evaluation of chest tightness, zigzag lights in  her vision, dizziness, shortness of breath, and left arm achiness.  She states that the left arm achiness has resolved but the dizziness, central chest tightness without radiation, and shortness of breath remain.  She did have elevated blood pressure this morning when she took her normal dose of amlodipine.  Hour and a half later she took another half dose of amlodipine for total of 7.5 mg due to her blood pressure being elevated.  Her initial blood pressure measurement at home was 196/99 followed by 166/100.  She states when she took it last it was 146/92.  Patient is currently 143/67.  Due to the patient's visual disturbances and chest tightness over an EKG which shows normal sinus rhythm with a left bundle branch block.  Ventricular rate of 67 bpm.  There is no previous history of left bundle branch block listed in her chart and it is not present on her previous EKG from 05/31/2017.  Patient is currently having 4/10 central chest tightness without radiation.  I will order 324 baby aspirin, IV line placement, and for patient to be transferred to [facility] by EMS.  Report given to Marylene Land, the charge nurse at [facility].  Report given to [facility] paramedic, care transferred.   Final Clinical Impressions(s) / UC Diagnoses   Final diagnoses:  Chest tightness  Dizziness  Shortness of breath  New onset left bundle branch block (LBBB)     Discharge Instructions      Please go to [facility] ER for further evaluation of your chest tightness, dizziness, shortness breath, and EKG changes.     ED Prescriptions   None    PDMP not reviewed this encounter.   Becky Augusta, NP  11/09/21 0859  

## 2021-11-17 ENCOUNTER — Telehealth: Payer: Self-pay | Admitting: Internal Medicine

## 2021-11-17 NOTE — Telephone Encounter (Signed)
Attempted to call patient. Appointmen scheduled should be with Dr. Caryl Comes. Dr. Caryl Comes has opening on 10/5.

## 2021-11-26 ENCOUNTER — Ambulatory Visit: Payer: BC Managed Care – PPO | Attending: Cardiovascular Disease | Admitting: Internal Medicine

## 2021-11-26 ENCOUNTER — Encounter: Payer: Self-pay | Admitting: Internal Medicine

## 2021-11-26 VITALS — BP 130/70 | HR 58 | Ht 71.0 in | Wt 216.2 lb

## 2021-11-26 DIAGNOSIS — I10 Essential (primary) hypertension: Secondary | ICD-10-CM | POA: Diagnosis not present

## 2021-11-26 DIAGNOSIS — R0602 Shortness of breath: Secondary | ICD-10-CM

## 2021-11-26 DIAGNOSIS — E782 Mixed hyperlipidemia: Secondary | ICD-10-CM | POA: Diagnosis not present

## 2021-11-26 DIAGNOSIS — R079 Chest pain, unspecified: Secondary | ICD-10-CM | POA: Diagnosis not present

## 2021-11-26 DIAGNOSIS — R072 Precordial pain: Secondary | ICD-10-CM

## 2021-11-26 DIAGNOSIS — R002 Palpitations: Secondary | ICD-10-CM

## 2021-11-26 NOTE — Progress Notes (Signed)
ELECTROPHYSIOLOGY CONSULT NOTE  Patient ID: Sherri Matthews, MRN: [id number], DOB/AGE: January 15, 1966 56 y.o. Admit date: (Not on file) Date of Consult: 11/26/2021  Primary Physician: Duanne Limerick, MD Primary Cardiologist: prev [facility]     Sherri Matthews is a 56 y.o. female who is being seen today for the evaluation of hypertension at the request of [facility].    HPI Sherri Matthews is a 56 y.o. female referred for palpitations and history of chest pain  Has been seen in the past by [facility] with a negative catheterization.  Seen at [facility] 2021 for uncontrolled hypertension associated with palpitations with abnormal ECG but negative troponins; started on amlodipine with good results.  About 2 weeks ago she awakened at night with visual disturbances blood pressure was 200.  She was seen later that morning in urgent care and transferred to [facility] because of newly identified left bundle branch block (QRSd about 125 ms) serial troponins were negative and she was discharged.  On her own, she increased her amlodipine from 5--10.  Holter monitor demonstrated sinus rhythm (by report with rare PVCs and occasional PACs and 1 nonsustained run of SVT at 14 beats; treated with low-dose metoprolol 25 twice daily  Big complaints are fatigue and dyspnea.  Fatigue has been worse during the school year but this is repeatedly so, moreover, her mother died a year ago.  She has untreated sleep apnea diagnosed for 5 years ago, intolerant to CPAP.  She is currently being fitted for a oral appliance with discussions regarding DATE TEST EF   8/17 Echo   55 %   7/17 LHC    % Normal CAs        Date Cr K Hgb  9/23 0.67 4.0 13.2            Past Medical History:  Diagnosis Date   Anxiety    AV node dysfunction    tachycardia sees Dr. Lady Gary   Cervical disc disease    degenerative disk    Chronic abdominal pain    Heart murmur    Hyperlipidemia    Hypertension    IBS (irritable bowel syndrome)     Mitral valve prolapse    Palpitations    Sleep apnea    possible but not tested      Surgical History:  Past Surgical History:  Procedure Laterality Date   APPENDECTOMY  02/22/2006   BREAST BIOPSY Left 09/22/2021   u/s bx, mass 1:00, RIBBON clip-path pending   BREAST CYST ASPIRATION     CARDIAC CATHETERIZATION N/A 09/12/2015   Procedure: Left Heart Cath and Coronary Angiography;  Surgeon: Marcina Millard, MD;  Location: [facility] INVASIVE CV [facility];  Service: Cardiovascular;  Laterality: N/A;   ESOPHAGOGASTRODUODENOSCOPY (EGD) WITH PROPOFOL N/A 04/18/2015   Procedure: ESOPHAGOGASTRODUODENOSCOPY (EGD) WITH PROPOFOL;  Surgeon: Midge Minium, MD;  Location: [facility];  Service: Endoscopy;  Laterality: N/A;   HERNIA REPAIR       Home Meds: Current Meds  Medication Sig   acetaminophen (TYLENOL) 650 MG CR tablet Take 650 mg by mouth every 8 (eight) hours as needed for pain.   ALPRAZolam (XANAX) 0.5 MG tablet TAKE ONE TABLET BY MOUTH ONCE DAILY AS NEEDED   amLODipine (NORVASC) 10 MG tablet Take 0.5 tablets (5 mg total) by mouth daily. (Patient taking differently: Take 5 mg by mouth in the morning and at bedtime.)   B Complex-C (SUPER B COMPLEX PO) Take 1 tablet by mouth daily.  BIOTIN PO Take 1 capsule by mouth daily.   Magnesium 250 MG TABS Take 1 tablet by mouth daily.   metoprolol tartrate (LOPRESSOR) 25 MG tablet Take 1/2 (one-half) tablet by mouth twice daily   omeprazole (PRILOSEC) 40 MG capsule TAKE 1 CAPSULE BY MOUTH TWICE DAILY.   simvastatin (ZOCOR) 40 MG tablet Take 1 tablet (40 mg total) by mouth daily.    Allergies:  Allergies  Allergen Reactions   Ciprofloxacin Nausea And Vomiting   Oxycodone-Acetaminophen Itching   Oxycodone-Acetaminophen Itching   Sertraline Other (See Comments)    Pt prefers to never be on this again.     Vicodin [Hydrocodone-Acetaminophen] Palpitations   Dilaudid [Hydromorphone Hcl] Other (See Comments)   Fenofibrate Other (See Comments)     Myalgia    Social History   Socioeconomic History   Marital status: Married    Spouse name: Sherri Matthews   Number of children: 3   Years of education: 14+   Highest education level: Associate degree: academic program  Occupational History   Not on file  Tobacco Use   Smoking status: Never   Smokeless tobacco: Never  Vaping Use   Vaping Use: Never used  Substance and Sexual Activity   Alcohol use: Not Currently   Drug use: Never   Sexual activity: Yes    Partners: Male  Other Topics Concern   Not on file  Social History Narrative   Lives at home and independent at baseline   Social Determinants of Health   Financial Resource Strain: Not on file  Food Insecurity: Not on file  Transportation Needs: Not on file  Physical Activity: Not on file  Stress: Not on file  Social Connections: Not on file  Intimate Partner Violence: Not on file     Family History  Problem Relation Age of Onset   Atrial fibrillation Mother    Hypertension Mother    Diabetes Mother    Atrial fibrillation Father    Breast cancer Maternal Aunt        mat great aunt     ROS:  Please see the history of present illness.     All other systems reviewed and negative.    Physical Exam: Body mass index is 30.15 kg/m.  Blood pressure 130/70, pulse (!) 58, height 5\' 11"  (1.803 m), weight 216 lb 3.2 oz (98.1 kg), last menstrual period 04/18/2017, SpO2 97 %. General: Well developed, well nourished female in no acute distress. Head: Normocephalic, atraumatic, sclera non-icteric, no xanthomas, nares are without discharge. EENT: normal  Lymph Nodes:  none Neck: Negative for carotid bruits. JVD not elevated. Back:without scoliosis kyphosis Lungs: Clear bilaterally to auscultation without wheezes, rales, or rhonchi. Breathing is unlabored. Heart: RRR with S1 S2. No  murmur . No rubs, or gallops appreciated. Abdomen: Soft, non-tender, non-distended with normoactive bowel sounds. No hepatomegaly. No  rebound/guarding. No obvious abdominal masses. Msk:  Strength and tone appear normal for age. Extremities: No clubbing or cyanosis. No  edema.  Distal pedal pulses are 2+ and equal bilaterally. Skin: Warm and Dry Neuro: Alert and oriented X 3. CN III-XII intact Grossly normal sensory and motor function . Psych:  Responds to questions appropriately with a normal affect.        EKG: Sinus rhythm at 58 Intervals 16/12/42   Assessment and Plan:  Hypertension-labile  Sleep apnea-untreated  Obesity  Palpitations-PACs and nonsustained atrial tachycardia  Left bundle branch block borderline-new  Dyspnea on exertion  Chest pain-atypical  Hypercholesterolemia-hypertriglyceridemia  The patient has palpitations which have been well controlled with metoprolol.  We will continue the 12.5 mg twice daily and the monitoring strips were reviewed from [facility] demonstrated nonsustained atrial tachycardia  Blood pressures are mostly reasonably controlled and she has tolerated well the recent up titration of her amlodipine from 5--10.  We have discussed the possibility of edema.  We will continue that now, she is taking it twice a day and I told her she could either twice a day or once a day which ever is her preference.  As relates to secondary causes of her blood pressure, I suspect the primary culprit is the untreated sleep apnea; her urinalysis was negative a year ago and her creatinine is normal  Left bundle branch block that prompted referral to the emergency room was a great pickup; QRS duration is right at the threshold for diagnosis.  QRS duration couple years ago was in the 90 ms range.  We will undertake an echocardiogram to assess whether there is a related cardiomyopathy, especially in light of her dyspnea.  I suspect a large part of her dyspnea is related to weight, fatigue and poor conditioning.  However, the LBBB raises other concerns as does her atypical chest pain.  In  regards to the atypical chest pain, will obtain CTA Her 10-year cardiovascular risk is less than 5%.  If her CTA is negativetherapy is indicated    Virl Axe

## 2021-11-26 NOTE — Patient Instructions (Signed)
Medication Instructions:  - Your physician recommends that you continue on your current medications as directed. Please refer to the Current Medication list given to you today.  *If you need a refill on your cardiac medications before your next appointment, please call your pharmacy*   Lab Work: - none ordered  If you have labs (blood work) drawn today and your tests are completely normal, you will receive your results only by: MyChart Message (if you have MyChart) OR A paper copy in the mail If you have any lab test that is abnormal or we need to change your treatment, we will call you to review the results.   Testing/Procedures:  1) Echocardiogram: - Your physician has requested that you have an echocardiogram. Echocardiography is a painless test that uses sound waves to create images of your heart. It provides your doctor with information about the size and shape of your heart and how well your heart's chambers and valves are working. This procedure takes approximately one hour. There are no restrictions for this procedure. There is a possibility that an IV may need to be started during your test to inject an image enhancing agent. This is done to obtain more optimal pictures of your heart. Therefore we ask that you do at least drink some water prior to coming in to hydrate your veins.    2) Cardiac CT angiogram: - Your physician has requested that you have cardiac CT. Cardiac computed tomography (CT) is a painless test that uses an x-ray machine to take clear, detailed pictures of your heart. For further information please visit [email or web address] Please follow instruction sheet as given.    Your cardiac CT will be scheduled at one of the below locations:   [facility] [address] [place], [place] [address] [phone]  OR   [facility] [address] [place], [place] [address] [phone]    If  scheduled at [facility] or [facility], please arrive 15 mins early for check-in and test prep.   Please follow these instructions carefully (unless otherwise directed):  We will administer nitroglycerin during this exam.   On the Night Before the Test: Be sure to Drink plenty of water. Do not consume any caffeinated/decaffeinated beverages or chocolate 12 hours prior to your test. Do not take any antihistamines 12 hours prior to your test.   On the Day of the Test: Drink plenty of water until 1 hour prior to the test. Do not eat any food 1 hour prior to test. You may take your regular medications prior to the test.  Take metoprolol (Lopressor) 25 mg (a whole tablet) two hours prior to test. FEMALES- please wear underwire-free bra if available, avoid dresses & tight clothing        After the Test: Drink plenty of water. After receiving IV contrast, you may experience a mild flushed feeling. This is normal. On occasion, you may experience a mild rash up to 24 hours after the test. This is not dangerous. If this occurs, you can take Benadryl 25 mg and increase your fluid intake. If you experience trouble breathing, this can be serious. If it is severe call 911 IMMEDIATELY. If it is mild, please call our office. If you take any of these medications: Glipizide/Metformin, Avandament, Glucavance, please do not take 48 hours after completing test unless otherwise instructed.   We will call to schedule your test. Some insurance companies will need an  authorization prior to the service being performed.    For non-scheduling related questions, please contact the cardiac imaging nurse navigator should you have any questions/concerns: Rockwell Alexandria, Cardiac Imaging Nurse Navigator Larey Brick, Cardiac Imaging Nurse Navigator [facility] Direct Office Dial: [phone]    For scheduling needs, including  cancellations and rescheduling, please call [place], [phone].   Follow-Up: At [facility], you and your health needs are our priority.  As part of our continuing mission to provide you with exceptional heart care, we have created designated Provider Care Teams.  These Care Teams include your primary Cardiologist (physician) and Advanced Practice Providers (APPs -  Physician Assistants and Nurse Practitioners) who all work together to provide you with the care you need, when you need it.  We recommend signing up for the patient portal called "MyChart".  Sign up information is provided on this After Visit Summary.  MyChart is used to connect with patients for Virtual Visits (Telemedicine).  Patients are able to view lab/test results, encounter notes, upcoming appointments, etc.  Non-urgent messages can be sent to your provider as well.   To learn more about what you can do with MyChart, go to [email or web address].    Your next appointment:   1 year(s)  The format for your next appointment:   In Person  Provider:   Sherryl Manges, MD    Other Instructions  Echocardiogram An echocardiogram is a test that uses sound waves (ultrasound) to produce images of the heart. Images from an echocardiogram can provide important information about: Heart size and shape. The size and thickness and movement of your heart's walls. Heart muscle function and strength. Heart valve function or if you have stenosis. Stenosis is when the heart valves are too narrow. If blood is flowing backward through the heart valves (regurgitation). A tumor or infectious growth around the heart valves. Areas of heart muscle that are not working well because of poor blood flow or injury from a heart attack. Aneurysm detection. An aneurysm is a weak or damaged part of an artery wall. The wall bulges out from the normal force of blood pumping through the body. Tell a health care provider about: Any allergies  you have. All medicines you are taking, including vitamins, herbs, eye drops, creams, and over-the-counter medicines. Any blood disorders you have. Any surgeries you have had. Any medical conditions you have. Whether you are pregnant or may be pregnant. What are the risks? Generally, this is a safe test. However, problems may occur, including an allergic reaction to dye (contrast) that may be used during the test. What happens before the test? No specific preparation is needed. You may eat and drink normally. What happens during the test?  You will take off your clothes from the waist up and put on a hospital gown. Electrodes or electrocardiogram (ECG)patches may be placed on your chest. The electrodes or patches are then connected to a device that monitors your heart rate and rhythm. You will lie down on a table for an ultrasound exam. A gel will be applied to your chest to help sound waves pass through your skin. A handheld device, called a transducer, will be pressed against your chest and moved over your heart. The transducer produces sound waves that travel to your heart and bounce back (or "echo" back) to the transducer. These sound waves will be captured in real-time and changed into images of your heart that can be viewed on a video monitor. The  images will be recorded on a computer and reviewed by your health care provider. You may be asked to change positions or hold your breath for a short time. This makes it easier to get different views or better views of your heart. In some cases, you may receive contrast through an IV in one of your veins. This can improve the quality of the pictures from your heart. The procedure may vary among health care providers and hospitals. What can I expect after the test? You may return to your normal, everyday life, including diet, activities, and medicines, unless your health care provider tells you not to do that. Follow these instructions at  home: It is up to you to get the results of your test. Ask your health care provider, or the department that is doing the test, when your results will be ready. Keep all follow-up visits. This is important. Summary An echocardiogram is a test that uses sound waves (ultrasound) to produce images of the heart. Images from an echocardiogram can provide important information about the size and shape of your heart, heart muscle function, heart valve function, and other possible heart problems. You do not need to do anything to prepare before this test. You may eat and drink normally. After the echocardiogram is completed, you may return to your normal, everyday life, unless your health care provider tells you not to do that. This information is not intended to replace advice given to you by your health care provider. Make sure you discuss any questions you have with your health care provider. Document Revised: 10/22/2020 Document Reviewed: 10/02/2019 Elsevier Patient Education  2023 Elsevier Inc.   Cardiac CT Angiogram A cardiac CT angiogram is a procedure to look at the heart and the area around the heart. It may be done to help find the cause of chest pains or other symptoms of heart disease. During this procedure, a substance called contrast dye is injected into the blood vessels in the area to be checked. A large X-ray machine, called a CT scanner, then takes detailed pictures of the heart and the surrounding area. The procedure is also sometimes called a coronary CT angiogram, coronary artery scanning, or CTA. A cardiac CT angiogram allows the health care provider to see how well blood is flowing to and from the heart. The health care provider will be able to see if there are any problems, such as: Blockage or narrowing of the coronary arteries in the heart. Fluid around the heart. Signs of weakness or disease in the muscles, valves, and tissues of the heart. Tell a health care provider  about: Any allergies you have. This is especially important if you have had a previous allergic reaction to contrast dye. All medicines you are taking, including vitamins, herbs, eye drops, creams, and over-the-counter medicines. Any blood disorders you have. Any surgeries you have had. Any medical conditions you have. Whether you are pregnant or may be pregnant. Any anxiety disorders, chronic pain, or other conditions you have that may increase your stress or prevent you from lying still. What are the risks? Generally, this is a safe procedure. However, problems may occur, including: Bleeding. Infection. Allergic reactions to medicines or dyes. Damage to other structures or organs. Kidney damage from the contrast dye that is used. Increased risk of cancer from radiation exposure. This risk is low. Talk with your health care provider about: The risks and benefits of testing. How you can receive the lowest dose of radiation. What happens before  the procedure? Wear comfortable clothing and remove any jewelry, glasses, dentures, and hearing aids. Follow instructions from your health care provider about eating and drinking. This may include: For 12 hours before the procedure -- avoid caffeine. This includes tea, coffee, soda, energy drinks, and diet pills. Drink plenty of water or other fluids that do not have caffeine in them. Being well hydrated can prevent complications. For 4-6 hours before the procedure -- stop eating and drinking. The contrast dye can cause nausea, but this is less likely if your stomach is empty. Ask your health care provider about changing or stopping your regular medicines. This is especially important if you are taking diabetes medicines, blood thinners, or medicines to treat problems with erections (erectile dysfunction). What happens during the procedure?  Hair on your chest may need to be removed so that small sticky patches called electrodes can be placed on your  chest. These will transmit information that helps to monitor your heart during the procedure. An IV will be inserted into one of your veins. You might be given a medicine to control your heart rate during the procedure. This will help to ensure that good images are obtained. You will be asked to lie on an exam table. This table will slide in and out of the CT machine during the procedure. Contrast dye will be injected into the IV. You might feel warm, or you may get a metallic taste in your mouth. You will be given a medicine called nitroglycerin. This will relax or dilate the arteries in your heart. The table that you are lying on will move into the CT machine tunnel for the scan. The person running the machine will give you instructions while the scans are being done. You may be asked to: Keep your arms above your head. Hold your breath. Stay very still, even if the table is moving. When the scanning is complete, you will be moved out of the machine. The IV will be removed. The procedure may vary among health care providers and hospitals. What can I expect after the procedure? After your procedure, it is common to have: A metallic taste in your mouth from the contrast dye. A feeling of warmth. A headache from the nitroglycerin. Follow these instructions at home: Take over-the-counter and prescription medicines only as told by your health care provider. If you are told, drink enough fluid to keep your urine pale yellow. This will help to flush the contrast dye out of your body. Most people can return to their normal activities right after the procedure. Ask your health care provider what activities are safe for you. It is up to you to get the results of your procedure. Ask your health care provider, or the department that is doing the procedure, when your results will be ready. Keep all follow-up visits as told by your health care provider. This is important. Contact a health care provider  if: You have any symptoms of allergy to the contrast dye. These include: Shortness of breath. Rash or hives. A racing heartbeat. Summary A cardiac CT angiogram is a procedure to look at the heart and the area around the heart. It may be done to help find the cause of chest pains or other symptoms of heart disease. During this procedure, a large X-ray machine, called a CT scanner, takes detailed pictures of the heart and the surrounding area after a contrast dye has been injected into blood vessels in the area. Ask your health care provider about  changing or stopping your regular medicines before the procedure. This is especially important if you are taking diabetes medicines, blood thinners, or medicines to treat erectile dysfunction. If you are told, drink enough fluid to keep your urine pale yellow. This will help to flush the contrast dye out of your body. This information is not intended to replace advice given to you by your health care provider. Make sure you discuss any questions you have with your health care provider. Document Revised: 05/28/2021 Document Reviewed: 10/04/2018 Elsevier Patient Education  2023 Elsevier Inc.   Important Information About Sugar

## 2021-12-09 ENCOUNTER — Telehealth (HOSPITAL_COMMUNITY): Payer: Self-pay | Admitting: Emergency Medicine

## 2021-12-09 NOTE — Telephone Encounter (Signed)
Reaching out to patient to offer assistance regarding upcoming cardiac imaging study; pt verbalizes understanding of appt date/time, parking situation and where to check in, pre-test NPO status and medications ordered, and verified current allergies; name and call back number provided for further questions should they arise Marchia Bond RN Navigator Cardiac Imaging [facility] [phone] office [phone] cell  Arrival 315 [facility] Daily meds Holding caffeine Denies iv issues

## 2021-12-10 ENCOUNTER — Ambulatory Visit
Admission: RE | Admit: 2021-12-10 | Discharge: 2021-12-10 | Disposition: A | Discharge status: Home / Self Care | Payer: BC Managed Care – PPO | Source: Ambulatory Visit | Attending: Internal Medicine | Admitting: Internal Medicine

## 2021-12-10 DIAGNOSIS — R072 Precordial pain: Secondary | ICD-10-CM

## 2021-12-10 MED ORDER — IOHEXOL 300 MG/ML  SOLN: 100.0000 mL | Freq: Once | INTRAMUSCULAR | Status: DC | PRN

## 2021-12-10 MED ORDER — NITROGLYCERIN 0.4 MG SL SUBL
0.8000 mg | SUBLINGUAL_TABLET | Freq: Once | SUBLINGUAL | Status: AC
  Administered 2021-12-10: 0.8 mg via SUBLINGUAL

## 2021-12-10 MED ORDER — IOHEXOL 350 MG/ML SOLN
100.0000 mL | Freq: Once | INTRAVENOUS | Status: AC | PRN
  Administered 2021-12-10: 100 mL via INTRAVENOUS

## 2021-12-10 NOTE — Progress Notes (Signed)
Patient tolerated CT well. Drank water after. Vital signs stable encourage to drink water throughout day.Reasons explained and verbalized understanding. Ambulated steady gait.  

## 2022-01-08 ENCOUNTER — Ambulatory Visit: Payer: BC Managed Care – PPO | Attending: Internal Medicine

## 2022-01-08 DIAGNOSIS — R0602 Shortness of breath: Secondary | ICD-10-CM

## 2022-01-08 LAB — ECHOCARDIOGRAM COMPLETE
AR max vel: 2.39 cm2
AV Area VTI: 2.41 cm2
AV Area mean vel: 2.45 cm2
AV Mean grad: 6 mmHg
AV Peak grad: 10.9 mmHg
Ao pk vel: 1.65 m/s
Area-P 1/2: 5.02 cm2
Calc EF: 54.8 %
S' Lateral: 3.2 cm
Single Plane A2C EF: 52.5 %
Single Plane A4C EF: 57.9 %

## 2022-01-12 ENCOUNTER — Ambulatory Visit: Payer: BC Managed Care – PPO | Admitting: Cardiovascular Disease

## 2022-02-18 ENCOUNTER — Ambulatory Visit: Payer: BC Managed Care – PPO | Admitting: Family Medicine

## 2022-04-21 ENCOUNTER — Other Ambulatory Visit: Payer: Self-pay | Admitting: Family Medicine

## 2022-04-21 DIAGNOSIS — E7849 Other hyperlipidemia: Secondary | ICD-10-CM

## 2022-04-21 NOTE — Telephone Encounter (Signed)
Pt with upcoming appt in 2 days - lab work still within date - 29 day refill provided  Requested Prescriptions  Pending Prescriptions Disp Refills   simvastatin (ZOCOR) 40 MG tablet [Pharmacy Med Name: Simvastatin 40 MG Oral Tablet] 90 tablet 0    Sig: Take 1 tablet by mouth once daily     Cardiovascular:  Antilipid - Statins Failed - 04/21/2022  6:52 AM      Failed - Lipid Panel in normal range within the last 12 months    Cholesterol, Total  Date Value Ref Range Status  04/28/2021 176 100 - 199 mg/dL Final   Cholesterol  Date Value Ref Range Status  04/03/2013 144 0 - 200 mg/dL Final   Ldl Cholesterol, Calc  Date Value Ref Range Status  04/03/2013 67 0 - 100 mg/dL Final   LDL Chol Calc (NIH)  Date Value Ref Range Status  04/28/2021 104 (H) 0 - 99 mg/dL Final   HDL Cholesterol  Date Value Ref Range Status  04/03/2013 28 (L) 40 - 60 mg/dL Final   HDL  Date Value Ref Range Status  04/28/2021 55 >39 mg/dL Final   Triglycerides  Date Value Ref Range Status  04/28/2021 91 0 - 149 mg/dL Final  04/03/2013 246 (H) 0 - 200 mg/dL Final         Passed - Patient is not pregnant      Passed - Valid encounter within last 12 months    Recent Outpatient Visits           6 months ago Essential (primary) hypertension   [facility] [place], Deanna C, MD   11 months ago Essential (primary) hypertension   [facility] [place], Deanna C, MD   1 year ago Cervical spondylosis   [facility] [place], Earley Abide, MD   1 year ago Cervical spondylosis   [facility] [place], Earley Abide, MD   1 year ago Essential (primary) hypertension   [facility] [place], Batt[place], MD       Future Appointments             In 2 days Juline Patch, MD [place] [facility], [facility]

## 2022-04-23 ENCOUNTER — Encounter: Payer: Self-pay | Admitting: Family Medicine

## 2022-04-23 ENCOUNTER — Ambulatory Visit: Payer: BC Managed Care – PPO | Admitting: Family Medicine

## 2022-04-23 VITALS — BP 124/78 | HR 55 | Ht 71.0 in | Wt 219.0 lb

## 2022-04-23 DIAGNOSIS — F064 Anxiety disorder due to known physiological condition: Secondary | ICD-10-CM | POA: Diagnosis not present

## 2022-04-23 DIAGNOSIS — K219 Gastro-esophageal reflux disease without esophagitis: Secondary | ICD-10-CM

## 2022-04-23 DIAGNOSIS — E7849 Other hyperlipidemia: Secondary | ICD-10-CM | POA: Diagnosis not present

## 2022-04-23 DIAGNOSIS — R21 Rash and other nonspecific skin eruption: Secondary | ICD-10-CM

## 2022-04-23 DIAGNOSIS — I10 Essential (primary) hypertension: Secondary | ICD-10-CM

## 2022-04-23 MED ORDER — AMLODIPINE BESYLATE 10 MG PO TABS: 5.0000 mg | ORAL_TABLET | Freq: Two times a day (BID) | ORAL | 1 refills | Status: DC

## 2022-04-23 MED ORDER — OMEPRAZOLE 40 MG PO CPDR: DELAYED_RELEASE_CAPSULE | ORAL | 1 refills | Status: DC

## 2022-04-23 MED ORDER — SIMVASTATIN 40 MG PO TABS: 40.0000 mg | ORAL_TABLET | Freq: Every day | ORAL | 1 refills | Status: DC

## 2022-04-23 MED ORDER — METOPROLOL TARTRATE 25 MG PO TABS: ORAL_TABLET | ORAL | 1 refills | Status: DC

## 2022-04-23 MED ORDER — ALPRAZOLAM 0.5 MG PO TABS: ORAL_TABLET | ORAL | 2 refills | Status: DC

## 2022-04-23 NOTE — Patient Instructions (Signed)

## 2022-04-23 NOTE — Progress Notes (Signed)
Date:  04/23/2022   Name:  Sherri Matthews   DOB:  21-Mar-1965   MRN:  [id number]   Chief Complaint: Hypertension, Hyperlipidemia, Gastroesophageal Reflux, and Anxiety  Hypertension This is a chronic problem. Associated symptoms include anxiety. Pertinent negatives include no chest pain, headaches, palpitations or shortness of breath. There are no compliance problems.  There is no history of angina, CAD/MI or CVA. There is no history of chronic renal disease.  Hyperlipidemia This is a chronic problem. The current episode started more than 1 year ago. The problem is controlled. Recent lipid tests were reviewed and are normal. She has no history of chronic renal disease. Pertinent negatives include no chest pain, myalgias or shortness of breath. Current antihyperlipidemic treatment includes statins. The current treatment provides moderate improvement of lipids.  Gastroesophageal Reflux She reports no abdominal pain, no chest pain, no coughing, no nausea, no sore throat or no wheezing. This is a chronic problem. The problem has been gradually improving. The symptoms are aggravated by certain foods. Pertinent negatives include no fatigue. She has tried a PPI for the symptoms.  Anxiety Presents for follow-up visit. Symptoms include nervous/anxious behavior and panic. Patient reports no chest pain, decreased concentration, dizziness, excessive worry, irritability, nausea, palpitations or shortness of breath.    Rash This is a new problem. Progression since onset: episodic x 3. The affected locations include the left lower leg and right lower leg. Rash characteristics: "blood blisters" Pertinent negatives include no congestion, cough, diarrhea, fatigue, fever, rhinorrhea, shortness of breath or sore throat.    Lab Results  Component Value Date   NA 143 11/09/2021   K 4.0 11/09/2021   CO2 25 11/09/2021   GLUCOSE 112 (H) 11/09/2021   BUN 10 11/09/2021   CREATININE 0.67 11/09/2021   CALCIUM  9.4 11/09/2021   EGFR 101 04/28/2021   GFRNONAA >60 11/09/2021   Lab Results  Component Value Date   CHOL 176 04/28/2021   HDL 55 04/28/2021   LDLCALC 104 (H) 04/28/2021   TRIG 91 04/28/2021   CHOLHDL 4.6 (H) 02/24/2018   Lab Results  Component Value Date   TSH 0.910 11/01/2017   No results found for: "HGBA1C" Lab Results  Component Value Date   WBC 6.5 11/09/2021   HGB 13.2 11/09/2021   HCT 40.6 11/09/2021   MCV 86.8 11/09/2021   PLT 256 11/09/2021   Lab Results  Component Value Date   ALT 20 04/28/2021   AST 22 04/28/2021   ALKPHOS 101 04/28/2021   BILITOT 0.5 04/28/2021   No results found for: "25OHVITD2", "25OHVITD3", "VD25OH"   Review of Systems  Constitutional: Negative.  Negative for chills, fatigue, fever, irritability and unexpected weight change.  HENT:  Negative for congestion, ear discharge, ear pain, rhinorrhea, sinus pressure, sneezing, sore throat and trouble swallowing.   Respiratory:  Negative for cough, shortness of breath, wheezing and stridor.   Cardiovascular:  Negative for chest pain, palpitations and leg swelling.  Gastrointestinal:  Negative for abdominal pain, blood in stool, constipation, diarrhea and nausea.  Genitourinary:  Negative for dysuria, flank pain, frequency, hematuria, urgency and vaginal discharge.  Musculoskeletal:  Negative for arthralgias, back pain and myalgias.  Skin:  Positive for rash.  Neurological:  Negative for dizziness, weakness and headaches.  Hematological:  Negative for adenopathy. Does not bruise/bleed easily.  Psychiatric/Behavioral:  Negative for decreased concentration and dysphoric mood. The patient is nervous/anxious.     Patient Active Problem List   Diagnosis Date Noted  Tinea corporis 04/10/2021   Cervical spondylosis 11/20/2020   Segmental and somatic dysfunction of cervical region 11/20/2020   Obstructive sleep apnea 12/25/2019   Intractable migraine with aura without status migrainosus 06/21/2016    Chest pain 06/19/2016   Abnormal EKG 06/19/2016   Menopause 01/28/2016   Heart palpitations 10/09/2015   SOB (shortness of breath) 09/24/2015   Diverticulitis 06/04/2015   Lower back pain 06/04/2015   SVT (supraventricular tachycardia) 06/04/2015   Essential hypertension 05/15/2015   Hyperlipidemia 05/15/2015   Gastritis    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     Vicodin [Hydrocodone-Acetaminophen] Palpitations   Dilaudid [Hydromorphone Hcl] Other (See Comments)   Fenofibrate Other (See Comments)    Myalgia    Past Surgical History:  Procedure Laterality Date   APPENDECTOMY  02/22/2006   BREAST BIOPSY Left 09/22/2021   u/s bx, mass 1:00, RIBBON clip-path pending   BREAST CYST ASPIRATION     CARDIAC CATHETERIZATION N/A 09/12/2015   Procedure: Left Heart Cath and Coronary Angiography;  Surgeon: Isaias Cowman, MD;  Location: [facility];  Service: Cardiovascular;  Laterality: N/A;   ESOPHAGOGASTRODUODENOSCOPY (EGD) WITH PROPOFOL N/A 04/18/2015   Procedure: ESOPHAGOGASTRODUODENOSCOPY (EGD) WITH PROPOFOL;  Surgeon: Lucilla Lame, MD;  Location: [facility];  Service: Endoscopy;  Laterality: N/A;   HERNIA REPAIR      Social History   Tobacco Use   Smoking status: Never   Smokeless tobacco: Never  Vaping Use   Vaping Use: Never used  Substance Use Topics   Alcohol use: Not Currently   Drug use: Never     Medication list has been reviewed and updated.  Current Meds  Medication Sig    ALPRAZolam (XANAX) 0.5 MG tablet TAKE ONE TABLET BY MOUTH ONCE DAILY AS NEEDED   amLODipine (NORVASC) 10 MG tablet Take 0.5 tablets (5 mg total) by mouth daily. (Patient taking differently: Take 5 mg by mouth in the morning and at bedtime.)   B Complex-C (SUPER B COMPLEX PO) Take 1 tablet by mouth daily.   BIOTIN PO Take 1 capsule by mouth daily.   Magnesium 250 MG TABS Take 1 tablet by mouth daily.   metoprolol tartrate (LOPRESSOR) 25 MG tablet Take 1/2 (one-half) tablet by mouth twice daily   omeprazole (PRILOSEC) 40 MG capsule TAKE 1 CAPSULE BY MOUTH TWICE DAILY.   simvastatin (ZOCOR) 40 MG tablet Take 1 tablet by mouth once daily       04/23/2022   10:08 AM 10/23/2021    9:34 AM 04/28/2021    8:06 AM 04/10/2021    3:53 PM  GAD 7 : Generalized Anxiety Score  Nervous, Anxious, on Edge 0 0 0 0  Control/stop worrying 0 0 0 0  Worry too much - different things 0 0 0 0  Trouble relaxing 0 0 0 0  Restless 0 0 0 0  Easily annoyed or irritable 0 0 0 0  Afraid - awful might happen 0 0 0 0  Total GAD 7 Score 0 0 0 0  Anxiety Difficulty Not difficult at all Not difficult  at all Not difficult at all        04/23/2022   10:08 AM 10/23/2021    9:34 AM 04/28/2021    8:06 AM  Depression screen PHQ 2/9  Decreased Interest 0 0 0  Down, Depressed, Hopeless 0 0 0  PHQ - 2 Score 0 0 0  Altered sleeping 0 0 0  Tired, decreased energy 0 0 0  Change in appetite 0 0 0  Feeling bad or failure about yourself  0 0 0  Trouble concentrating 0 0 0  Moving slowly or fidgety/restless 0 0 0  Suicidal thoughts 0 0 0  PHQ-9 Score 0 0 0  Difficult doing work/chores Not difficult at all Not difficult at all Not difficult at all    BP Readings from Last 3 Encounters:  04/23/22 124/78  12/10/21 130/71  11/26/21 130/70    Physical Exam Vitals and nursing note reviewed. Exam conducted with a chaperone present.  Constitutional:      General: She is not in acute distress.    Appearance: She is not diaphoretic.   HENT:     Head: Normocephalic and atraumatic.     Right Ear: External ear normal.     Left Ear: External ear normal.     Nose: Nose normal.  Eyes:     General:        Right eye: No discharge.        Left eye: No discharge.     Conjunctiva/sclera: Conjunctivae normal.     Pupils: Pupils are equal, round, and reactive to light.  Cardiovascular:     Rate and Rhythm: Normal rate and regular rhythm.     Heart sounds: Normal heart sounds, S1 normal and S2 normal. No murmur heard.    No systolic murmur is present.     No diastolic murmur is present.     No friction rub. No gallop. No S3 or S4 sounds.  Pulmonary:     Effort: Pulmonary effort is normal.     Breath sounds: Normal breath sounds.  Abdominal:     General: Bowel sounds are normal.     Palpations: Abdomen is soft. There is no mass.     Tenderness: There is no abdominal tenderness. There is no guarding.  Musculoskeletal:        General: Normal range of motion.     Cervical back: Neck supple.  Lymphadenopathy:     Cervical: No cervical adenopathy.  Skin:    General: Skin is warm and dry.  Neurological:     Mental Status: She is alert.     Wt Readings from Last 3 Encounters:  04/23/22 219 lb (99.3 kg)  11/26/21 216 lb 3.2 oz (98.1 kg)  11/09/21 220 lb 7.4 oz (100 kg)    BP 124/78   Pulse (!) 55   Ht '5\' 11"'$  (1.803 m)   Wt 219 lb (99.3 kg)   LMP 04/18/2017 (Approximate)   SpO2 96%   BMI 30.54 kg/m   Assessment and Plan:  1. Essential (primary) hypertension Chronic.  Controlled.  Stable.  Blood pressure 124/78.  Asymptomatic.  Tolerating medications well.  Continue metoprolol 25 mg 1/2 tablet twice a day and amlodipine 5 mg twice a day. - metoprolol tartrate (LOPRESSOR) 25 MG tablet; Take 1/2 (one-half) tablet by mouth twice daily  Dispense: 90 tablet; Refill: 1 - Comprehensive Metabolic Panel (CMET) - ALPRAZolam (XANAX) 0.5 MG tablet; TAKE ONE TABLET BY MOUTH ONCE DAILY AS NEEDED  Dispense: 30 tablet;  Refill:  2  2. Gastroesophageal reflux disease Chronic.  Controlled.  Stable.  Stable on current dosing of omeprazole 40 mg twice a day. - omeprazole (PRILOSEC) 40 MG capsule; TAKE 1 CAPSULE BY MOUTH TWICE DAILY.  Dispense: 180 capsule; Refill: 1  3. 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  Stable.  Continue simvastatin 40 mg once a day.  Will check lipid panel for stability. - simvastatin (ZOCOR) 40 MG tablet; Take 1 tablet (40 mg total) by mouth daily.  Dispense: 90 tablet; Refill: 1 - Lipid Panel With LDL/HDL Ratio  4. Anxiety disorder due to known physiological condition .  Episodic.  Occasional panic attacks necessitating alprazolam 0.51 tablet as needed patient takes it on a rare basis and usually 30 tablets will last for 6 months. - ALPRAZolam (XANAX) 0.5 MG tablet; TAKE ONE TABLET BY MOUTH ONCE DAILY AS NEEDED  Dispense: 30 tablet; Refill: 2  5. Rash New onset.  Recurrent.  Episodic that comes for about 4 to 5 days and resolves on its own described as "blood blisters ".  This may be petechial in nature will obtain a CBC and patient will come when present for evaluation. - CBC with Differential/Platelet    Otilio Miu, MD

## 2022-04-24 LAB — COMPREHENSIVE METABOLIC PANEL
ALT: 18 IU/L (ref 0–32)
AST: 23 IU/L (ref 0–40)
Albumin/Globulin Ratio: 2.1 (ref 1.2–2.2)
Albumin: 4.7 g/dL (ref 3.8–4.9)
Alkaline Phosphatase: 118 IU/L (ref 44–121)
BUN/Creatinine Ratio: 15 (ref 9–23)
BUN: 11 mg/dL (ref 6–24)
Bilirubin Total: 0.7 mg/dL (ref 0.0–1.2)
CO2: 21 mmol/L (ref 20–29)
Calcium: 9.3 mg/dL (ref 8.7–10.2)
Chloride: 105 mmol/L (ref 96–106)
Creatinine, Ser: 0.74 mg/dL (ref 0.57–1.00)
Globulin, Total: 2.2 g/dL (ref 1.5–4.5)
Glucose: 86 mg/dL (ref 70–99)
Potassium: 4.3 mmol/L (ref 3.5–5.2)
Sodium: 142 mmol/L (ref 134–144)
Total Protein: 6.9 g/dL (ref 6.0–8.5)
eGFR: 94 mL/min/{1.73_m2} (ref >59)

## 2022-04-24 LAB — CBC WITH DIFFERENTIAL/PLATELET
Basophils Absolute: 0 10*3/uL (ref 0.0–0.2)
Basos: 1 %
EOS (ABSOLUTE): 0.1 10*3/uL (ref 0.0–0.4)
Eos: 2 %
Hematocrit: 36.8 % (ref 34.0–46.6)
Hemoglobin: 12.1 g/dL (ref 11.1–15.9)
Immature Grans (Abs): 0 10*3/uL (ref 0.0–0.1)
Immature Granulocytes: 0 %
Lymphocytes Absolute: 1.6 10*3/uL (ref 0.7–3.1)
Lymphs: 28 %
MCH: 27.1 pg (ref 26.6–33.0)
MCHC: 32.9 g/dL (ref 31.5–35.7)
MCV: 82 fL (ref 79–97)
Monocytes Absolute: 0.5 10*3/uL (ref 0.1–0.9)
Monocytes: 9 %
Neutrophils Absolute: 3.5 10*3/uL (ref 1.4–7.0)
Neutrophils: 60 %
Platelets: 270 10*3/uL (ref 150–450)
RBC: 4.47 x10E6/uL (ref 3.77–5.28)
RDW: 13.3 % (ref 11.7–15.4)
WBC: 5.7 10*3/uL (ref 3.4–10.8)

## 2022-04-24 LAB — LIPID PANEL WITH LDL/HDL RATIO
Cholesterol, Total: 190 mg/dL (ref 100–199)
HDL: 56 mg/dL (ref >39)
LDL Chol Calc (NIH): 115 mg/dL — ABNORMAL HIGH (ref 0–99)
LDL/HDL Ratio: 2.1 ratio (ref 0.0–3.2)
Triglycerides: 105 mg/dL (ref 0–149)
VLDL Cholesterol Cal: 19 mg/dL (ref 5–40)

## 2022-05-09 ENCOUNTER — Ambulatory Visit
Admission: EM | Admit: 2022-05-09 | Discharge: 2022-05-09 | Disposition: A | Discharge status: Home / Self Care | Payer: BC Managed Care – PPO | Attending: Emergency Medicine | Admitting: Emergency Medicine

## 2022-05-09 ENCOUNTER — Encounter: Payer: Self-pay | Admitting: Emergency Medicine

## 2022-05-09 DIAGNOSIS — J020 Streptococcal pharyngitis: Secondary | ICD-10-CM | POA: Diagnosis not present

## 2022-05-09 LAB — GROUP A STREP BY PCR: Group A Strep by PCR: DETECTED — AB

## 2022-05-09 MED ORDER — AMOXICILLIN 500 MG PO CAPS: 500.0000 mg | ORAL_CAPSULE | Freq: Two times a day (BID) | ORAL | 0 refills | Status: AC

## 2022-05-09 NOTE — Discharge Instructions (Addendum)
Your strep test today was positive  Take amoxicillin twice a day for the next 10 days, daily will see improvement in about 48 hours and steady progression from there  To be use of salt gargles throat lozenges, warm liquids, teaspoons of honey and over-the-counter clippers septic spray for comfort  May give Tylenol or Motrin every 6 hours as needed for additional comfort  You may follow-up at urgent care as needed

## 2022-05-09 NOTE — ED Triage Notes (Signed)
Patient c/o sore throat that started last night.  Patient denies fevers.  Patient states that her right tonsil is more swollen and painful.

## 2022-05-09 NOTE — ED Provider Notes (Addendum)
[facility]    CSN: [id number] Arrival date & time: 05/09/22  [id number]      History   Chief Complaint Chief Complaint  Patient presents with   Sore Throat    HPI Sherri Matthews is a 57 y.o. female.   Patient presents for evaluation of sore throat and swollen tonsils beginning 1 day ago.  2 days ago was experiencing increased fatigue.  Noticed Sherri Matthews patches on the right tonsil with increased swelling this morning.  Overnight experiencing intermittent mild ear pain without drainage, pruritus or decreased hearing.  Has been able to tolerate food and liquids.  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  Has not attempted treatment.  Denies fevers.    Past Medical History:  Diagnosis Date   Anxiety    AV node dysfunction    tachycardia sees Dr. Ubaldo Glassing   Cervical disc disease    degenerative disk    Chronic abdominal pain    Heart murmur    Hyperlipidemia    Hypertension    IBS (irritable bowel syndrome)    Mitral valve prolapse    Palpitations    Sleep apnea    possible but not tested    Patient Active Problem List   Diagnosis Date Noted   Tinea corporis 04/10/2021   Cervical spondylosis 11/20/2020   Segmental and somatic dysfunction of cervical region 11/20/2020   Obstructive sleep apnea 12/25/2019   Intractable migraine with aura without status migrainosus 06/21/2016   Chest pain 06/19/2016   Abnormal EKG 06/19/2016   Menopause 01/28/2016   Heart palpitations 10/09/2015   SOB (shortness of breath) 09/24/2015   Diverticulitis 06/04/2015   Lower back pain 06/04/2015   SVT (supraventricular tachycardia) 06/04/2015   Essential hypertension 05/15/2015   Hyperlipidemia 05/15/2015   Gastritis    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   Anxiety disorder due to known physiological condition 07/16/2014   DDD (degenerative disc disease), lumbosacral 07/16/2014   Heart murmur 07/16/2014   Gastroesophageal reflux disease  07/16/2014   H/O: osteoarthritis 07/16/2014    Past Surgical History:  Procedure Laterality Date   APPENDECTOMY  02/22/2006   BREAST BIOPSY Left 09/22/2021   u/s bx, mass 1:00, RIBBON clip-path pending   BREAST CYST ASPIRATION     CARDIAC CATHETERIZATION N/A 09/12/2015   Procedure: Left Heart Cath and Coronary Angiography;  Surgeon: Isaias Cowman, MD;  Location: [facility];  Service: Cardiovascular;  Laterality: N/A;   ESOPHAGOGASTRODUODENOSCOPY (EGD) WITH PROPOFOL N/A 04/18/2015   Procedure: ESOPHAGOGASTRODUODENOSCOPY (EGD) WITH PROPOFOL;  Surgeon: Lucilla Lame, MD;  Location: [facility];  Service: Endoscopy;  Laterality: N/A;   HERNIA REPAIR      OB History   No obstetric history on file.      Home Medications    Prior to Admission medications   Medication Sig Start Date End Date Taking? Authorizing Provider  acetaminophen (TYLENOL) 650 MG CR tablet Take 650 mg by mouth every 8 (eight) hours as needed for pain. Patient not taking: Reported on 04/23/2022    [provider]  ALPRAZolam Duanne Moron) 0.5 MG tablet TAKE ONE TABLET BY MOUTH ONCE DAILY AS NEEDED 04/23/22   Juline Patch, MD  amLODipine (NORVASC) 10 MG tablet Take 0.5 tablets (5 mg total) by mouth in the morning and at bedtime. 04/23/22 04/23/23  Juline Patch, MD  B Complex-C (SUPER B COMPLEX PO) Take 1 tablet by mouth daily.    [provider]  BIOTIN PO Take 1  capsule by mouth daily.    [provider]  Magnesium 250 MG TABS Take 1 tablet by mouth daily.    [provider]  metoprolol tartrate (LOPRESSOR) 25 MG tablet Take 1/2 (one-half) tablet by mouth twice daily 04/23/22   Juline Patch, MD  omeprazole (PRILOSEC) 40 MG capsule TAKE 1 CAPSULE BY MOUTH TWICE DAILY. 04/23/22   Juline Patch, MD  simvastatin (ZOCOR) 40 MG tablet Take 1 tablet (40 mg total) by mouth daily. 04/23/22   Juline Patch, MD    Family History Family History  Problem Relation Age of Onset    Atrial fibrillation Mother    Hypertension Mother    Diabetes Mother    Atrial fibrillation Father    Breast cancer Maternal Aunt        mat great aunt    Social History Social History   Tobacco Use   Smoking status: Never   Smokeless tobacco: Never  Vaping Use   Vaping Use: Never used  Substance Use Topics   Alcohol use: Not Currently   Drug use: Never     Allergies   Ciprofloxacin, Oxycodone-acetaminophen, Oxycodone-acetaminophen, Sertraline, Vicodin [hydrocodone-acetaminophen], Dilaudid [hydromorphone hcl], and Fenofibrate   Review of Systems Review of Systems Defer to HPI    Physical Exam Triage Vital Signs ED Triage Vitals  Enc Vitals Group     BP 05/09/22 0952 134/66     Pulse Rate 05/09/22 0952 76     Resp 05/09/22 0952 14     Temp 05/09/22 0952 98.5 F (36.9 C)     Temp Source 05/09/22 0952 Oral     SpO2 05/09/22 0952 97 %     Weight 05/09/22 0950 218 lb 14.7 oz (99.3 kg)     Height 05/09/22 0950 5\' 11"  (1.803 m)     Head Circumference --      Peak Flow --      Pain Score 05/09/22 0950 4     Pain Loc --      Pain Edu? --      Excl. in [place]? --    No data found.  Updated Vital Signs BP 134/66 (BP Location: Left Arm)   Pulse 76   Temp 98.5 F (36.9 C) (Oral)   Resp 14   Ht 5\' 11"  (1.803 m)   Wt 218 lb 14.7 oz (99.3 kg)   LMP 04/18/2017 (Approximate)   SpO2 97%   BMI 30.53 kg/m   Visual Acuity Right Eye Distance:   Left Eye Distance:   Bilateral Distance:    Right Eye Near:   Left Eye Near:    Bilateral Near:     Physical Exam Constitutional:      Appearance: She is well-developed.  HENT:     Head: Normocephalic.     Right Ear: Tympanic membrane and ear canal normal.     Left Ear: Tympanic membrane and ear canal normal.     Nose: No congestion or rhinorrhea.     Mouth/Throat:     Mouth: Mucous membranes are moist.     Pharynx: Posterior oropharyngeal erythema present.     Tonsils: Tonsillar exudate present. 3+ on the right.  2+ on the left.  Cardiovascular:     Rate and Rhythm: Normal rate and regular rhythm.     Heart sounds: Normal heart sounds.  Pulmonary:     Effort: Pulmonary effort is normal.  Musculoskeletal:     Cervical back: Normal range of motion and neck supple.  Lymphadenopathy:     Cervical: Cervical adenopathy present.  Skin:    General: Skin is warm and dry.  Neurological:     General: No focal deficit present.     Mental Status: She is alert and oriented to person, place, and time.  Psychiatric:        Mood and Affect: Mood normal.        Behavior: Behavior normal.      UC Treatments / Results  Labs (all labs ordered are listed, but only abnormal results are displayed) Labs Reviewed  GROUP A STREP BY PCR    EKG   Radiology No results found.  Procedures Procedures (including critical care time)  Medications Ordered in UC Medications - No data to display  Initial Impression / Assessment and Plan / UC Course  I have reviewed the triage vital signs and the nursing notes.  Pertinent labs & imaging results that were available during my care of the patient were reviewed by me and considered in my medical decision making (see chart for details).   Strep pharyngitis  Confirmed by PCR, discussed findings with patient, amoxicillin prescribed recommended additional supportive measures and advised follow-up if symptoms persist or worsen Final Clinical Impressions(s) / UC Diagnoses   Final diagnoses:  None   Discharge Instructions   None    ED Prescriptions   None    PDMP not reviewed this encounter.   Hans Eden, NP 05/09/22 1002    Lowella Petties R, [place] 05/09/22 1022

## 2022-06-03 ENCOUNTER — Ambulatory Visit (INDEPENDENT_AMBULATORY_CARE_PROVIDER_SITE_OTHER): Payer: BC Managed Care – PPO

## 2022-06-03 ENCOUNTER — Ambulatory Visit
Admission: RE | Admit: 2022-06-03 | Discharge: 2022-06-03 | Disposition: A | Discharge status: Home / Self Care | Payer: BC Managed Care – PPO | Source: Ambulatory Visit | Attending: Family Medicine | Admitting: Family Medicine

## 2022-06-03 VITALS — BP 126/72 | HR 62 | Temp 98.1°F | Resp 16

## 2022-06-03 DIAGNOSIS — R0989 Other specified symptoms and signs involving the circulatory and respiratory systems: Secondary | ICD-10-CM

## 2022-06-03 DIAGNOSIS — J069 Acute upper respiratory infection, unspecified: Secondary | ICD-10-CM

## 2022-06-03 DIAGNOSIS — R059 Cough, unspecified: Secondary | ICD-10-CM | POA: Diagnosis not present

## 2022-06-03 MED ORDER — PREDNISONE 10 MG (21) PO TBPK: ORAL_TABLET | Freq: Every day | ORAL | 0 refills | Status: DC

## 2022-06-03 MED ORDER — PROMETHAZINE-DM 6.25-15 MG/5ML PO SYRP: 5.0000 mL | ORAL_SOLUTION | Freq: Four times a day (QID) | ORAL | 0 refills | Status: DC | PRN

## 2022-06-03 NOTE — ED Provider Notes (Signed)
[facility]    CSN: [id number] Arrival date & time: 06/03/22  1344      History   Chief Complaint Chief Complaint  Patient presents with   Cough    Allergy symptoms but abdominal , rib and back pain - Entered by patient    HPI Sherri Matthews is a 57 y.o. female.   HPI   Sherri Matthews presents for cough that started on Friday.  She felt a tickle in her throat. Has crackles and deep wheezing at night during coughing fits. Has lower rib pain, abdominal pain and upper back pain. Home COVID test is negative.  She is a Midwifekindergarten teacher.   Fever : no Chills: no Sore throat: slight Cough: yes  Sputum: no Nasal congestion: yes Rhinorrhea: yes Myalgias: yes Appetite: normal  Hydration: normal  Abdominal pain: no Nausea: no Vomiting: no Diarrhea: No Rash: No Sleep disturbance: yes Headache: yes     Past Medical History:  Diagnosis Date   Anxiety    AV node dysfunction    tachycardia sees Dr. Lady GaryFath   Cervical disc disease    degenerative disk    Chronic abdominal pain    Heart murmur    Hyperlipidemia    Hypertension    IBS (irritable bowel syndrome)    Mitral valve prolapse    Palpitations    Sleep apnea    possible but not tested    Patient Active Problem List   Diagnosis Date Noted   Tinea corporis 04/10/2021   Cervical spondylosis 11/20/2020   Segmental and somatic dysfunction of cervical region 11/20/2020   Obstructive sleep apnea 12/25/2019   Intractable migraine with aura without status migrainosus 06/21/2016   Chest pain 06/19/2016   Abnormal EKG 06/19/2016   Menopause 01/28/2016   Heart palpitations 10/09/2015   SOB (shortness of breath) 09/24/2015   Diverticulitis 06/04/2015   Lower back pain 06/04/2015   SVT (supraventricular tachycardia) 06/04/2015   Essential hypertension 05/15/2015   Hyperlipidemia 05/15/2015   Gastritis    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   Anxiety disorder due to known  physiological condition 07/16/2014   DDD (degenerative disc disease), lumbosacral 07/16/2014   Heart murmur 07/16/2014   Gastroesophageal reflux disease 07/16/2014   H/O: osteoarthritis 07/16/2014    Past Surgical History:  Procedure Laterality Date   APPENDECTOMY  02/22/2006   BREAST BIOPSY Left 09/22/2021   u/s bx, mass 1:00, RIBBON clip-path pending   BREAST CYST ASPIRATION     CARDIAC CATHETERIZATION N/A 09/12/2015   Procedure: Left Heart Cath and Coronary Angiography;  Surgeon: Marcina MillardAlexander Paraschos, MD;  Location: [facility];  Service: Cardiovascular;  Laterality: N/A;   ESOPHAGOGASTRODUODENOSCOPY (EGD) WITH PROPOFOL N/A 04/18/2015   Procedure: ESOPHAGOGASTRODUODENOSCOPY (EGD) WITH PROPOFOL;  Surgeon: Midge Miniumarren Wohl, MD;  [facility];  Service: Endoscopy;  Laterality: N/A;   HERNIA REPAIR      OB History   No obstetric history on file.      Home Medications    Prior to Admission medications   Medication Sig Start Date End Date Taking? Authorizing Provider  predniSONE (STERAPRED UNI-PAK 21 TAB) 10 MG (21) TBPK tablet Take by mouth daily. Take 6 tabs by mouth daily for 1, then 5 tabs for 1 day, then 4 tabs for 1 day, then 3 tabs for 1 day, then 2 tabs for 1 day, then 1 tab for 1 day. 06/03/22  Yes Andrika Peraza, Seward MethVondra, DO  acetaminophen (TYLENOL) 650 MG CR tablet Take 650 mg by  mouth every 8 (eight) hours as needed for pain. Patient not taking: Reported on 04/23/2022    [provider]  ALPRAZolam Prudy Feeler) 0.5 MG tablet TAKE ONE TABLET BY MOUTH ONCE DAILY AS NEEDED 04/23/22   Duanne Limerick, MD  amLODipine (NORVASC) 10 MG tablet Take 0.5 tablets (5 mg total) by mouth in the morning and at bedtime. 04/23/22 04/23/23  Duanne Limerick, MD  B Complex-C (SUPER B COMPLEX PO) Take 1 tablet by mouth daily.    [provider]  BIOTIN PO Take 1 capsule by mouth daily.    [provider]  Magnesium 250 MG TABS Take 1 tablet by mouth daily.     [provider]  metoprolol tartrate (LOPRESSOR) 25 MG tablet Take 1/2 (one-half) tablet by mouth twice daily 04/23/22   Duanne Limerick, MD  omeprazole (PRILOSEC) 40 MG capsule TAKE 1 CAPSULE BY MOUTH TWICE DAILY. 04/23/22   Duanne Limerick, MD  simvastatin (ZOCOR) 40 MG tablet Take 1 tablet (40 mg total) by mouth daily. 04/23/22   Duanne Limerick, MD    Family History Family History  Problem Relation Age of Onset   Atrial fibrillation Mother    Hypertension Mother    Diabetes Mother    Atrial fibrillation Father    Breast cancer Maternal Aunt        mat great aunt    Social History Social History   Tobacco Use   Smoking status: Never   Smokeless tobacco: Never  Vaping Use   Vaping Use: Never used  Substance Use Topics   Alcohol use: Not Currently   Drug use: Never     Allergies   Ciprofloxacin, Oxycodone-acetaminophen, Oxycodone-acetaminophen, Sertraline, Vicodin [hydrocodone-acetaminophen], Dilaudid [hydromorphone hcl], and Fenofibrate   Review of Systems Review of Systems: negative unless otherwise stated in HPI.      Physical Exam Triage Vital Signs ED Triage Vitals  Enc Vitals Group     BP 06/03/22 1401 126/72     Pulse Rate 06/03/22 1401 62     Resp 06/03/22 1401 16     Temp 06/03/22 1401 98.1 F (36.7 C)     Temp Source 06/03/22 1401 Oral     SpO2 06/03/22 1401 97 %     Weight --      Height --      Head Circumference --      Peak Flow --      Pain Score 06/03/22 1400 3     Pain Loc --      Pain Edu? --      Excl. in GC? --    No data found.  Updated Vital Signs BP 126/72 (BP Location: Left Arm)   Pulse 62   Temp 98.1 F (36.7 C) (Oral)   Resp 16   LMP 04/18/2017 (Approximate)   SpO2 97%   Visual Acuity Right Eye Distance:   Left Eye Distance:   Bilateral Distance:    Right Eye Near:   Left Eye Near:    Bilateral Near:     Physical Exam GEN:     alert, non-toxic appearing female in no distress    HENT:  mucus membranes  moist, oropharyngeal without lesions or exudate, no tonsillar hypertrophy, mild oropharyngeal erythema, moderate erythematous edematous turbinates, clear nasal discharge, bilateral TM normal EYES:   pupils equal and reactive, no scleral injection or discharge NECK:  normal ROM, anterior lymphadenopathy,  no meningismus   RESP:  no increased work of breathing, diffuse  coarse breath sounds CVS:   regular rate and rhythm Skin:   warm and dry, no rash on visible skin    UC Treatments / Results  Labs (all labs ordered are listed, but only abnormal results are displayed) Labs Reviewed - No data to display  EKG   Radiology DG Chest 2 View  Result Date: 06/03/2022 CLINICAL DATA:  Cough. Chest congestion. Pain and ribs when she coughs. EXAM: CHEST - 2 VIEW COMPARISON:  Two-view chest x-ray 11/07/2021 FINDINGS: The heart size is normal. Lungs are clear. No edema or effusion is present. The visualized soft tissues and bony thorax are unremarkable. IMPRESSION: Negative chest x-ray. Electronically Signed   By: Marin Roberts M.D.   On: 06/03/2022 14:24    Procedures Procedures (including critical care time)  Medications Ordered in UC Medications - No data to display  Initial Impression / Assessment and Plan / UC Course  I have reviewed the triage vital signs and the nursing notes.  Pertinent labs & imaging results that were available during my care of the patient were reviewed by me and considered in my medical decision making (see chart for details).       Pt is a 57 y.o. female who presents for 6 days of respiratory symptoms. [place]ta is afebrile here without recent antipyretics. Satting well on room air. Overall pt is non-toxic appearing, well hydrated, without respiratory distress. Pulmonary exam is remarkable for diffuse coarse breath sounds.  Home COVID test was negative. History consistent with viral respiratory illness. Discussed symptomatic treatment.  Explained lack of efficacy of  antibiotics in viral disease.  Trial steroid taper. Typical duration of symptoms discussed.  Promethazine DM cough syrup to allow patient to rest at night.  Return and ED precautions given and voiced understanding. Discussed MDM, treatment plan and plan for follow-up with patient who agrees with plan.     Final Clinical Impressions(s) / UC Diagnoses   Final diagnoses:  Viral URI with cough     Discharge Instructions      You xray did not show a pneumonia. Stop by the pharmacy to pick up your prescriptions. You can take Tylenol and/or Ibuprofen as needed for fever reduction and pain relief.    For cough: honey 1/2 to 1 teaspoon (you can dilute the honey in water or another fluid).  You can also use guaifenesin and dextromethorphan for cough. You can use a humidifier for chest congestion and cough.  If you don't have a humidifier, you can sit in the bathroom with the hot shower running.      For sore throat: try warm salt water gargles, Mucinex sore throat cough drops or cepacol lozenges, throat spray, warm tea or water with lemon/honey, popsicles or ice, or OTC cold relief medicine for throat discomfort. You can also purchase chloraseptic spray at the pharmacy or dollar store.   For congestion: take a daily anti-histamine like Zyrtec, Claritin, and a oral decongestant, such as pseudoephedrine.  You can also use Flonase 1-2 sprays in each nostril daily. Afrin is also a good option, if you do not have high blood pressure.    It is important to stay hydrated: drink plenty of fluids (water, gatorade/powerade/pedialyte, juices, or teas) to keep your throat moisturized and help further relieve irritation/discomfort.    Return or go to the Emergency Department if symptoms worsen or do not improve in the next few days      ED Prescriptions     Medication Sig Dispense Auth. Provider  predniSONE (STERAPRED UNI-PAK 21 TAB) 10 MG (21) TBPK tablet Take by mouth daily. Take 6 tabs by mouth daily  for 1, then 5 tabs for 1 day, then 4 tabs for 1 day, then 3 tabs for 1 day, then 2 tabs for 1 day, then 1 tab for 1 day. 21 tablet Katha Cabal, DO      PDMP not reviewed this encounter.   Katha Cabal, DO 06/03/22 1548

## 2022-06-03 NOTE — ED Triage Notes (Signed)
Pt presents with cough and chest congestion for 1 week. She is starting to develop pain in her chest and ribs when she coughs.

## 2022-06-03 NOTE — Discharge Instructions (Addendum)
You xray did not show a pneumonia. Stop by the pharmacy to pick up your prescriptions. You can take Tylenol and/or Ibuprofen as needed for fever reduction and pain relief.    For cough: honey 1/2 to 1 teaspoon (you can dilute the honey in water or another fluid).  You can also use guaifenesin and dextromethorphan for cough. You can use a humidifier for chest congestion and cough.  If you don't have a humidifier, you can sit in the bathroom with the hot shower running.      For sore throat: try warm salt water gargles, Mucinex sore throat cough drops or cepacol lozenges, throat spray, warm tea or water with lemon/honey, popsicles or ice, or OTC cold relief medicine for throat discomfort. You can also purchase chloraseptic spray at the pharmacy or dollar store.   For congestion: take a daily anti-histamine like Zyrtec, Claritin, and a oral decongestant, such as pseudoephedrine.  You can also use Flonase 1-2 sprays in each nostril daily. Afrin is also a good option, if you do not have high blood pressure.    It is important to stay hydrated: drink plenty of fluids (water, gatorade/powerade/pedialyte, juices, or teas) to keep your throat moisturized and help further relieve irritation/discomfort.    Return or go to the Emergency Department if symptoms worsen or do not improve in the next few days

## 2022-07-09 ENCOUNTER — Encounter: Payer: Self-pay | Admitting: Family Medicine

## 2022-07-09 ENCOUNTER — Ambulatory Visit
Admission: RE | Admit: 2022-07-09 | Discharge: 2022-07-09 | Disposition: A | Discharge status: Home / Self Care | Payer: BC Managed Care – PPO | Source: Ambulatory Visit | Attending: Family Medicine | Admitting: Family Medicine

## 2022-07-09 ENCOUNTER — Ambulatory Visit: Payer: BC Managed Care – PPO | Admitting: Family Medicine

## 2022-07-09 VITALS — BP 120/74 | HR 64 | Ht 71.0 in | Wt 219.0 lb

## 2022-07-09 DIAGNOSIS — R431 Parosmia: Secondary | ICD-10-CM | POA: Diagnosis not present

## 2022-07-09 DIAGNOSIS — R220 Localized swelling, mass and lump, head: Secondary | ICD-10-CM | POA: Diagnosis not present

## 2022-07-09 NOTE — Progress Notes (Signed)
Date:  07/09/2022   Name:  Sherri Matthews   DOB:  1965-10-15   MRN:  [id number]   Chief Complaint: knot in lip/ mouth  Patient is a 57 year old female who presents for a oral/lip nodule exam. The patient reports the following problems: upper lip nodule. Health maintenance has been reviewed up to day.      Lab Results  Component Value Date   NA 142 04/23/2022   K 4.3 04/23/2022   CO2 21 04/23/2022   GLUCOSE 86 04/23/2022   BUN 11 04/23/2022   CREATININE 0.74 04/23/2022   CALCIUM 9.3 04/23/2022   EGFR 94 04/23/2022   GFRNONAA >60 11/09/2021   Lab Results  Component Value Date   CHOL 190 04/23/2022   HDL 56 04/23/2022   LDLCALC 115 (H) 04/23/2022   TRIG 105 04/23/2022   CHOLHDL 4.6 (H) 02/24/2018   Lab Results  Component Value Date   TSH 0.910 11/01/2017   No results found for: "HGBA1C" Lab Results  Component Value Date   WBC 5.7 04/23/2022   HGB 12.1 04/23/2022   HCT 36.8 04/23/2022   MCV 82 04/23/2022   PLT 270 04/23/2022   Lab Results  Component Value Date   ALT 18 04/23/2022   AST 23 04/23/2022   ALKPHOS 118 04/23/2022   BILITOT 0.7 04/23/2022   No results found for: "25OHVITD2", "25OHVITD3", "VD25OH"   Review of Systems  Constitutional:  Positive for fatigue. Negative for chills, diaphoresis, fever and unexpected weight change.  HENT:  Negative for congestion, nosebleeds, postnasal drip, rhinorrhea, sinus pressure, sinus pain and trouble swallowing.   Eyes:  Negative for visual disturbance.  Respiratory:  Negative for cough, chest tightness and shortness of breath.   Cardiovascular:  Negative for chest pain and palpitations.  Gastrointestinal:  Positive for abdominal pain.       Right flank "drawing"    Patient Active Problem List   Diagnosis Date Noted   Tinea corporis 04/10/2021   Cervical spondylosis 11/20/2020   Segmental and somatic dysfunction of cervical region 11/20/2020   Obstructive sleep apnea 12/25/2019   Intractable migraine  with aura without status migrainosus 06/21/2016   Chest pain 06/19/2016   Abnormal EKG 06/19/2016   Menopause 01/28/2016   Heart palpitations 10/09/2015   SOB (shortness of breath) 09/24/2015   Diverticulitis 06/04/2015   Lower back pain 06/04/2015   SVT (supraventricular tachycardia) 06/04/2015   Essential hypertension 05/15/2015   Hyperlipidemia 05/15/2015   Gastritis    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     Vicodin [Hydrocodone-Acetaminophen] Palpitations   Dilaudid [Hydromorphone Hcl] Other (See Comments)   Fenofibrate Other (See Comments)    Myalgia    Past Surgical History:  Procedure Laterality Date   APPENDECTOMY  02/22/2006   BREAST BIOPSY Left 09/22/2021   u/s bx, mass 1:00, RIBBON clip-path pending   BREAST CYST ASPIRATION     CARDIAC CATHETERIZATION N/A 09/12/2015   Procedure: Left Heart Cath and Coronary Angiography;  Surgeon: Marcina Millard, MD;  Location: [facility];  Service: Cardiovascular;  Laterality:  N/A;   ESOPHAGOGASTRODUODENOSCOPY (EGD) WITH PROPOFOL N/A 04/18/2015   Procedure: ESOPHAGOGASTRODUODENOSCOPY (EGD) WITH PROPOFOL;  Surgeon: Midge Minium, MD;  Location: [facility];  Service: Endoscopy;  Laterality: N/A;   HERNIA REPAIR      Social History   Tobacco Use   Smoking status: Never   Smokeless tobacco: Never  Vaping Use   Vaping Use: Never used  Substance Use Topics   Alcohol use: Not Currently   Drug use: Never     Medication list has been reviewed  and updated.  Current Meds  Medication Sig   ALPRAZolam (XANAX) 0.5 MG tablet TAKE ONE TABLET BY MOUTH ONCE DAILY AS NEEDED   amLODipine (NORVASC) 10 MG tablet Take 0.5 tablets (5 mg total) by mouth in the morning and at bedtime.   B Complex-C (SUPER B COMPLEX PO) Take 1 tablet by mouth daily.   BIOTIN PO Take 1 capsule by mouth daily.   Magnesium 250 MG TABS Take 1 tablet by mouth daily.   metoprolol tartrate (LOPRESSOR) 25 MG tablet Take 1/2 (one-half) tablet by mouth twice daily   omeprazole (PRILOSEC) 40 MG capsule TAKE 1 CAPSULE BY MOUTH TWICE DAILY.   simvastatin (ZOCOR) 40 MG tablet Take 1 tablet (40 mg total) by mouth daily.   [DISCONTINUED] predniSONE (STERAPRED UNI-PAK 21 TAB) 10 MG (21) TBPK tablet Take by mouth daily. Take 6 tabs by mouth daily for 1, then 5 tabs for 1 day, then 4 tabs for 1 day, then 3 tabs for 1 day, then 2 tabs for 1 day, then 1 tab for 1 day.       07/09/2022    3:18 PM 04/23/2022   10:08 AM 10/23/2021    9:34 AM 04/28/2021    8:06 AM  GAD 7 : Generalized Anxiety Score  Nervous, Anxious, on Edge 0 0 0 0  Control/stop worrying 0 0 0 0  Worry too much - different things 0 0 0 0  Trouble relaxing 0 0 0 0  Restless 0 0 0 0  Easily annoyed or irritable 0 0 0 0  Afraid - awful might happen 0 0 0 0  Total GAD 7 Score 0 0 0 0  Anxiety Difficulty Not difficult at all Not difficult at all Not difficult at all Not difficult at all       07/09/2022    3:17 PM 04/23/2022   10:08 AM 10/23/2021    9:34 AM  Depression screen PHQ 2/9  Decreased Interest 0 0 0  Down, Depressed, Hopeless 0 0 0  PHQ - 2 Score 0 0 0  Altered sleeping 0 0 0  Tired, decreased energy 0 0 0  Change in appetite 0 0 0  Feeling bad or failure about yourself  0 0 0  Trouble concentrating 0 0 0  Moving slowly or fidgety/restless 0 0 0  Suicidal thoughts 0 0 0  PHQ-9 Score 0 0 0  Difficult doing work/chores Not difficult at all Not difficult at all Not difficult at all    BP Readings from  Last 3 Encounters:  07/09/22 120/74  06/03/22 126/72  05/09/22 134/66    Physical Exam Vitals and nursing note reviewed.  Constitutional:      Appearance: Normal appearance.  HENT:     Right Ear: Tympanic membrane and ear canal normal.     Left Ear: Tympanic membrane and ear canal normal.     Nose: Nose normal.     Mouth/Throat:     Lips:  No lesions.     Mouth: Mucous membranes are moist.     Dentition: Normal dentition.     Pharynx: Oropharynx is clear.     Comments: Two white paraell lines left lower incisor/palpable left upper lip fullness /swelling relative to right /transillumination negative Lymphadenopathy:     Head:     Right side of head: No submental, submandibular or tonsillar adenopathy.     Left side of head: No submental, submandibular or tonsillar adenopathy.     Cervical: No cervical adenopathy.     Right cervical: No superficial, deep or posterior cervical adenopathy.    Left cervical: No superficial, deep or posterior cervical adenopathy.  Neurological:     Mental Status: She is alert.     Wt Readings from Last 3 Encounters:  07/09/22 219 lb (99.3 kg)  05/09/22 218 lb 14.7 oz (99.3 kg)  04/23/22 219 lb (99.3 kg)    BP 120/74   Pulse 64   Ht 5\' 11"  (1.803 m)   Wt 219 lb (99.3 kg)   LMP 04/18/2017 (Approximate)   SpO2 96%   BMI 30.54 kg/m   Assessment and Plan:  1. Swelling of upper lip New onset.  First appearance of the right upper lip of a fullness which decreased in size and then a secondary fullness that began to slowly enlarge over the past couple of months.  I can feel an area roll underneath my index finger.  There is no transillumination of a darkness.  There is no palpable adenopathy in the cervical and oral aspect.  Oral exam is negative except for 2 small parallel white lines under the left lower incisor which I do not know if is in extension of her incisor root.  There is no ulcerations no lacy appearance of the buccal mucosa no erythema  or leukoplakia.  Patient will be seeing her dentist early in June and we will request for them to take a look at these concerns and if there is an oral surgeon that they prefer to work with and if they would make a referral or I would be glad to make the referral if patient would like to but to the oral surgeon of their choice.  2. 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  She will be sitting there and have a smell of smoke.  We will initiate evaluation with x-rays of the sinuses to see if there is a chronic sinusitis and we will refer to ear nose and throat for upcoming evaluation of olfactory abnormality. - DG Sinuses Complete; Future - Ambulatory referral to ENT    Elizabeth Sauer, MD

## 2022-07-18 ENCOUNTER — Other Ambulatory Visit: Payer: Self-pay | Admitting: Family Medicine

## 2022-07-18 DIAGNOSIS — I10 Essential (primary) hypertension: Secondary | ICD-10-CM

## 2022-08-06 ENCOUNTER — Other Ambulatory Visit: Payer: Self-pay | Admitting: Family Medicine

## 2022-08-06 DIAGNOSIS — Z1231 Encounter for screening mammogram for malignant neoplasm of breast: Secondary | ICD-10-CM

## 2022-09-06 ENCOUNTER — Ambulatory Visit
Admission: RE | Admit: 2022-09-06 | Discharge: 2022-09-06 | Disposition: A | Discharge status: Home / Self Care | Payer: BC Managed Care – PPO | Source: Ambulatory Visit | Attending: Family Medicine | Admitting: Family Medicine

## 2022-09-06 DIAGNOSIS — Z1231 Encounter for screening mammogram for malignant neoplasm of breast: Secondary | ICD-10-CM | POA: Insufficient documentation

## 2022-10-15 ENCOUNTER — Other Ambulatory Visit: Payer: Self-pay | Admitting: Family Medicine

## 2022-10-15 DIAGNOSIS — I10 Essential (primary) hypertension: Secondary | ICD-10-CM

## 2022-10-16 ENCOUNTER — Other Ambulatory Visit: Payer: Self-pay | Admitting: Family Medicine

## 2022-10-16 DIAGNOSIS — I10 Essential (primary) hypertension: Secondary | ICD-10-CM

## 2022-10-16 DIAGNOSIS — K219 Gastro-esophageal reflux disease without esophagitis: Secondary | ICD-10-CM

## 2022-10-26 ENCOUNTER — Ambulatory Visit: Payer: BC Managed Care – PPO | Admitting: Family Medicine

## 2022-10-28 ENCOUNTER — Ambulatory Visit: Payer: BC Managed Care – PPO | Admitting: Family Medicine

## 2022-11-15 ENCOUNTER — Encounter: Payer: Self-pay | Admitting: Family Medicine

## 2022-11-15 ENCOUNTER — Ambulatory Visit: Payer: BC Managed Care – PPO | Admitting: Family Medicine

## 2022-11-15 VITALS — BP 102/70 | HR 76 | Ht 71.0 in | Wt 220.0 lb

## 2022-11-15 DIAGNOSIS — I10 Essential (primary) hypertension: Secondary | ICD-10-CM

## 2022-11-15 DIAGNOSIS — E7849 Other hyperlipidemia: Secondary | ICD-10-CM | POA: Diagnosis not present

## 2022-11-15 DIAGNOSIS — E041 Nontoxic single thyroid nodule: Secondary | ICD-10-CM

## 2022-11-15 DIAGNOSIS — R5383 Other fatigue: Secondary | ICD-10-CM

## 2022-11-15 DIAGNOSIS — F064 Anxiety disorder due to known physiological condition: Secondary | ICD-10-CM | POA: Diagnosis not present

## 2022-11-15 DIAGNOSIS — K219 Gastro-esophageal reflux disease without esophagitis: Secondary | ICD-10-CM

## 2022-11-15 MED ORDER — OMEPRAZOLE 40 MG PO CPDR
DELAYED_RELEASE_CAPSULE | ORAL | 1 refills | Status: AC
Start: 2022-11-15 — End: ?

## 2022-11-15 MED ORDER — METOPROLOL TARTRATE 25 MG PO TABS
ORAL_TABLET | ORAL | 1 refills | Status: AC
Start: 2022-11-15 — End: ?

## 2022-11-15 MED ORDER — AMLODIPINE BESYLATE 10 MG PO TABS
10.0000 mg | ORAL_TABLET | Freq: Every day | ORAL | 1 refills | Status: AC
Start: 2022-11-15 — End: ?

## 2022-11-15 MED ORDER — ALPRAZOLAM 0.5 MG PO TABS
ORAL_TABLET | ORAL | 2 refills | Status: AC
Start: 2022-11-15 — End: ?

## 2022-11-15 MED ORDER — SIMVASTATIN 40 MG PO TABS
40.0000 mg | ORAL_TABLET | Freq: Every day | ORAL | 1 refills | Status: AC
Start: 2022-11-15 — End: ?

## 2022-11-15 NOTE — Progress Notes (Unsigned)
Date:  11/15/2022   Name:  Sherri Matthews   DOB:  June 19, 1965   MRN:  [id number]   Chief Complaint: Hyperlipidemia, Hypertension, Gastroesophageal Reflux, and Anxiety  Hyperlipidemia This is a chronic problem. The current episode started more than 1 year ago. The problem is controlled. She has no history of chronic renal disease, diabetes or hypothyroidism. Pertinent negatives include no chest pain, focal sensory loss, focal weakness, myalgias or shortness of breath. Current antihyperlipidemic treatment includes statins. The current treatment provides moderate improvement of lipids. There are no compliance problems.  Risk factors for coronary artery disease include dyslipidemia.  Hypertension This is a chronic problem. The current episode started more than 1 year ago. The problem has been gradually improving since onset. The problem is controlled. Associated symptoms include anxiety. Pertinent negatives include no chest pain, headaches, orthopnea, palpitations, peripheral edema, PND or shortness of breath. There are no associated agents to hypertension. Past treatments include calcium channel blockers and beta blockers. The current treatment provides moderate improvement. There are no compliance problems.  There is no history of angina, kidney disease, CAD/MI or CVA. Identifiable causes of hypertension include a thyroid problem. There is no history of chronic renal disease, a hypertension causing med or renovascular disease.  Gastroesophageal Reflux She complains of a hoarse voice. She reports no abdominal pain, no belching, no chest pain, no choking, no coughing, no dysphagia, no heartburn, no nausea or no wheezing. This is a chronic problem. The problem has been gradually improving. The symptoms are aggravated by certain foods. Associated symptoms include fatigue. Pertinent negatives include no weight loss.  Anxiety Presents for follow-up visit. Symptoms include nervous/anxious behavior.  Patient reports no chest pain, decreased concentration, depressed mood, excessive worry, impotence, irritability, nausea, obsessions, palpitations or shortness of breath. Symptoms occur occasionally. The severity of symptoms is mild.    Thyroid Problem Presents for follow-up visit. Symptoms include anxiety, diarrhea, dry skin, fatigue, hair loss, heat intolerance, hoarse voice and nail problem. Patient reports no cold intolerance, constipation, depressed mood, diaphoresis, leg swelling, menstrual problem, palpitations, tremors, visual change, weight gain or weight loss. The symptoms have been worsening. Her past medical history is significant for hyperlipidemia. There is no history of diabetes.    Lab Results  Component Value Date   NA 142 04/23/2022   K 4.3 04/23/2022   CO2 21 04/23/2022   GLUCOSE 86 04/23/2022   BUN 11 04/23/2022   CREATININE 0.74 04/23/2022   CALCIUM 9.3 04/23/2022   EGFR 94 04/23/2022   GFRNONAA >60 11/09/2021   Lab Results  Component Value Date   CHOL 190 04/23/2022   HDL 56 04/23/2022   LDLCALC 115 (H) 04/23/2022   TRIG 105 04/23/2022   CHOLHDL 4.6 (H) 02/24/2018   Lab Results  Component Value Date   TSH 0.910 11/01/2017   No results found for: "HGBA1C" Lab Results  Component Value Date   WBC 5.7 04/23/2022   HGB 12.1 04/23/2022   HCT 36.8 04/23/2022   MCV 82 04/23/2022   PLT 270 04/23/2022   Lab Results  Component Value Date   ALT 18 04/23/2022   AST 23 04/23/2022   ALKPHOS 118 04/23/2022   BILITOT 0.7 04/23/2022   No results found for: "25OHVITD2", "25OHVITD3", "VD25OH"   Review of Systems  Constitutional:  Positive for fatigue. Negative for diaphoresis, irritability, weight gain and weight loss.  HENT:  Positive for hoarse voice. Negative for congestion.   Eyes:  Negative for visual disturbance.  Respiratory:  Negative for apnea, cough, choking, chest tightness, shortness of breath and wheezing.   Cardiovascular:  Negative for chest  pain, palpitations, orthopnea and PND.  Gastrointestinal:  Positive for diarrhea. Negative for abdominal pain, constipation, dysphagia, heartburn and nausea.  Endocrine: Positive for heat intolerance. Negative for cold intolerance.  Genitourinary:  Negative for impotence and menstrual problem.  Musculoskeletal:  Negative for myalgias.  Neurological:  Negative for tremors, focal weakness and headaches.  Psychiatric/Behavioral:  Negative for decreased concentration. The patient is nervous/anxious.     Patient Active Problem List   Diagnosis Date Noted   Tinea corporis 04/10/2021   Cervical spondylosis 11/20/2020   Segmental and somatic dysfunction of cervical region 11/20/2020   Obstructive sleep apnea 12/25/2019   Intractable migraine with aura without status migrainosus 06/21/2016   Chest pain 06/19/2016   Abnormal EKG 06/19/2016   Menopause 01/28/2016   Heart palpitations 10/09/2015   SOB (shortness of breath) 09/24/2015   Diverticulitis 06/04/2015   Lower back pain 06/04/2015   SVT (supraventricular tachycardia) 06/04/2015   Essential hypertension 05/15/2015   Hyperlipidemia 05/15/2015   Gastritis    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     Vicodin [Hydrocodone-Acetaminophen] Palpitations   Dilaudid [Hydromorphone Hcl] Other (See Comments)   Fenofibrate Other (See Comments)    Myalgia    Past Surgical History:  Procedure Laterality Date   APPENDECTOMY  02/22/2006   BREAST BIOPSY Left 09/22/2021   u/s bx,  mass 1:00, RIBBON clip-path pending   BREAST CYST ASPIRATION     CARDIAC CATHETERIZATION N/A 09/12/2015   Procedure: Left Heart Cath and Coronary Angiography;  Surgeon: Marcina Millard, MD;  Location: [facility];  Service: Cardiovascular;  Laterality: N/A;   ESOPHAGOGASTRODUODENOSCOPY (EGD) WITH PROPOFOL N/A 04/18/2015   Procedure: ESOPHAGOGASTRODUODENOSCOPY (EGD) WITH PROPOFOL;  Surgeon: Midge Minium, MD;  Location: [facility];  Service: Endoscopy;  Laterality: N/A;   HERNIA REPAIR      Social History   Tobacco Use   Smoking status: Never   Smokeless tobacco: Never  Vaping Use   Vaping status: Never Used  Substance Use Topics   Alcohol use: Not Currently   Drug use: Never     Medication list has been reviewed and updated.  Current Meds  Medication Sig   acetaminophen (TYLENOL) 650 MG CR tablet Take 650 mg by mouth every 8 (eight) hours as needed for pain.   ALPRAZolam (XANAX) 0.5 MG tablet TAKE ONE TABLET BY MOUTH ONCE DAILY AS NEEDED   amLODipine (NORVASC) 10 MG tablet Take 1/2 (one-half) tablet by mouth twice daily   B Complex-C (SUPER B COMPLEX PO) Take 1 tablet by mouth daily.   BIOTIN PO Take 1 capsule by mouth daily.   Magnesium 250 MG TABS Take 1 tablet by mouth daily.   metoprolol tartrate (LOPRESSOR) 25 MG tablet Take 1/2 (one-half) tablet by mouth twice daily   omeprazole (PRILOSEC) 40 MG capsule Take 1 capsule by mouth twice daily   simvastatin (ZOCOR) 40 MG tablet Take 1 tablet (40 mg total) by mouth daily.  11/15/2022    4:05 PM 07/09/2022    3:18 PM 04/23/2022   10:08 AM 10/23/2021    9:34 AM  GAD 7 : Generalized Anxiety Score  Nervous, Anxious, on Edge 0 0 0 0  Control/stop worrying 0 0 0 0  Worry too much - different things 0 0 0 0  Trouble relaxing 0 0 0 0  Restless 0 0 0 0  Easily annoyed or irritable 0 0 0 0  Afraid - awful might happen 0 0 0 0  Total GAD 7 Score 0 0 0 0  Anxiety Difficulty Not difficult at all Not difficult  at all Not difficult at all Not difficult at all       11/15/2022    4:05 PM 07/09/2022    3:17 PM 04/23/2022   10:08 AM  Depression screen PHQ 2/9  Decreased Interest 0 0 0  Down, Depressed, Hopeless 0 0 0  PHQ - 2 Score 0 0 0  Altered sleeping 0 0 0  Tired, decreased energy 0 0 0  Change in appetite 0 0 0  Feeling bad or failure about yourself  0 0 0  Trouble concentrating 0 0 0  Moving slowly or fidgety/restless 0 0 0  Suicidal thoughts 0 0 0  PHQ-9 Score 0 0 0  Difficult doing work/chores Not difficult at all Not difficult at all Not difficult at all    BP Readings from Last 3 Encounters:  11/15/22 102/70  07/09/22 120/74  06/03/22 126/72    Physical Exam Vitals and nursing note reviewed.  HENT:     Head: Normocephalic.     Right Ear: Tympanic membrane, ear canal and external ear normal.     Left Ear: Tympanic membrane, ear canal and external ear normal.     Nose: Nose normal.     Mouth/Throat:     Mouth: Mucous membranes are moist.  Neck:     Thyroid: No thyroid mass, thyromegaly or thyroid tenderness.     Comments: Symmetrical/no palpable nodule Cardiovascular:     Rate and Rhythm: Normal rate and regular rhythm.     Pulses: Normal pulses.     Heart sounds: Normal heart sounds, S1 normal and S2 normal.     No systolic murmur is present.     No diastolic murmur is present.     No S3 or S4 sounds.  Pulmonary:     Breath sounds: Normal breath sounds. No decreased breath sounds, wheezing, rhonchi or rales.  Abdominal:     Palpations: Abdomen is soft. There is no hepatomegaly or splenomegaly.  Musculoskeletal:     Cervical back: Neck supple.  Lymphadenopathy:     Head:     Right side of head: No submandibular adenopathy.     Left side of head: No submandibular adenopathy.     Cervical: No cervical adenopathy.     Right cervical: No superficial, deep or posterior cervical adenopathy.    Left cervical: No superficial, deep or posterior cervical adenopathy.   Neurological:     Mental Status: She is alert.     Wt Readings from Last 3 Encounters:  11/15/22 220 lb (99.8 kg)  07/09/22 219 lb (99.3 kg)  05/09/22 218 lb 14.7 oz (99.3 kg)    BP 102/70   Pulse 76   Ht 5\' 11"  (1.803 m)   Wt 220 lb (99.8 kg)   LMP 04/18/2017 (Approximate)   SpO2 96%   BMI 30.68 kg/m   Assessment and Plan:  1. Essential (primary) hypertension Chronic.  Controlled.  Stable.  Blood pressure 102/70.  Asymptomatic.  Tolerating medication well.  Continue amlodipine 10 mg once a day and metoprolol 25 mg 1/2 tablet twice a day.  Will check renal function panel for electrolytes and GFR. - ALPRAZolam (XANAX) 0.5 MG tablet; TAKE ONE TABLET BY MOUTH ONCE DAILY AS NEEDED  Dispense: 30 tablet; Refill: 2 - amLODipine (NORVASC) 10 MG tablet; Take 1 tablet (10 mg total) by mouth daily.  Dispense: 90 tablet; Refill: 1 - metoprolol tartrate (LOPRESSOR) 25 MG tablet; Take 1/2 (one-half) tablet by mouth twice daily  Dispense: 90 tablet; Refill: 1 - Renal Function Panel  2. 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  Controlled.  Stable.  PHQ was 0.  GAD score is 0.  Continue alprazolam 0.5 once a day as needed. - ALPRAZolam (XANAX) 0.5 MG tablet; TAKE ONE TABLET BY MOUTH ONCE DAILY AS NEEDED  Dispense: 30 tablet; Refill: 2  3. Gastroesophageal reflux disease Chronic.  Controlled.  Stable.  Continue omeprazole 40 mg once a day. - omeprazole (PRILOSEC) 40 MG capsule; TAKE 1 CAPSULE BY MOUTH TWICE DAILY.  Dispense: 180 capsule; Refill: 1  4. Familial multiple lipoprotein-type hyperlipidemia Chronic.  Controlled.  Stable.  Asymptomatic.  Without myalgias.  Tolerating current dosing of simvastatin 40 mg 40 mg once a day which will be continued. - simvastatin (ZOCOR) 40 MG tablet; Take 1 tablet (40 mg total) by mouth daily.  Dispense: 90 tablet; Refill: 1  5. Thyroid nodule Chronic.  Questionable persistent.  Stable.  In 2019 patient was noted to have a thyroid  nodule that was at the upper limits of normal which was referred to ear nose and throat for evaluation.  I am uncertain if patient saw Dr. Hannah Beat for this but we will inquire in the meantime we will send for consult for ear nose and throat and repeat an ultrasound of the thyroid to evaluate previous thyroid nodules.  Patient has been having increasing fatigue as noted below - US THYROID; Future - Ambulatory referral to ENT - Ambulatory referral to ENT  6. Fatigue, unspecified type Chronic.  Persistent.  Stable.  Patient's had ongoing fatigue with issues with her hair skin and nails.  We will check a thyroid panel with TSH to note current level of thyroid function and proceed accordingly. - Thyroid Panel With TSH - Thyroid Panel With TSH    Elizabeth Sauer, MD

## 2022-11-16 LAB — RENAL FUNCTION PANEL
Albumin: 4.4 g/dL (ref 3.8–4.9)
BUN/Creatinine Ratio: 23 (ref 9–23)
BUN: 18 mg/dL (ref 6–24)
CO2: 22 mmol/L (ref 20–29)
Calcium: 9.5 mg/dL (ref 8.7–10.2)
Chloride: 103 mmol/L (ref 96–106)
Creatinine, Ser: 0.78 mg/dL (ref 0.57–1.00)
Glucose: 87 mg/dL (ref 70–99)
Phosphorus: 3.9 mg/dL (ref 3.0–4.3)
Potassium: 4 mmol/L (ref 3.5–5.2)
Sodium: 141 mmol/L (ref 134–144)
eGFR: 87 mL/min/{1.73_m2} (ref >59)

## 2022-11-16 LAB — THYROID PANEL WITH TSH
Free Thyroxine Index: 2.3 (ref 1.2–4.9)
T3 Uptake Ratio: 25 % (ref 24–39)
T4, Total: 9.1 ug/dL (ref 4.5–12.0)
TSH: 1.17 u[IU]/mL (ref 0.450–4.500)

## 2022-11-23 ENCOUNTER — Ambulatory Visit: Payer: BC Managed Care – PPO

## 2023-01-04 ENCOUNTER — Ambulatory Visit: Payer: BC Managed Care – PPO | Admitting: Family Medicine

## 2023-01-04 ENCOUNTER — Encounter: Payer: Self-pay | Admitting: Family Medicine

## 2023-01-04 VITALS — BP 118/76 | HR 59 | Ht 71.0 in | Wt 225.0 lb

## 2023-01-04 DIAGNOSIS — R1031 Right lower quadrant pain: Secondary | ICD-10-CM | POA: Diagnosis not present

## 2023-01-04 DIAGNOSIS — R102 Pelvic and perineal pain: Secondary | ICD-10-CM | POA: Diagnosis not present

## 2023-01-04 DIAGNOSIS — R1084 Generalized abdominal pain: Secondary | ICD-10-CM | POA: Diagnosis not present

## 2023-01-04 NOTE — Progress Notes (Signed)
Date:  01/04/2023   Name:  Sherri Matthews   DOB:  1966-01-21   MRN:  [id number]   Chief Complaint: Abdominal Pain (X5 months, getting worse, Right side, pulling sensation, has mesh on that side of stomach from hernia surgery, dull pain, feels like she is laying on something when going to bed )  Abdominal Pain This is a new problem. The current episode started more than 1 month ago. The onset quality is gradual. The problem occurs constantly. The problem has been waxing and waning. The pain is located in the RLQ. The pain is at a severity of 6/10. The pain is moderate. The quality of the pain is aching. The abdominal pain radiates to the right flank. Associated symptoms include nausea. Pertinent negatives include no anorexia, arthralgias, belching, constipation, diarrhea, dysuria, fever, flatus, frequency, headaches, hematochezia, hematuria, melena, myalgias, vomiting or weight loss. Exacerbated by: sleeping on right side/roll over at night. The pain is relieved by Certain positions (ibuprofen). Treatments tried: nsaid. The treatment provided moderate relief.    Lab Results  Component Value Date   NA 141 11/15/2022   K 4.0 11/15/2022   CO2 22 11/15/2022   GLUCOSE 87 11/15/2022   BUN 18 11/15/2022   CREATININE 0.78 11/15/2022   CALCIUM 9.5 11/15/2022   EGFR 87 11/15/2022   GFRNONAA >60 11/09/2021   Lab Results  Component Value Date   CHOL 190 04/23/2022   HDL 56 04/23/2022   LDLCALC 115 (H) 04/23/2022   TRIG 105 04/23/2022   CHOLHDL 4.6 (H) 02/24/2018   Lab Results  Component Value Date   TSH 1.170 11/15/2022   No results found for: "HGBA1C" Lab Results  Component Value Date   WBC 5.7 04/23/2022   HGB 12.1 04/23/2022   HCT 36.8 04/23/2022   MCV 82 04/23/2022   PLT 270 04/23/2022   Lab Results  Component Value Date   ALT 18 04/23/2022   AST 23 04/23/2022   ALKPHOS 118 04/23/2022   BILITOT 0.7 04/23/2022   No results found for: "25OHVITD2", "25OHVITD3", "VD25OH"    Review of Systems  Constitutional:  Negative for chills, fever, unexpected weight change and weight loss.  HENT:  Negative for nosebleeds, postnasal drip, rhinorrhea and sinus pressure.   Respiratory:  Negative for choking, shortness of breath and wheezing.   Cardiovascular:  Negative for chest pain, palpitations and leg swelling.  Gastrointestinal:  Positive for abdominal pain and nausea. Negative for anal bleeding, anorexia, constipation, diarrhea, flatus, hematochezia, melena and vomiting.  Genitourinary:  Negative for dyspareunia, dysuria, frequency, hematuria, menstrual problem, vaginal bleeding and vaginal pain.  Musculoskeletal:  Negative for arthralgias and myalgias.  Neurological:  Negative for headaches.    Patient Active Problem List   Diagnosis Date Noted   Tinea corporis 04/10/2021   Cervical spondylosis 11/20/2020   Segmental and somatic dysfunction of cervical region 11/20/2020   Obstructive sleep apnea 12/25/2019   Intractable migraine with aura without status migrainosus 06/21/2016   Chest pain 06/19/2016   Abnormal EKG 06/19/2016   Menopause 01/28/2016   Heart palpitations 10/09/2015   SOB (shortness of breath) 09/24/2015   Diverticulitis 06/04/2015   Lower back pain 06/04/2015   SVT (supraventricular tachycardia) (HCC) 06/04/2015   Essential hypertension 05/15/2015   Hyperlipidemia 05/15/2015   Gastritis    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     Vicodin [Hydrocodone-Acetaminophen]  Palpitations   Dilaudid [Hydromorphone Hcl] Other (See Comments)   Fenofibrate Other (See Comments)    Myalgia    Past Surgical History:  Procedure Laterality Date   APPENDECTOMY  02/22/2006   BREAST BIOPSY Left 09/22/2021   u/s bx, mass 1:00, RIBBON clip-path pending   BREAST CYST ASPIRATION     CARDIAC CATHETERIZATION N/A 09/12/2015   Procedure: Left Heart Cath and Coronary Angiography;  Surgeon: Marcina Millard, MD;  Location: [facility];  Service: Cardiovascular;  Laterality: N/A;   ESOPHAGOGASTRODUODENOSCOPY (EGD) WITH PROPOFOL N/A 04/18/2015   Procedure: ESOPHAGOGASTRODUODENOSCOPY (EGD) WITH PROPOFOL;  Surgeon: Midge Minium, MD;  Location: [facility];  Service: Endoscopy;  Laterality: N/A;   HERNIA REPAIR      Social History   Tobacco Use   Smoking status: Never   Smokeless tobacco: Never  Vaping Use   Vaping status: Never Used  Substance Use Topics   Alcohol use: Not Currently   Drug use: Never     Medication list has been reviewed and updated.  Current Meds  Medication Sig   ALPRAZolam (XANAX) 0.5 MG tablet TAKE ONE TABLET BY MOUTH ONCE DAILY AS NEEDED   amLODipine (NORVASC) 10 MG tablet Take 1 tablet (10 mg total) by mouth daily.   B Complex-C (SUPER B COMPLEX PO) Take 1 tablet by mouth daily.   BIOTIN PO Take 1 capsule by mouth daily.   IBUPROFEN PO Take by mouth.   Magnesium 250 MG TABS Take 1 tablet by mouth daily.   metoprolol tartrate (LOPRESSOR) 25 MG tablet Take 1/2 (one-half) tablet by mouth twice daily   omeprazole (PRILOSEC) 40 MG capsule TAKE 1 CAPSULE BY MOUTH TWICE DAILY.   simvastatin (ZOCOR) 40 MG tablet Take 1 tablet (40 mg total) by mouth daily.       11/15/2022    4:05 PM 07/09/2022    3:18 PM 04/23/2022   10:08 AM 10/23/2021    9:34 AM  GAD 7 : Generalized Anxiety Score  Nervous, Anxious, on Edge 0 0 0 0  Control/stop worrying 0 0 0 0  Worry too much - different things 0 0 0 0  Trouble relaxing 0 0 0 0  Restless 0 0  0 0  Easily annoyed or irritable 0 0 0 0  Afraid - awful might happen 0 0 0 0  Total GAD 7 Score 0 0 0 0  Anxiety Difficulty Not difficult at all Not difficult at all Not difficult at all Not difficult at all       11/15/2022    4:05 PM 07/09/2022    3:17 PM 04/23/2022   10:08 AM  Depression screen PHQ 2/9  Decreased Interest 0 0 0  Down, Depressed, Hopeless 0 0 0  PHQ - 2 Score 0 0 0  Altered sleeping 0 0 0  Tired, decreased energy 0 0 0  Change in appetite 0 0 0  Feeling bad or failure about yourself  0 0 0  Trouble concentrating 0 0 0  Moving slowly or fidgety/restless 0 0 0  Suicidal thoughts 0 0 0  PHQ-9 Score 0 0 0  Difficult doing work/chores Not difficult at all Not difficult at all Not  difficult at all    BP Readings from Last 3 Encounters:  01/04/23 118/76  11/15/22 102/70  07/09/22 120/74    Physical Exam Vitals and nursing note reviewed. Exam conducted with a chaperone present.  Constitutional:      General: She is not in acute distress.    Appearance: She is not diaphoretic.  HENT:     Head: Normocephalic and atraumatic.     Right Ear: External ear normal.     Left Ear: External ear normal.     Nose: Nose normal.  Eyes:     General:        Right eye: No discharge.        Left eye: No discharge.     Conjunctiva/sclera: Conjunctivae normal.     Pupils: Pupils are equal, round, and reactive to light.  Neck:     Thyroid: No thyromegaly.     Vascular: No JVD.  Cardiovascular:     Rate and Rhythm: Normal rate and regular rhythm.     Heart sounds: Normal heart sounds. No murmur heard.    No friction rub. No gallop.  Pulmonary:     Effort: Pulmonary effort is normal.     Breath sounds: Normal breath sounds.  Abdominal:     General: Bowel sounds are normal.     Palpations: Abdomen is soft. There is no mass.     Tenderness: There is abdominal tenderness in the right lower quadrant. There is guarding. There is no right CVA tenderness, left CVA tenderness or  rebound.     Hernia: No hernia is present.  Musculoskeletal:        General: Normal range of motion.     Cervical back: Normal range of motion and neck supple.  Lymphadenopathy:     Cervical: No cervical adenopathy.  Skin:    General: Skin is warm and dry.  Neurological:     Mental Status: She is alert.     Deep Tendon Reflexes: Reflexes are normal and symmetric.     Wt Readings from Last 3 Encounters:  01/04/23 225 lb (102.1 kg)  11/15/22 220 lb (99.8 kg)  07/09/22 219 lb (99.3 kg)    BP 118/76   Pulse (!) 59   Ht 5\' 11"  (1.803 m)   Wt 225 lb (102.1 kg)   LMP 04/18/2017 (Approximate)   SpO2 97%   BMI 31.38 kg/m   Assessment and Plan:  1. Right lower quadrant abdominal pain New onset.  Persistent.  Patient over the course of the last several months has pain that has gradually worsening over the past several weeks radiating to the right flank and involving the right pelvis area.  Patient has tenderness without guarding without rebound normal bowel sounds in the right lower quadrant.  There is no palpable mass.  Given the ongoing pain over several months that is gradually worsening we will obtain a CT of the abdomen and pelvis.  In the meantime we will obtain a CBC to look for leukocytosis. - CBC with Differential/Platelet - CT ABDOMEN PELVIS W WO CONTRAST  2. Pain in female pelvis New onset.  Persistent.  Over the course of several months pain is also been noted in the right flank and right pelvic area which is consistent with possible ovarian concerns.  We will proceed with CT of the abdomen and pelvis for evaluation of areas of concern.  In the meantime we will obtain a CBC to rule out leukocytosis - CBC with Differential/Platelet - CT  ABDOMEN PELVIS W WO CONTRAST  3. Generalized abdominal pain As noted generalized abdominal pain but more localized in the right lower quadrant right flank pelvic area requiring further evaluation at this time.  Patient does have mesh in this  area and she feels when she lays down that she feels the sensation of a fullness in her right lower quadrant right flank area. - CT ABDOMEN PELVIS W WO CONTRAST    Elizabeth Sauer, MD

## 2023-01-05 ENCOUNTER — Encounter: Payer: Self-pay | Admitting: Family Medicine

## 2023-01-05 ENCOUNTER — Other Ambulatory Visit: Payer: Self-pay

## 2023-01-05 DIAGNOSIS — R102 Pelvic and perineal pain: Secondary | ICD-10-CM

## 2023-01-05 DIAGNOSIS — R1031 Right lower quadrant pain: Secondary | ICD-10-CM

## 2023-01-05 DIAGNOSIS — R1084 Generalized abdominal pain: Secondary | ICD-10-CM

## 2023-01-05 LAB — CBC WITH DIFFERENTIAL/PLATELET
Basophils Absolute: 0 10*3/uL (ref 0.0–0.2)
Basos: 0 %
EOS (ABSOLUTE): 0.2 10*3/uL (ref 0.0–0.4)
Eos: 2 %
Hematocrit: 38 % (ref 34.0–46.6)
Hemoglobin: 12.7 g/dL (ref 11.1–15.9)
Immature Grans (Abs): 0 10*3/uL (ref 0.0–0.1)
Immature Granulocytes: 0 %
Lymphocytes Absolute: 2.3 10*3/uL (ref 0.7–3.1)
Lymphs: 32 %
MCH: 28.7 pg (ref 26.6–33.0)
MCHC: 33.4 g/dL (ref 31.5–35.7)
MCV: 86 fL (ref 79–97)
Monocytes Absolute: 0.6 10*3/uL (ref 0.1–0.9)
Monocytes: 8 %
Neutrophils Absolute: 4.1 10*3/uL (ref 1.4–7.0)
Neutrophils: 58 %
Platelets: 243 10*3/uL (ref 150–450)
RBC: 4.42 x10E6/uL (ref 3.77–5.28)
RDW: 12.8 % (ref 11.7–15.4)
WBC: 7.2 10*3/uL (ref 3.4–10.8)

## 2023-01-06 ENCOUNTER — Ambulatory Visit: Payer: BC Managed Care – PPO

## 2023-01-12 NOTE — Addendum Note (Signed)
Addended by: Jerald Kief on: 01/12/2023 08:33 AM   Modules accepted: Orders

## 2023-01-24 ENCOUNTER — Ambulatory Visit
Admission: RE | Admit: 2023-01-24 | Discharge: 2023-01-24 | Disposition: A | Discharge status: Home / Self Care | Payer: BC Managed Care – PPO | Source: Ambulatory Visit | Attending: Family Medicine | Admitting: Family Medicine

## 2023-01-24 DIAGNOSIS — R102 Pelvic and perineal pain: Secondary | ICD-10-CM | POA: Diagnosis present

## 2023-01-24 DIAGNOSIS — R1084 Generalized abdominal pain: Secondary | ICD-10-CM | POA: Insufficient documentation

## 2023-01-24 DIAGNOSIS — R1031 Right lower quadrant pain: Secondary | ICD-10-CM | POA: Insufficient documentation

## 2023-01-24 MED ORDER — IOHEXOL 300 MG/ML  SOLN
100.0000 mL | Freq: Once | INTRAMUSCULAR | Status: AC | PRN
  Administered 2023-01-24: 100 mL via INTRAVENOUS

## 2023-02-01 ENCOUNTER — Encounter: Payer: Self-pay | Admitting: Family Medicine

## 2023-02-08 ENCOUNTER — Other Ambulatory Visit: Payer: Self-pay

## 2023-02-08 DIAGNOSIS — R935 Abnormal findings on diagnostic imaging of other abdominal regions, including retroperitoneum: Secondary | ICD-10-CM

## 2023-02-08 DIAGNOSIS — R1031 Right lower quadrant pain: Secondary | ICD-10-CM

## 2023-02-08 NOTE — Progress Notes (Signed)
Referral placed.  KP 

## 2023-03-05 IMAGING — MG MM DIGITAL SCREENING BILAT W/ TOMO AND CAD
8 series · 8 of 24 positions shown · non-contrast
Comparison: Previous exam(s).

CLINICAL DATA: Screening.

EXAM:
DIGITAL SCREENING BILATERAL MAMMOGRAM WITH TOMOSYNTHESIS AND CAD
TECHNIQUE: Bilateral screening digital craniocaudal and mediolateral oblique
mammograms were obtained. Bilateral screening digital breast
tomosynthesis was performed. The images were evaluated with
computer-aided detection.

[R CC synth-2D]
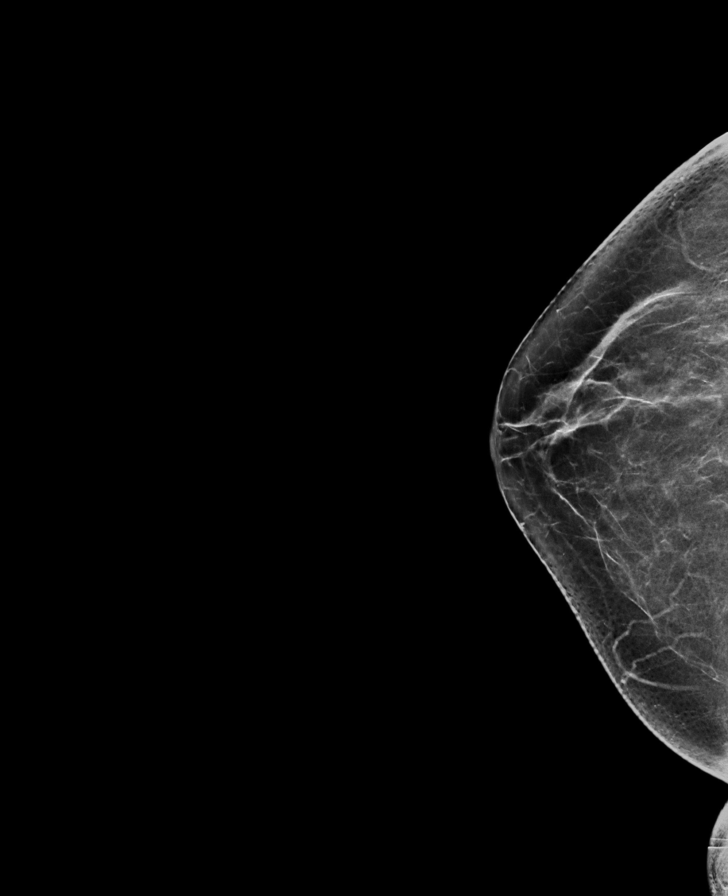

[L MLO synth-2D]
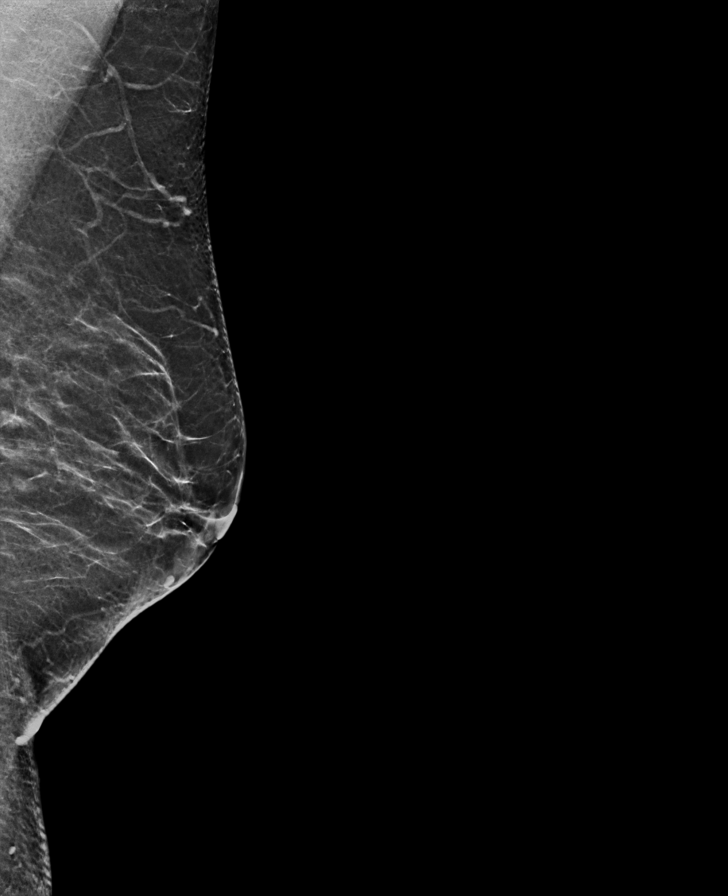

[R MLO synth-2D]
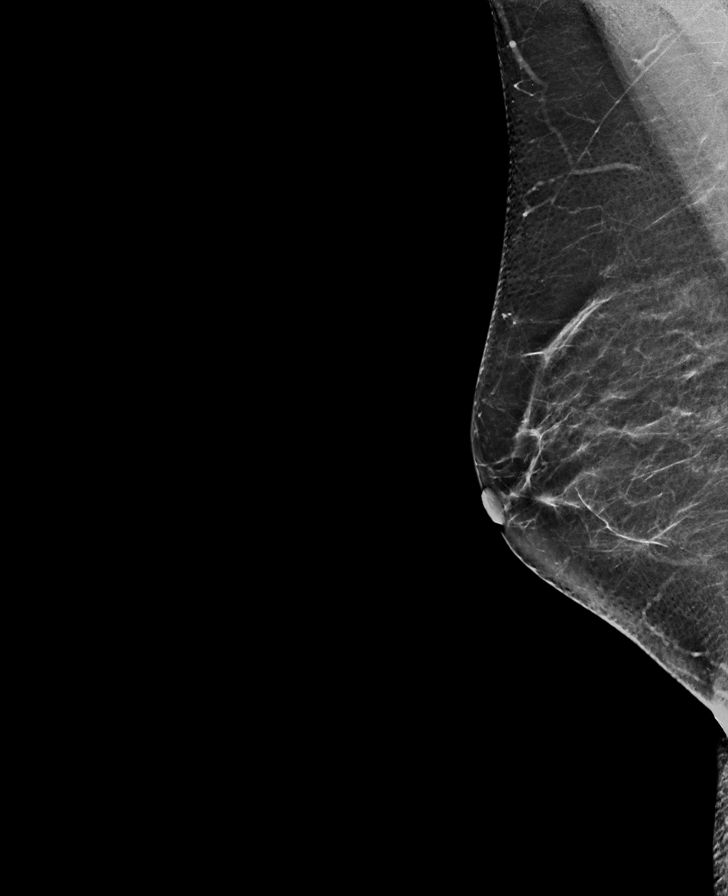

[L CC synth-2D]
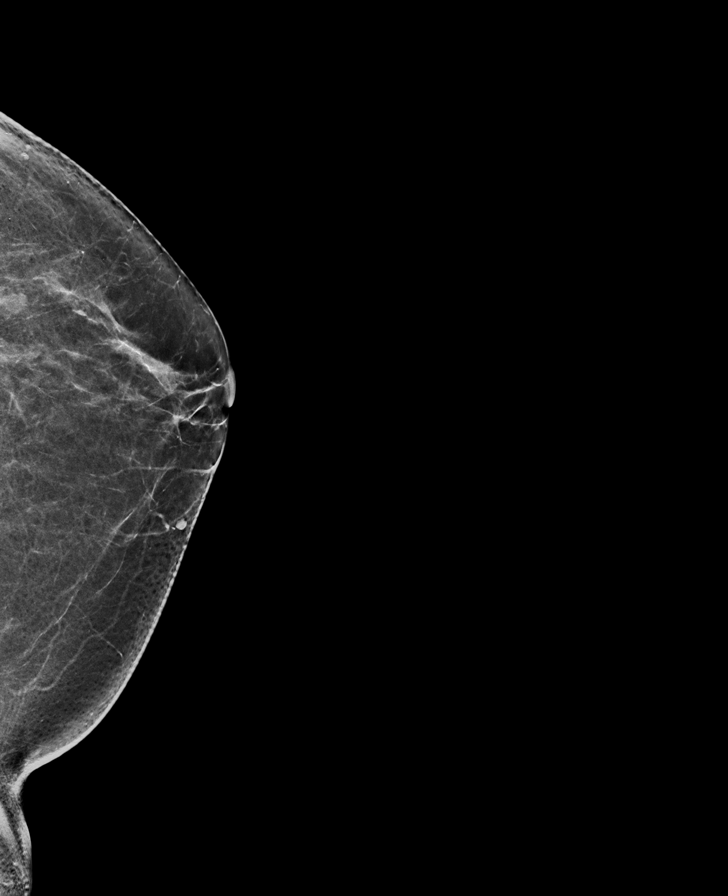

[L MLO tomo · tomo slice 33/64.0]
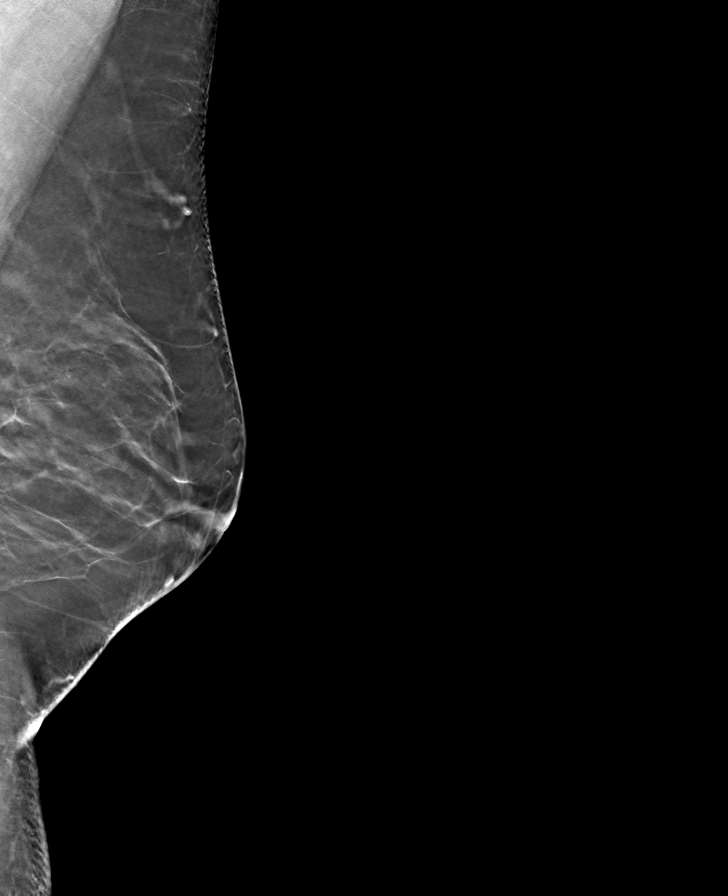

[R CC tomo · tomo slice 34/67.0]
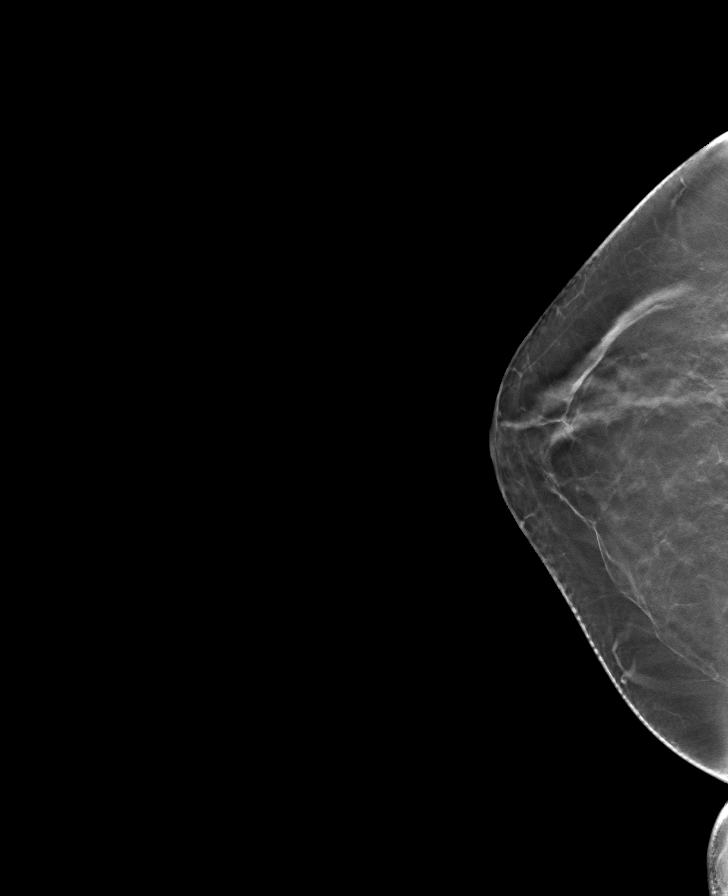

[L CC tomo · tomo slice 32/63.0]
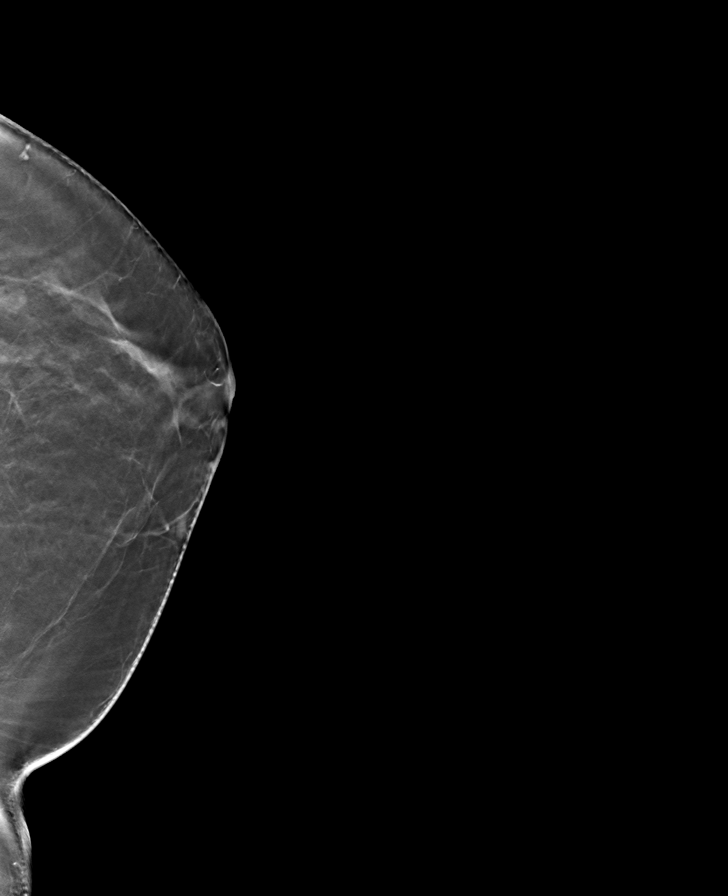

[R MLO tomo · tomo slice 35/68.0]
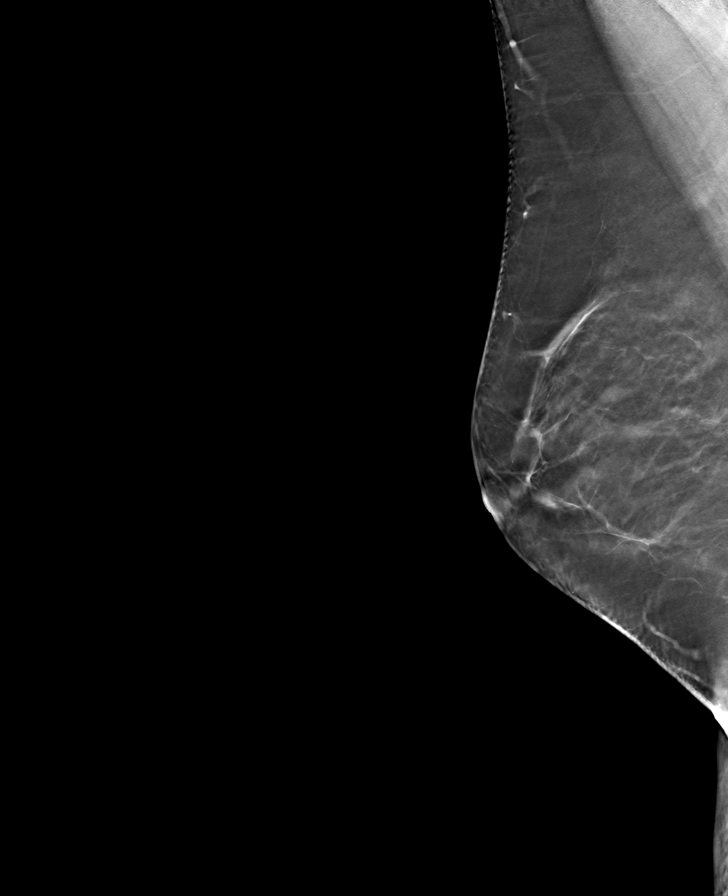

[8 of 24 positions shown; findings below may reference images not displayed]

ACR Breast Density Category b: There are scattered areas of
fibroglandular density.
FINDINGS: There are no findings suspicious for malignancy.
IMPRESSION: No mammographic evidence of malignancy. A result letter of this
screening mammogram will be mailed directly to the patient.

RECOMMENDATION:
Screening mammogram in one year. (Code:51-O-LD2)

BI-RADS CATEGORY  1: Negative.

## 2023-03-21 ENCOUNTER — Ambulatory Visit
Admission: RE | Admit: 2023-03-21 | Discharge: 2023-03-21 | Disposition: A | Discharge status: Home / Self Care | Payer: 59 | Attending: Family Medicine | Admitting: Family Medicine

## 2023-03-21 ENCOUNTER — Ambulatory Visit: Payer: 59 | Admitting: Family Medicine

## 2023-03-21 ENCOUNTER — Encounter: Payer: Self-pay | Admitting: Family Medicine

## 2023-03-21 ENCOUNTER — Ambulatory Visit: Payer: Self-pay | Admitting: *Deleted

## 2023-03-21 ENCOUNTER — Ambulatory Visit
Admission: RE | Admit: 2023-03-21 | Discharge: 2023-03-21 | Disposition: A | Discharge status: Home / Self Care | Payer: 59 | Source: Ambulatory Visit | Attending: Family Medicine | Admitting: Family Medicine

## 2023-03-21 VITALS — BP 124/68 | HR 65 | Ht 71.0 in | Wt 219.0 lb

## 2023-03-21 DIAGNOSIS — M25562 Pain in left knee: Secondary | ICD-10-CM

## 2023-03-21 NOTE — Telephone Encounter (Signed)
Reason for Disposition  [1] MODERATE pain (e.g., interferes with normal activities, limping) AND [2] present > 3 days  Answer Assessment - Initial Assessment Questions 1. LOCATION and RADIATION: "Where is the pain located?"      My left knee is hurting.   A couple of months ago I was stepping down stepping out of a chair and it's getting worse.  At night it's hurting a lot.  I can  still walk on it,   Going up and down steps it feels weak andpainful 2. QUALITY: "What does the pain feel like?"  (e.g., sharp, dull, aching, burning)     It hurts with twisting or bending.   I can't lay on my back and bend it.   It hurts behind my knee.   3. SEVERITY: "How bad is the pain?" "What does it keep you from doing?"   (Scale 1-10; or mild, moderate, severe)   -  MILD (1-3): doesn't interfere with normal activities    -  MODERATE (4-7): interferes with normal activities (e.g., work or school) or awakens from sleep, limping    -  SEVERE (8-10): excruciating pain, unable to do any normal activities, unable to walk     Moderate 4. ONSET: "When did the pain start?" "Does it come and go, or is it there all the time?"     A couple months ago when I stepped down off a step ladder. 5. RECURRENT: "Have you had this pain before?" If Yes, ask: "When, and what happened then?"     No 6. SETTING: "Has there been any recent work, exercise or other activity that involved that part of the body?"      See above 7. AGGRAVATING FACTORS: "What makes the knee pain worse?" (e.g., walking, climbing stairs, running)     It feels tight.   No swelling.      Steps and walking 8. ASSOCIATED SYMPTOMS: "Is there any swelling or redness of the knee?"     No 9. OTHER SYMPTOMS: "Do you have any other symptoms?" (e.g., chest pain, difficulty breathing, fever, calf pain)     No 10. PREGNANCY: "Is there any chance you are pregnant?" "When was your last menstrual period?"       N/A due to age  Protocols used: Knee Pain-A-AH

## 2023-03-21 NOTE — Telephone Encounter (Signed)
  Chief Complaint: Left knee pain for last couple of months Symptoms: Walking, twisting or bending Frequency: Since stepping off of a chair a couple of months ago Pertinent Negatives: Patient denies swelling Disposition: [] ED /[] Urgent Care (no appt availability in office) / [x] Appointment(In office/virtual)/ []  [facility]/ [] Home Care/ [] Refused Recommended Disposition /[] [facility]/ []  Follow-up with PCP Additional Notes: Appt made for today with Dr Yetta Barre for 4:00 at her request.

## 2023-03-21 NOTE — Progress Notes (Unsigned)
Date:  03/21/2023   Name:  Sherri Matthews   DOB:  1965/07/24   MRN:  [id number]   Chief Complaint: Knee Pain (Left knee. Patient said 2 months ago she was at her dad's house washing his sheers and was stepping down from a chair and knee was hurt then and its progressively getting worse. Patient said its painful when walking down or up stairs, and bending to sit and standing. )  Knee Pain  The incident occurred more than 1 week ago. The injury mechanism was a twisting injury (stepping down from kitchin chair). The pain is present in the left knee. The quality of the pain is described as aching. The pain is at a severity of 7/10 (hurts all the time/all day every day). The pain is moderate. The pain has been Constant since onset. Pertinent negatives include no inability to bear weight, loss of motion, loss of sensation, muscle weakness, numbness or tingling. The symptoms are aggravated by movement and palpation. She has tried NSAIDs and acetaminophen for the symptoms. The treatment provided moderate relief.    Lab Results  Component Value Date   NA 141 11/15/2022   K 4.0 11/15/2022   CO2 22 11/15/2022   GLUCOSE 87 11/15/2022   BUN 18 11/15/2022   CREATININE 0.78 11/15/2022   CALCIUM 9.5 11/15/2022   EGFR 87 11/15/2022   GFRNONAA >60 11/09/2021   Lab Results  Component Value Date   CHOL 190 04/23/2022   HDL 56 04/23/2022   LDLCALC 115 (H) 04/23/2022   TRIG 105 04/23/2022   CHOLHDL 4.6 (H) 02/24/2018   Lab Results  Component Value Date   TSH 1.170 11/15/2022   No results found for: "HGBA1C" Lab Results  Component Value Date   WBC 7.2 01/04/2023   HGB 12.7 01/04/2023   HCT 38.0 01/04/2023   MCV 86 01/04/2023   PLT 243 01/04/2023   Lab Results  Component Value Date   ALT 18 04/23/2022   AST 23 04/23/2022   ALKPHOS 118 04/23/2022   BILITOT 0.7 04/23/2022   No results found for: "25OHVITD2", "25OHVITD3", "VD25OH"   Review of Systems  Constitutional:  Negative for  chills, fatigue and fever.  Genitourinary:  Negative for decreased urine volume, difficulty urinating, dysuria, enuresis, flank pain, genital sores, hematuria, pelvic pain and urgency.  Neurological:  Negative for tingling, speech difficulty and numbness.    Patient Active Problem List   Diagnosis Date Noted   Tinea corporis 04/10/2021   Cervical spondylosis 11/20/2020   Segmental and somatic dysfunction of cervical region 11/20/2020   Obstructive sleep apnea 12/25/2019   Intractable migraine with aura without status migrainosus 06/21/2016   Chest pain 06/19/2016   Abnormal EKG 06/19/2016   Menopause 01/28/2016   Heart palpitations 10/09/2015   SOB (shortness of breath) 09/24/2015   Diverticulitis 06/04/2015   Lower back pain 06/04/2015   SVT (supraventricular tachycardia) (HCC) 06/04/2015   Essential hypertension 05/15/2015   Hyperlipidemia 05/15/2015   Gastritis    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     Vicodin [Hydrocodone-Acetaminophen] Palpitations  Dilaudid [Hydromorphone Hcl] Other (See Comments)   Fenofibrate Other (See Comments)    Myalgia    Past Surgical History:  Procedure Laterality Date   APPENDECTOMY  02/22/2006   BREAST BIOPSY Left 09/22/2021   u/s bx, mass 1:00, RIBBON clip-path pending   BREAST CYST ASPIRATION     CARDIAC CATHETERIZATION N/A 09/12/2015   Procedure: Left Heart Cath and Coronary Angiography;  Surgeon: Marcina Millard, MD;  Location: [facility];  Service:  Cardiovascular;  Laterality: N/A;   ESOPHAGOGASTRODUODENOSCOPY (EGD) WITH PROPOFOL N/A 04/18/2015   Procedure: ESOPHAGOGASTRODUODENOSCOPY (EGD) WITH PROPOFOL;  Surgeon: Midge Minium, MD;  Location: [facility];  Service: Endoscopy;  Laterality: N/A;   HERNIA REPAIR      Social History   Tobacco Use   Smoking status: Never   Smokeless tobacco: Never  Vaping Use   Vaping status: Never Used  Substance Use Topics   Alcohol use: Not Currently   Drug use: Never     Medication list has been reviewed and updated.  Current Meds  Medication Sig   ALPRAZolam (XANAX) 0.5 MG tablet TAKE ONE TABLET BY MOUTH ONCE DAILY AS NEEDED   amLODipine (NORVASC) 10 MG tablet Take 1 tablet (10 mg total) by mouth daily.   B Complex-C (SUPER B COMPLEX PO) Take 1 tablet by mouth daily.   BIOTIN PO Take 1 capsule by mouth daily.   IBUPROFEN PO Take by mouth.   Magnesium 250 MG TABS Take 1 tablet by mouth daily.   metoprolol tartrate (LOPRESSOR) 25 MG tablet Take 1/2 (one-half) tablet by mouth twice daily   omeprazole (PRILOSEC) 40 MG capsule TAKE 1 CAPSULE BY MOUTH TWICE DAILY.   simvastatin (ZOCOR) 40 MG tablet Take 1 tablet (40 mg total) by mouth daily.       03/21/2023    4:11 PM 11/15/2022    4:05 PM 07/09/2022    3:18 PM 04/23/2022   10:08 AM  GAD 7 : Generalized Anxiety Score  Nervous, Anxious, on Edge 0 0 0 0  Control/stop worrying 0 0 0 0  Worry too much - different things 0 0 0 0  Trouble relaxing 0 0 0 0  Restless 0 0 0 0  Easily annoyed or irritable 0 0 0 0  Afraid - awful might happen 0 0 0 0  Total GAD 7 Score 0 0 0 0  Anxiety Difficulty Not difficult at all Not difficult at all Not difficult at all Not difficult at all       03/21/2023    4:11 PM 11/15/2022    4:05 PM 07/09/2022    3:17 PM  Depression screen PHQ 2/9  Decreased Interest 0 0 0  Down, Depressed, Hopeless 0 0 0  PHQ - 2 Score 0 0 0  Altered sleeping 0 0 0  Tired, decreased energy 0 0 0  Change in appetite 0 0  0  Feeling bad or failure about yourself  0 0 0  Trouble concentrating 0 0 0  Moving slowly or fidgety/restless 0 0 0  Suicidal thoughts 0 0 0  PHQ-9 Score 0 0 0  Difficult doing work/chores Not difficult at all Not difficult at all Not difficult at all    BP Readings from Last 3 Encounters:  03/21/23 124/68  01/04/23 118/76  11/15/22 102/70    Physical Exam Vitals and nursing note reviewed.  Constitutional:      General: She is not in acute distress.    Appearance: She is not  diaphoretic.  HENT:     Head: Normocephalic and atraumatic.     Right Ear: Tympanic membrane and external ear normal.     Left Ear: Tympanic membrane and external ear normal.     Nose: Nose normal. No congestion or rhinorrhea.     Mouth/Throat:     Mouth: Mucous membranes are moist.     Pharynx: No oropharyngeal exudate or posterior oropharyngeal erythema.  Eyes:     General:        Right eye: No discharge.        Left eye: No discharge.     Conjunctiva/sclera: Conjunctivae normal.     Pupils: Pupils are equal, round, and reactive to light.  Neck:     Thyroid: No thyromegaly.     Vascular: No JVD.  Cardiovascular:     Rate and Rhythm: Normal rate and regular rhythm.     Heart sounds: Normal heart sounds. No murmur heard.    No friction rub. No gallop.  Pulmonary:     Effort: Pulmonary effort is normal.     Breath sounds: Normal breath sounds. No wheezing, rhonchi or rales.  Abdominal:     General: Bowel sounds are normal.     Palpations: Abdomen is soft. There is no mass.     Tenderness: There is no abdominal tenderness. There is no guarding.  Musculoskeletal:     Cervical back: Normal range of motion and neck supple.     Left knee: Decreased range of motion. Tenderness present over the medial joint line. MCL laxity present.     Comments: Extension block  Lymphadenopathy:     Cervical: No cervical adenopathy.  Skin:    General: Skin is warm and dry.  Neurological:     Mental Status: She  is alert.     Deep Tendon Reflexes: Reflexes are normal and symmetric.     Wt Readings from Last 3 Encounters:  03/21/23 219 lb (99.3 kg)  01/04/23 225 lb (102.1 kg)  11/15/22 220 lb (99.8 kg)    BP 124/68   Pulse 65   Ht 5\' 11"  (1.803 m)   Wt 219 lb (99.3 kg)   LMP 04/18/2017 (Approximate)   SpO2 99%   BMI 30.54 kg/m   Assessment and Plan:  1. Acute pain of left knee (Primary) New onset.  Persistent.  Stable.  Couple months ago patient was climbing out of a chair and twisted awkwardly and felt an immediate pain in her left knee on the medial aspect.  On examination there is tenderness at the medial joint line and some laxity of the MCL.  There is also pain with valgus stress.  There may be a chronic issue with the MCL however I believe that there is a possibility of a meniscal concern.  There is an effusion present that small which may represent the prior injury perhaps a bleed.  We will refer to sports medicine for evaluation thereupon.  In the meantime patient will continue with NSAIDs.  X-ray will be obtained for sports medicine for evaluation.  X-ray does show a small effusion and some decrease in the compartment to suggest some wear and tear of the cartilage.  This will be further addressed by sports medicine. - DG Knee 1-2 Views Left    Elizabeth Sauer, MD

## 2023-03-22 ENCOUNTER — Ambulatory Visit: Payer: 59 | Admitting: Family Medicine

## 2023-03-22 ENCOUNTER — Encounter: Payer: Self-pay | Admitting: Family Medicine

## 2023-03-22 VITALS — BP 116/70 | HR 65 | Ht 71.0 in | Wt 219.0 lb

## 2023-03-22 DIAGNOSIS — M1712 Unilateral primary osteoarthritis, left knee: Secondary | ICD-10-CM | POA: Diagnosis not present

## 2023-03-22 MED ORDER — MELOXICAM 15 MG PO TABS: 15.0000 mg | ORAL_TABLET | Freq: Every day | ORAL | 0 refills | Status: AC

## 2023-03-28 DIAGNOSIS — M1712 Unilateral primary osteoarthritis, left knee: Secondary | ICD-10-CM | POA: Insufficient documentation

## 2023-03-28 NOTE — Progress Notes (Signed)
Primary Care / Sports Medicine Office Visit  Patient Information:  Patient ID: Sherri Matthews, female DOB: 03/16/1965 Age: 58 y.o. MRN: [id number]   [place] is a pleasant 58 y.o. female presenting with the following:  Chief Complaint  Patient presents with   Knee Pain    Left knee pain x5 months , stepping down from a chair and twisted knee in August 2024. Pain has gotten worse. Patient has been taking ibuprofen and using heating pad for pian. She has some swelling and with bending her knee is tight feeling.     Vitals:   03/22/23 1633  BP: 116/70  Pulse: 65  SpO2: 97%   Vitals:   03/22/23 1633  Weight: 219 lb (99.3 kg)  Height: 5\' 11"  (1.803 m)   Body mass index is 30.54 kg/m.   Independent interpretation of notes and tests performed by another provider:   Independent interpretation of x-ray images and report conducted with patient, see below for details.  Procedures performed:   None  Pertinent History, Exam, Impression, and Recommendations:   Problem List Items Addressed This Visit     Osteoarthritis of left patellofemoral joint - Primary   History of Present Illness The patient presents with left knee pain following an injury in August 2024.She experienced left knee pain after stepping on and off a kitchen chair repeatedly while hanging curtains. The pain initially localized to the anterior knee, particularly during flexion, and has progressively worsened.  The pain is aggravated by activities such as ambulation, rising from a seated position, and stair use. She describes a sensation of tightness and occasional instability, particularly during rotational movements. No significant swelling is noted, but there is tightness and pain posteriorly. She has no history of prior knee issues.  She has been using ibuprofen, 600-800 mg nightly, and a heating pad, but these have not provided significant relief. An x-ray was performed recently, but no other  treatments or interventions have been initiated.  Physical Exam MUSCULOSKELETAL: Left knee shows trace to 1+ effusion, no abnormalities on inspection, tenderness at lateral patellar facet, non-tender medial patellar facet, range of motion from zero to 100 degrees limited by pain localizing anteriorly, negative anterior and posterior drawer tests, negative valgus/varus testing, non tender medial and lateral joint lines, negative medial and lateral McMurray.  Results RADIOLOGY Left knee X-ray: Mild to moderate degenerative changes at the patella, small osteophyte, no significant arthritis, preserved joint space (03/21/2023)  Assessment and Plan Left Knee Pain -in the setting of patellofemoral osteoarthritis related arthralgia Pain began in August 2023 after a fall, with worsening symptoms despite use of ibuprofen. Pain is primarily in the front of the knee, with some pain behind the knee. X-ray shows mild to moderate degenerative changes at the kneecap and inner aspect of the knee. There is a small bone spur on the kneecap. Examination reveals tenderness at the lateral patellar facet, limited range of motion due to pain, and a trace to 1+ effusion. -Start Meloxicam, one daily with food for two weeks, then as needed. -Avoid other NSAIDs while on Meloxicam (ibuprofen, naproxen). -Contact the office in two to four weeks to report progress. -If no improvement, consider cortisone injection or MRI. -Avoid activities that aggravate the knee, such as twisting and turning, and minimize flexion and extension movements. -Consider home-based rehab if pain improves with medication.      Relevant Medications   meloxicam (MOBIC) 15 MG tablet     Orders & Medications Medications:  Meds ordered this encounter  Medications   meloxicam (MOBIC) 15 MG tablet    Sig: Take 1 tablet (15 mg total) by mouth daily. X 2 weeks then daily PRN    Dispense:  30 tablet    Refill:  0   No orders of the defined types  were placed in this encounter.    No follow-ups on file.     Jerrol Banana, MD, [facility]   Primary Care Sports Medicine Primary Care and Sports Medicine at [facility]

## 2023-03-28 NOTE — Assessment & Plan Note (Signed)
History of Present Illness The patient presents with left knee pain following an injury in August 2024.She experienced left knee pain after stepping on and off a kitchen chair repeatedly while hanging curtains. The pain initially localized to the anterior knee, particularly during flexion, and has progressively worsened.  The pain is aggravated by activities such as ambulation, rising from a seated position, and stair use. She describes a sensation of tightness and occasional instability, particularly during rotational movements. No significant swelling is noted, but there is tightness and pain posteriorly. She has no history of prior knee issues.  She has been using ibuprofen, 600-800 mg nightly, and a heating pad, but these have not provided significant relief. An x-ray was performed recently, but no other treatments or interventions have been initiated.  Physical Exam MUSCULOSKELETAL: Left knee shows trace to 1+ effusion, no abnormalities on inspection, tenderness at lateral patellar facet, non-tender medial patellar facet, range of motion from zero to 100 degrees limited by pain localizing anteriorly, negative anterior and posterior drawer tests, negative valgus/varus testing, non tender medial and lateral joint lines, negative medial and lateral McMurray.  Results RADIOLOGY Left knee X-ray: Mild to moderate degenerative changes at the patella, small osteophyte, no significant arthritis, preserved joint space (03/21/2023)  Assessment and Plan Left Knee Pain -in the setting of patellofemoral osteoarthritis related arthralgia Pain began in August 2023 after a fall, with worsening symptoms despite use of ibuprofen. Pain is primarily in the front of the knee, with some pain behind the knee. X-ray shows mild to moderate degenerative changes at the kneecap and inner aspect of the knee. There is a small bone spur on the kneecap. Examination reveals tenderness at the lateral patellar facet, limited range  of motion due to pain, and a trace to 1+ effusion. -Start Meloxicam, one daily with food for two weeks, then as needed. -Avoid other NSAIDs while on Meloxicam (ibuprofen, naproxen). -Contact the office in two to four weeks to report progress. -If no improvement, consider cortisone injection or MRI. -Avoid activities that aggravate the knee, such as twisting and turning, and minimize flexion and extension movements. -Consider home-based rehab if pain improves with medication.

## 2023-03-28 NOTE — Patient Instructions (Signed)
Next Steps for Left Knee Pain:  1. Start Meloxicam once daily with food for two weeks, then use as needed. 2. Avoid using other NSAIDs (such as ibuprofen or naproxen) while taking Meloxicam. 3. Contact the office in two to four weeks to report progress. 4. Consider cortisone injection or MRI if no improvement is noted. 5. Avoid activities that aggravate the knee, such as twisting, turning, and excessive flexion and extension. 6. Consider starting a home-based rehabilitation program if pain improves with medication.

## 2023-04-17 ENCOUNTER — Encounter: Payer: Self-pay | Admitting: Emergency Medicine

## 2023-04-17 ENCOUNTER — Ambulatory Visit
Admission: EM | Admit: 2023-04-17 | Discharge: 2023-04-17 | Disposition: A | Discharge status: Home / Self Care | Payer: 59 | Attending: Family Medicine | Admitting: Family Medicine

## 2023-04-17 ENCOUNTER — Ambulatory Visit (INDEPENDENT_AMBULATORY_CARE_PROVIDER_SITE_OTHER): Payer: 59

## 2023-04-17 DIAGNOSIS — J989 Respiratory disorder, unspecified: Secondary | ICD-10-CM | POA: Diagnosis present

## 2023-04-17 DIAGNOSIS — R509 Fever, unspecified: Secondary | ICD-10-CM

## 2023-04-17 DIAGNOSIS — J069 Acute upper respiratory infection, unspecified: Secondary | ICD-10-CM | POA: Insufficient documentation

## 2023-04-17 LAB — RESP PANEL BY RT-PCR (FLU A&B, COVID) ARPGX2
Influenza A by PCR: NEGATIVE
Influenza B by PCR: NEGATIVE
SARS Coronavirus 2 by RT PCR: NEGATIVE

## 2023-04-17 MED ORDER — ONDANSETRON 4 MG PO TBDP: 4.0000 mg | ORAL_TABLET | Freq: Three times a day (TID) | ORAL | 0 refills | Status: AC | PRN

## 2023-04-17 MED ORDER — IBUPROFEN 800 MG PO TABS
800.0000 mg | ORAL_TABLET | Freq: Once | ORAL | Status: AC
  Administered 2023-04-17: 800 mg via ORAL

## 2023-04-17 MED ORDER — PROMETHAZINE-DM 6.25-15 MG/5ML PO SYRP: 5.0000 mL | ORAL_SOLUTION | Freq: Four times a day (QID) | ORAL | 0 refills | Status: DC | PRN

## 2023-04-17 NOTE — Discharge Instructions (Addendum)
 Your chest xray did not show evidence of pneumonia or bronchitis. Your COVID and influenza tests are negative.  Stop by the pharmacy to pick up your prescriptions.  Follow up with your primary care provider or return to the urgent care, if not improving.    You can take Tylenol and/or Ibuprofen as needed for fever reduction and pain relief.    For cough: Stop at the pharmacy to pick up your prescription cough medication.. You can use a humidifier for chest congestion and cough.  If you don't have a humidifier, you can sit in the bathroom with the hot shower running.      For sore throat: try warm salt water gargles, Mucinex sore throat cough drops or cepacol lozenges, throat spray, warm tea or water with lemon/honey, popsicles or ice, or OTC cold relief medicine for throat discomfort. You can also purchase chloraseptic spray at the pharmacy or dollar store.   For congestion: take a daily anti-histamine like Zyrtec, Claritin, and a oral decongestant, such as pseudoephedrine.  You can also use Flonase 1-2 sprays in each nostril daily. Afrin is also a good option, if you do not have high blood pressure.    It is important to stay hydrated: drink plenty of fluids (water, gatorade/powerade/pedialyte, juices, or teas) to keep your throat moisturized and help further relieve irritation/discomfort.    Return or go to the Emergency Department if symptoms worsen or do not improve in the next few days

## 2023-04-17 NOTE — ED Provider Notes (Signed)
 [facility]    CSN: [id number] Arrival date & time: 04/17/23  1124      History   Chief Complaint Chief Complaint  Patient presents with   Cough   Headache    HPI Sherri Matthews is a 58 y.o. female.   HPI  History obtained from the patient. Sherri Matthews presents for fatigue, dry cough, headache, fever that started on Friday morning.  Tmax 100.8 F. Has been taking Tylenol and ibuprofen.  Took tylenol last at 7 AM.  When she lays down she heard crackling. She has been nauseous, increased soft non-watery stools and body aches.  Denies sore throat.   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   No history of asthma. Denies smoking and vaping.      Past Medical History:  Diagnosis Date   Anxiety    AV node dysfunction    tachycardia sees Dr. Lady Gary   Cervical disc disease    degenerative disk    Chronic abdominal pain    Heart murmur    Hyperlipidemia    Hypertension    IBS (irritable bowel syndrome)    Mitral valve prolapse    Palpitations    Sleep apnea    possible but not tested    Patient Active Problem List   Diagnosis Date Noted   Osteoarthritis of left patellofemoral joint 03/28/2023   Tinea corporis 04/10/2021   Cervical spondylosis 11/20/2020   Segmental and somatic dysfunction of cervical region 11/20/2020   Obstructive sleep apnea 12/25/2019   Intractable migraine with aura without status migrainosus 06/21/2016   Chest pain 06/19/2016   Abnormal EKG 06/19/2016   Menopause 01/28/2016   Heart palpitations 10/09/2015   SOB (shortness of breath) 09/24/2015   Diverticulitis 06/04/2015   Lower back pain 06/04/2015   SVT (supraventricular tachycardia) (HCC) 06/04/2015   Essential hypertension 05/15/2015   Hyperlipidemia 05/15/2015   Gastritis    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   Anxiety disorder due to known physiological condition 07/16/2014   DDD (degenerative disc disease),  lumbosacral 07/16/2014   Heart murmur 07/16/2014   Gastroesophageal reflux disease 07/16/2014   H/O: osteoarthritis 07/16/2014    Past Surgical History:  Procedure Laterality Date   APPENDECTOMY  02/22/2006   BREAST BIOPSY Left 09/22/2021   u/s bx, mass 1:00, RIBBON clip-path pending   BREAST CYST ASPIRATION     CARDIAC CATHETERIZATION N/A 09/12/2015   Procedure: Left Heart Cath and Coronary Angiography;  Surgeon: Marcina Millard, MD;  Location: [facility];  Service: Cardiovascular;  Laterality: N/A;   ESOPHAGOGASTRODUODENOSCOPY (EGD) WITH PROPOFOL N/A 04/18/2015   Procedure: ESOPHAGOGASTRODUODENOSCOPY (EGD) WITH PROPOFOL;  Surgeon: Midge Minium, MD;  Location: [facility];  Service: Endoscopy;  Laterality: N/A;   HERNIA REPAIR      OB History   No obstetric history on file.      Home Medications    Prior to Admission medications   Medication Sig Start Date End Date Taking? Authorizing Provider  ondansetron (ZOFRAN-ODT) 4 MG disintegrating tablet Take 1 tablet (4 mg total) by mouth every 8 (eight) hours as needed. 04/17/23  Yes Shalicia Craghead, DO  promethazine-dextromethorphan (PROMETHAZINE-DM) 6.25-15 MG/5ML syrup Take 5 mLs by mouth 4 (four) times daily as needed. 04/17/23  Yes Annalea Alguire, DO  ALPRAZolam (XANAX) 0.5 MG tablet TAKE ONE TABLET BY MOUTH ONCE DAILY AS NEEDED 11/15/22   Duanne Limerick, MD  amLODipine (NORVASC) 10 MG tablet Take 1  tablet (10 mg total) by mouth daily. 11/15/22   Duanne Limerick, MD  B Complex-C (SUPER B COMPLEX PO) Take 1 tablet by mouth daily.    [provider]  BIOTIN PO Take 1 capsule by mouth daily.    [provider]  IBUPROFEN PO Take by mouth.    [provider]  Magnesium 250 MG TABS Take 1 tablet by mouth daily.    [provider]  meloxicam (MOBIC) 15 MG tablet Take 1 tablet (15 mg total) by mouth daily. X 2 weeks then daily PRN 03/22/23   Jerrol Banana, MD  metoprolol tartrate  (LOPRESSOR) 25 MG tablet Take 1/2 (one-half) tablet by mouth twice daily 11/15/22   Duanne Limerick, MD  omeprazole (PRILOSEC) 40 MG capsule TAKE 1 CAPSULE BY MOUTH TWICE DAILY. 11/15/22   Duanne Limerick, MD  simvastatin (ZOCOR) 40 MG tablet Take 1 tablet (40 mg total) by mouth daily. 11/15/22   Duanne Limerick, MD    Family History Family History  Problem Relation Age of Onset   Atrial fibrillation Mother    Hypertension Mother    Diabetes Mother    Atrial fibrillation Father    Breast cancer Maternal Aunt        mat great aunt    Social History Social History   Tobacco Use   Smoking status: Never   Smokeless tobacco: Never  Vaping Use   Vaping status: Never Used  Substance Use Topics   Alcohol use: Not Currently   Drug use: Never     Allergies   Ciprofloxacin, Oxycodone-acetaminophen, Oxycodone-acetaminophen, Sertraline, Vicodin [hydrocodone-acetaminophen], Dilaudid [hydromorphone hcl], and Fenofibrate   Review of Systems Review of Systems: negative unless otherwise stated in HPI.      Physical Exam Triage Vital Signs ED Triage Vitals  Encounter Vitals Group     BP 04/17/23 1138 123/79     Systolic BP Percentile --      Diastolic BP Percentile --      Pulse Rate 04/17/23 1138 80     Resp 04/17/23 1138 14     Temp 04/17/23 1138 (!) 101.7 F (38.7 C)     Temp Source 04/17/23 1138 Oral     SpO2 04/17/23 1138 95 %     Weight 04/17/23 1137 218 lb 14.7 oz (99.3 kg)     Height 04/17/23 1137 5\' 11"  (1.803 m)     Head Circumference --      Peak Flow --      Pain Score 04/17/23 1137 3     Pain Loc --      Pain Education --      Exclude from Growth Chart --    No data found.  Updated Vital Signs BP 123/79 (BP Location: Left Arm)   Pulse 80   Temp (!) 101.7 F (38.7 C) (Oral)   Resp 14   Ht 5\' 11"  (1.803 m)   Wt 99.3 kg   LMP 04/18/2017 (Approximate)   SpO2 95%   BMI 30.53 kg/m   Visual Acuity Right Eye Distance:   Left Eye Distance:   Bilateral  Distance:    Right Eye Near:   Left Eye Near:    Bilateral Near:     Physical Exam GEN:     alert, non-toxic appearing female in no distress    HENT:  mucus membranes moist, oropharyngeal without lesions or erythema, no tonsillar hypertrophy or exudates, clear nasal discharge EYES:   pupils equal and reactive,  no scleral injection or discharge NECK:  normal ROM, no meningismus   RESP:  no increased work of breathing, scattered rhonchi but no rales or wheezing CVS:   regular rate and rhythm Skin:   warm and dry    UC Treatments / Results  Labs (all labs ordered are listed, but only abnormal results are displayed) Labs Reviewed  RESP PANEL BY RT-PCR (FLU A&B, COVID) [id number]    EKG   Radiology DG Chest 2 View Result Date: 04/17/2023 CLINICAL DATA:  Cough, fever EXAM: CHEST - 2 VIEW COMPARISON:  06/03/2022, 11/09/2021 FINDINGS: The heart size and mediastinal contours are within normal limits. Small site of probable scarring in the right suprahilar region is unchanged since 2023. Both lungs are otherwise clear. No acute osseous abnormality. IMPRESSION: No active cardiopulmonary disease. Electronically Signed   By: Duanne Guess D.O.   On: 04/17/2023 12:26    Procedures Procedures (including critical care time)  Medications Ordered in UC Medications  ibuprofen (ADVIL) tablet 800 mg (800 mg Oral Given 04/17/23 1140)    Initial Impression / Assessment and Plan / UC Course  I have reviewed the triage vital signs and the nursing notes.  Pertinent labs & imaging results that were available during my care of the patient were reviewed by me and considered in my medical decision making (see chart for details).       Pt is a 58 y.o. female who presents for 2 days of respiratory symptoms. Odeth is febrile here at 101.52F. Given 800 mg Motrin. Satting well on room air. Overall pt is non-toxic appearing, well hydrated, without respiratory distress. Pulmonary exam is remarkable for  rhonchi.  COVID and influenza panel obtained and was negative. Chest xray recommended and she is agreeable.   Chest xray personally reviewed by me without focal pneumonia, pleural effusion, cardiomegaly or pneumothorax. Radiologist impression reviewed. Suspect viral respiratory illness. Discussed symptomatic treatment. Promethazine DM for cough.  Explained lack of efficacy of antibiotics in viral disease.  Typical duration of symptoms discussed. Zofran for nausea.  Work note provided.   Return and ED precautions given and voiced understanding. Discussed MDM, treatment plan and plan for follow-up with patient who agrees with plan.     Final Clinical Impressions(s) / UC Diagnoses   Final diagnoses:  Respiratory illness with fever  Viral URI with cough     Discharge Instructions      Your chest xray did not show evidence of pneumonia or bronchitis. Your COVID and influenza tests are negative.  Stop by the pharmacy to pick up your prescriptions.  Follow up with your primary care provider or return to the urgent care, if not improving.    You can take Tylenol and/or Ibuprofen as needed for fever reduction and pain relief.    For cough: Stop at the pharmacy to pick up your prescription cough medication.. You can use a humidifier for chest congestion and cough.  If you don't have a humidifier, you can sit in the bathroom with the hot shower running.      For sore throat: try warm salt water gargles, Mucinex sore throat cough drops or cepacol lozenges, throat spray, warm tea or water with lemon/honey, popsicles or ice, or OTC cold relief medicine for throat discomfort. You can also purchase chloraseptic spray at the pharmacy or dollar store.   For congestion: take a daily anti-histamine like Zyrtec, Claritin, and a oral decongestant, such as pseudoephedrine.  You can also use Flonase 1-2 sprays in each nostril  daily. Afrin is also a good option, if you do not have high blood pressure.    It is  important to stay hydrated: drink plenty of fluids (water, gatorade/powerade/pedialyte, juices, or teas) to keep your throat moisturized and help further relieve irritation/discomfort.    Return or go to the Emergency Department if symptoms worsen or do not improve in the next few days         ED Prescriptions     Medication Sig Dispense Auth. Provider   ondansetron (ZOFRAN-ODT) 4 MG disintegrating tablet Take 1 tablet (4 mg total) by mouth every 8 (eight) hours as needed. 20 tablet Iyah Laguna, DO   promethazine-dextromethorphan (PROMETHAZINE-DM) 6.25-15 MG/5ML syrup Take 5 mLs by mouth 4 (four) times daily as needed. 118 mL Katha Cabal, DO      PDMP not reviewed this encounter.   Katha Cabal, DO 04/17/23 1252

## 2023-04-17 NOTE — ED Triage Notes (Signed)
 Patient c/o cough, congestion, headaches and bodyaches that started on Friday.  Patient reports fevers Friday night.

## 2023-05-13 ENCOUNTER — Other Ambulatory Visit: Payer: Self-pay | Admitting: Internal Medicine

## 2023-05-13 DIAGNOSIS — R221 Localized swelling, mass and lump, neck: Secondary | ICD-10-CM

## 2023-05-18 ENCOUNTER — Ambulatory Visit
Admission: RE | Admit: 2023-05-18 | Discharge: 2023-05-18 | Disposition: A | Discharge status: Home / Self Care | Source: Ambulatory Visit | Attending: Internal Medicine | Admitting: Internal Medicine

## 2023-05-18 DIAGNOSIS — R221 Localized swelling, mass and lump, neck: Secondary | ICD-10-CM

## 2023-05-30 ENCOUNTER — Encounter: Payer: Self-pay | Admitting: Family Medicine

## 2023-05-31 NOTE — Telephone Encounter (Signed)
 Please review.  KP

## 2023-06-02 ENCOUNTER — Ambulatory Visit: Payer: Self-pay

## 2023-06-02 ENCOUNTER — Ambulatory Visit: Admitting: Family Medicine

## 2023-06-02 ENCOUNTER — Other Ambulatory Visit (INDEPENDENT_AMBULATORY_CARE_PROVIDER_SITE_OTHER): Payer: Self-pay | Admitting: Radiology

## 2023-06-02 ENCOUNTER — Encounter: Payer: Self-pay | Admitting: Family Medicine

## 2023-06-02 VITALS — BP 136/86 | HR 64 | Resp 16 | Ht 71.0 in | Wt 216.0 lb

## 2023-06-02 DIAGNOSIS — M1712 Unilateral primary osteoarthritis, left knee: Secondary | ICD-10-CM

## 2023-06-02 DIAGNOSIS — M2391 Unspecified internal derangement of right knee: Secondary | ICD-10-CM

## 2023-06-02 MED ORDER — CELECOXIB 200 MG PO CAPS: 200.0000 mg | ORAL_CAPSULE | Freq: Two times a day (BID) | ORAL | 0 refills | Status: DC | PRN

## 2023-06-02 NOTE — Assessment & Plan Note (Addendum)
 History of Present Illness Sherri Matthews is a 58 year old female with knee arthritis who presents with severe knee pain and limited mobility.  She experiences severe pain in both knees, with the right knee being more symptomatic. The pain is located in the back and front of the knee, radiating down into her shins, and is described as feeling like her bones are 'about to explode'. 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 Swelling in her feet and legs is also noted. No complete locking of the knees is reported, but they sometimes feel as though they might lock when turning.  She has a history of arthritis in her knees, confirmed by previous x-rays. Meloxicam was tried in the past without sufficient relief. Currently, she takes Tylenol and ibuprofen frequently, which causes gastrointestinal discomfort. Despite these treatments, she has not experienced significant relief since her last visit and continues to have significant inflammation and pain.  She is on a beta blocker, taking half a tablet twice a day, and monitors her heart rate at home. She has a history of reactions to anesthesia and cortisone shots, which causes concern about potential side effects.  Her vitamin D level was noted to be low at 15 ng/mL, and she is currently on a regimen of 50,000 IU of vitamin D, taking her fourth dose today. She has been prescribed a course of eight doses with several refills available.  Physical Exam INSPECTION: Swelling on the right knee. PALPATION: Questionable effusion trace on the right knee, negative on the left. Nontender right lateral and medial patellar facets. Nontender lateral and medial patellar facets on the left knee. Nontender medial and lateral joint lines on both knees. RANGE OF MOTION: Right knee ROM from 0 to 90 degrees with pain localized  anteriorly and posteriorly. Left knee ROM from 0 to 110 degrees, limited by pain. SPECIAL TESTS: Negative anterior and posterior drawer tests on both knees. Negative valgus and varus stress tests on the right knee. Positive McMurray test on the right knee. Negative McMurray test on the left knee. Valgus stress test elicits medial pain, no laxity on the left knee. Negative varus stress test on the left knee.  Results LABS Vitamin D: 15 ng/mL  Assessment and Plan Knee Osteoarthritis Arthralgia bilaterally. Chronic knee pain with inflammation and limited range of motion, particularly in the right knee. X-ray with known left knee  osteoarthritis. Positive McMurray test suggests possible meniscal involvement on right. Significant functional limitations due to pain. Informed that cortisone injections provide temporary symptom relief but do not cure arthritis. - Administer cortisone injection under ultrasound guidance to both knees. - Fit with a hinged knee brace for the right knee. - Prescribe Celebrex to take twice daily until symptoms respond to cortisone (take with food). - Order x-ray of the right knee. - Consider MRI if symptoms do not improve in 2-4 weeks or if knee instability persists. - Advise to monitor heart rate and adjust beta blocker dosage if heart rate exceeds 100 bpm. - Encourage use of Xanax for anxiety related to treatment, ensuring safe use when driving.

## 2023-06-02 NOTE — Telephone Encounter (Signed)
 Reason for CRM: Pharmacy called need clarification regarding instructions for celecoxib (CELEBREX) 200 MG capsule; Take 1 capsule (200 mg total) by mouth 2 (two) times daily as needed. One to 2 tablets by mouth daily as needed for pain.Please call Phone: [phone]    Copied from CRM [id number]. Topic: Clinical - Prescription Issue >> Jun 02, 2023  5:03 PM Eunice Blase wrote: Reason for CRM: Pharmacy called need clarification regarding instructions for celecoxib (CELEBREX) 200 MG capsule; Take 1 capsule (200 mg total) by mouth 2 (two) times daily as needed. One to 2 tablets by mouth daily as needed for pain.Please call Phone: [phone] Answer Assessment - Initial Assessment Questions 1. REASON FOR CALL or QUESTION: "What is your reason for calling today?" or "How can I best help you?" or "What question do you have that I can help answer?"     Please see notes 2. CALLER: Document the source of call. (e.g., laboratory, patient).     Pharmacist  Protocols used: PCP Call - No Triage-A-AH

## 2023-06-03 ENCOUNTER — Other Ambulatory Visit: Payer: Self-pay

## 2023-06-03 MED ORDER — CELECOXIB 200 MG PO CAPS: 200.0000 mg | ORAL_CAPSULE | Freq: Two times a day (BID) | ORAL | 0 refills | Status: AC | PRN

## 2023-06-03 MED ORDER — TRIAMCINOLONE ACETONIDE 40 MG/ML IJ SUSP
80.0000 mg | Freq: Once | INTRAMUSCULAR | Status: AC
  Administered 2023-06-02: 80 mg via INTRAMUSCULAR

## 2023-06-03 NOTE — Telephone Encounter (Signed)
Refill changed.  KP

## 2023-06-06 NOTE — Telephone Encounter (Signed)
 Please review,  KP

## 2023-06-07 ENCOUNTER — Ambulatory Visit
Admission: RE | Admit: 2023-06-07 | Discharge: 2023-06-07 | Disposition: A | Discharge status: Home / Self Care | Source: Ambulatory Visit | Attending: Family Medicine | Admitting: Family Medicine

## 2023-06-07 ENCOUNTER — Ambulatory Visit
Admission: RE | Admit: 2023-06-07 | Discharge: 2023-06-07 | Disposition: A | Discharge status: Home / Self Care | Attending: Family Medicine | Admitting: Family Medicine

## 2023-06-07 DIAGNOSIS — M2391 Unspecified internal derangement of right knee: Secondary | ICD-10-CM

## 2023-06-07 NOTE — Patient Instructions (Signed)
 You have just been given a cortisone injection to reduce pain and inflammation. After the injection you may notice immediate relief of pain as a result of the Lidocaine. It is important to rest the area of the injection for 24 to 48 hours after the injection. There is a possibility of some temporary increased discomfort and swelling for up to 72 hours until the cortisone begins to work. If you do have pain, simply rest the joint and use ice. If you can tolerate over the counter medications, you can try Tylenol, Aleve, or Advil for added relief per package instructions.   Patient Plan for Post-Visit Guidance  1. Knee Pain Management:    - You received cortisone injections in both knees under ultrasound guidance.    - Use a hinged knee brace on your right knee for support.    - Take Celebrex twice daily with food until your symptoms improve.  2. Monitoring and Follow-Up:    - Get an x-ray of your right knee as ordered.    - If symptoms do not improve in 2-4 weeks or if the knee feels unstable, consider further evaluation with an MRI.  Contact us  to coordinate this next steps.    - Monitor your heart rate and adjust your beta blocker dosage if your heart rate exceeds 100 bpm.  3. Anxiety Management:    - Use Xanax as needed for anxiety related to your treatment, but ensure you use it safely, especially when driving.  Please follow these steps and contact us  if you have any questions or concerns.

## 2023-06-07 NOTE — Progress Notes (Signed)
 Primary Care / Sports Medicine Office Visit  Patient Information:  Patient ID: Sherri Matthews, female DOB: 02-09-1966 Age: 58 y.o. MRN: [id number]   Sherri Matthews is a pleasant 58 y.o. female presenting with the following:  No chief complaint on file.   Vitals:   06/02/23 1533  BP: 136/86  Pulse: 64  Resp: 16   Vitals:   06/02/23 1533  Weight: 216 lb (98 kg)  Height: 5\' 11"  (1.803 m)   Body mass index is 30.13 kg/m.     Independent interpretation of notes and tests performed by another provider:   None  Procedures performed:   Procedure:  Injection of right knee under ultrasound guidance. Ultrasound guidance utilized for anteromedial approach, no effusion noted Samsung HS60 device utilized with permanent recording / reporting. Verbal informed consent obtained and verified. Skin prepped in a sterile fashion. Ethyl chloride for topical local analgesia.  Completed without difficulty and tolerated well. Medication: triamcinolone acetonide 40 mg/mL suspension for injection 1 mL total and 2 mL lidocaine 1% without epinephrine utilized for needle placement anesthetic Advised to contact for fevers/chills, erythema, induration, drainage, or persistent bleeding.  Procedure:  Injection of left knee under ultrasound guidance. Ultrasound guidance utilized for anterolateral approach out of plane, no sonographic abnormalities Samsung HS60 device utilized with permanent recording / reporting. Verbal informed consent obtained and verified. Skin prepped in a sterile fashion. Ethyl chloride for topical local analgesia.  Completed without difficulty and tolerated well. Medication: triamcinolone acetonide 40 mg/mL suspension for injection 1 mL total and 2 mL lidocaine 1% without epinephrine utilized for needle placement anesthetic Advised to contact for fevers/chills, erythema, induration, drainage, or persistent bleeding.   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  She experiences severe pain in both knees, with the right knee being more symptomatic. The pain is located in the back and front of the knee, radiating down into her shins, and is described as feeling like her bones are 'about to explode'. 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 Swelling in her feet and legs is also noted. No complete locking of the knees is reported, but they sometimes feel as though they might lock when turning.  She has a history of arthritis in her knees, confirmed by previous x-rays. Meloxicam was tried in the past without sufficient relief. Currently, she takes Tylenol and ibuprofen frequently, which causes gastrointestinal discomfort. Despite these treatments, she has not experienced significant relief since her last visit and continues to have significant inflammation and pain.  She is on a beta blocker, taking half a tablet twice a day, and monitors her heart rate at home. She has a history of reactions to anesthesia and cortisone shots, which causes concern about potential side effects.  Her vitamin D level was noted to be low at 15 ng/mL, and she is currently on a regimen of 50,000 IU of vitamin D, taking her fourth dose today. She has been prescribed a course of eight doses with several refills available.  Physical Exam INSPECTION: Swelling on the right knee. PALPATION: Questionable effusion  trace on the right knee, negative on the left. Nontender right lateral and medial patellar facets. Nontender lateral and medial patellar facets on the left knee.  Nontender medial and lateral joint lines on both knees. RANGE OF MOTION: Right knee ROM from 0 to 90 degrees with pain localized anteriorly and posteriorly. Left knee ROM from 0 to 110 degrees, limited by pain. SPECIAL TESTS: Negative anterior and posterior drawer tests on both knees. Negative valgus and varus stress tests on the right knee. Positive McMurray test on the right knee. Negative McMurray test on the left knee. Valgus stress test elicits medial pain, no laxity on the left knee. Negative varus stress test on the left knee.  Results LABS Vitamin D: 15 ng/mL  Assessment and Plan Knee Osteoarthritis Arthralgia bilaterally. Chronic knee pain with inflammation and limited range of motion, particularly in the right knee. X-ray with known left knee  osteoarthritis. Positive McMurray test suggests possible meniscal involvement on right. Significant functional limitations due to pain. Informed that cortisone injections provide temporary symptom relief but do not cure arthritis. - Administer cortisone injection under ultrasound guidance to both knees. - Fit with a hinged knee brace for the right knee. - Prescribe Celebrex to take twice daily until symptoms respond to cortisone (take with food). - Order x-ray of the right knee. - Consider MRI if symptoms do not improve in 2-4 weeks or if knee instability persists. - Advise to monitor heart rate and adjust beta blocker dosage if heart rate exceeds 100 bpm. - Encourage use of Xanax for anxiety related to treatment, ensuring safe use when driving.      Relevant Orders   US  LIMITED JOINT SPACE STRUCTURES LOW BILAT   DG Knee Complete 4 Views Right   Osteoarthritis of left patellofemoral joint   Relevant Orders   US  LIMITED JOINT SPACE STRUCTURES LOW BILAT     Orders & Medications Medications:  Meds ordered this encounter  Medications   DISCONTD: celecoxib (CELEBREX) 200 MG capsule    Sig: Take 1 capsule (200 mg total) by mouth 2 (two)  times daily as needed. One to 2 tablets by mouth daily as needed for pain.    Dispense:  60 capsule    Refill:  0   triamcinolone acetonide (KENALOG-40) injection 80 mg   Orders Placed This Encounter  Procedures   US  LIMITED JOINT SPACE STRUCTURES LOW BILAT   DG Knee Complete 4 Views Right     No follow-ups on file.     Ma Saupe, MD, [facility]   Primary Care Sports Medicine Primary Care and Sports Medicine at [facility]

## 2023-06-09 ENCOUNTER — Other Ambulatory Visit: Payer: Self-pay | Admitting: Family Medicine

## 2023-06-09 ENCOUNTER — Telehealth: Payer: Self-pay

## 2023-06-09 DIAGNOSIS — M1711 Unilateral primary osteoarthritis, right knee: Secondary | ICD-10-CM

## 2023-06-09 DIAGNOSIS — M1712 Unilateral primary osteoarthritis, left knee: Secondary | ICD-10-CM

## 2023-06-09 NOTE — Telephone Encounter (Signed)
 Spoke to patient and advised Xrays do show arthritis. 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 She will expect a call to schedule those. She feels pain in R knee on the side of her knee. She is off for spring break and taking it easy also wearing her brace.

## 2023-06-15 ENCOUNTER — Ambulatory Visit
Admission: RE | Admit: 2023-06-15 | Discharge: 2023-06-15 | Disposition: A | Discharge status: Home / Self Care | Source: Ambulatory Visit | Attending: Family Medicine | Admitting: Family Medicine

## 2023-06-15 DIAGNOSIS — M1711 Unilateral primary osteoarthritis, right knee: Secondary | ICD-10-CM | POA: Insufficient documentation

## 2023-06-15 DIAGNOSIS — M1712 Unilateral primary osteoarthritis, left knee: Secondary | ICD-10-CM | POA: Insufficient documentation

## 2023-07-08 ENCOUNTER — Encounter: Payer: Self-pay | Admitting: Family Medicine

## 2023-07-08 ENCOUNTER — Ambulatory Visit: Admitting: Family Medicine

## 2023-07-08 VITALS — BP 122/80 | HR 74 | Ht 71.0 in | Wt 213.0 lb

## 2023-07-08 DIAGNOSIS — G8929 Other chronic pain: Secondary | ICD-10-CM | POA: Diagnosis not present

## 2023-07-08 DIAGNOSIS — M25561 Pain in right knee: Secondary | ICD-10-CM

## 2023-07-08 DIAGNOSIS — M25562 Pain in left knee: Secondary | ICD-10-CM | POA: Diagnosis not present

## 2023-07-08 MED ORDER — CYCLOBENZAPRINE HCL 5 MG PO TABS: 5.0000 mg | ORAL_TABLET | Freq: Every evening | ORAL | 0 refills | Status: AC | PRN

## 2023-07-08 MED ORDER — METHYLPREDNISOLONE 4 MG PO TBPK: ORAL_TABLET | ORAL | 0 refills | Status: AC

## 2023-07-08 MED ORDER — TRAMADOL HCL 50 MG PO TABS: 50.0000 mg | ORAL_TABLET | Freq: Four times a day (QID) | ORAL | 0 refills | Status: AC | PRN

## 2023-07-11 ENCOUNTER — Other Ambulatory Visit: Payer: Self-pay

## 2023-07-11 ENCOUNTER — Encounter: Payer: Self-pay | Admitting: Family Medicine

## 2023-07-11 DIAGNOSIS — M1712 Unilateral primary osteoarthritis, left knee: Secondary | ICD-10-CM

## 2023-07-11 DIAGNOSIS — M2391 Unspecified internal derangement of right knee: Secondary | ICD-10-CM

## 2023-07-11 DIAGNOSIS — M1711 Unilateral primary osteoarthritis, right knee: Secondary | ICD-10-CM

## 2023-07-17 ENCOUNTER — Other Ambulatory Visit: Payer: Self-pay | Admitting: Family Medicine

## 2023-07-17 DIAGNOSIS — K219 Gastro-esophageal reflux disease without esophagitis: Secondary | ICD-10-CM

## 2023-07-17 DIAGNOSIS — I10 Essential (primary) hypertension: Secondary | ICD-10-CM

## 2023-07-17 DIAGNOSIS — E7849 Other hyperlipidemia: Secondary | ICD-10-CM

## 2023-07-18 DIAGNOSIS — G8929 Other chronic pain: Secondary | ICD-10-CM | POA: Insufficient documentation

## 2023-07-18 NOTE — Progress Notes (Signed)
 Primary Care / Sports Medicine Office Visit  Patient Information:  Patient ID: Sherri Matthews, female DOB: [birth year] Age: 58 y.o. MRN: [id number]   Sherri Matthews is a pleasant 58 y.o. female presenting with the following:  Chief Complaint  Patient presents with   Knee Pain    MRI review of right knee and left knee    Vitals:   07/08/23 1538  BP: 122/80  Pulse: 74  SpO2: 97%   Vitals:   07/08/23 1538  Weight: 213 lb (96.6 kg)  Height: 5\' 11"  (1.803 m)   Body mass index is 29.71 kg/m.  No results found.   Independent interpretation of notes and tests performed by another provider:   None  Procedures performed:   None  Pertinent History, Exam, Impression, and Recommendations:   Problem List Items Addressed This Visit     Bilateral chronic knee pain - Primary   History of Present Illness Sherri Matthews is a 57 year old female who presents with bilateral knee pain for follow-up after knee injections.  She experiences persistent bilateral knee pain, with the left knee being more severe than the right. The pain is severe today, located on the medial aspect of both knees, and exacerbated by activities such as standing, sitting, and weight-bearing on the left leg. This significantly impacts her daily activities, including drying off after a shower and dressing.  She received knee injections on April 10th, which initially reduced her pain from a 9-9.5/10 to a 7/10 at best. However, the pain has since increased to 8-8.5/10. The injections provided relief for about a month, but the pain is now worsening again.  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  Her current medications include Celebrex , which is not effectively managing her pain. She has previously  taken prednisone  but experienced adverse effects.   She experiences nocturnal leg cramps, particularly in the quadriceps, causing significant discomfort and sleep disruption. She uses a topical treatment for muscle spasms, which provides some relief.  Results RADIOLOGY Right knee MRI: Complex tears involving the medial meniscus, bone edema, cartilage loss, joint effusion Left knee MRI: Meniscal tear along the inner aspect, osteoarthritis primarily in the medial compartment, bone edema  Assessment and Plan Osteoarthritis of both knees Chronic osteoarthritis with significant pain and functional limitations. Right knee more severely affected with bone edema and meniscus damage. Discussed surgical options, emphasizing non-surgical management until surgery is arranged. Explained benefits and limitations of arthroscopy and knee replacement. - Refer to orthopedic surgery for evaluation and discussion of surgical options. - Recommend use of a cane to reduce weight-bearing. - Prescribe Medrol  dose pack for inflammation control. - Continue Celebrex  for pain management. - Discuss potential use of tramadol  for severe pain as needed. - Advise use of Flexeril  at night for muscle spasms. - Encourage rest and reduced activity.  Complex tear of medial meniscus, right knee Complex tear associated with advanced osteoarthritis and bone bruising. Discussed surgical options including trimming the meniscus or knee replacement. - Include in referral to orthopedic surgery for evaluation of meniscus tear and potential surgical intervention.  Meniscus tear, left knee Meniscus tear primarily affecting the inner aspect, associated with osteoarthritis. Less severe than the right knee. - Include in referral to orthopedic surgery for  evaluation of meniscus tear and potential surgical intervention.  Bone bruising in both knees Bone bruising secondary to advanced osteoarthritis, more pronounced in the right knee.  Explained prolonged healing time and impact of weight-bearing activities. - Recommend use of a cane to reduce weight-bearing and allow for healing. - Prescribe Medrol  dose pack to reduce inflammation and pain. - Continue Celebrex  for pain management. - Discuss potential use of tramadol  for severe pain as needed.      Relevant Medications   celecoxib  (CELEBREX ) 100 MG capsule   methylPREDNISolone  (MEDROL  DOSEPAK) 4 MG TBPK tablet   cyclobenzaprine  (FLEXERIL ) 5 MG tablet     Orders & Medications Medications:  Meds ordered this encounter  Medications   methylPREDNISolone  (MEDROL  DOSEPAK) 4 MG TBPK tablet    Sig: Use as directed.    Dispense:  21 each    Refill:  0   cyclobenzaprine  (FLEXERIL ) 5 MG tablet    Sig: Take 1-2 tablets (5-10 mg total) by mouth at bedtime as needed for muscle spasms.    Dispense:  30 tablet    Refill:  0   traMADol  (ULTRAM ) 50 MG tablet    Sig: Take 1 tablet (50 mg total) by mouth every 6 (six) hours as needed for up to 5 days for severe pain (pain score 7-10).    Dispense:  20 tablet    Refill:  0   No orders of the defined types were placed in this encounter.    No follow-ups on file.     Ma Saupe, MD, [facility]   Primary Care Sports Medicine Primary Care and Sports Medicine at [facility]

## 2023-07-18 NOTE — Assessment & Plan Note (Signed)
 History of Present Illness Raevyn Sokol is a 58 year old female who presents with bilateral knee pain for follow-up after knee injections.  She experiences persistent bilateral knee pain, with the left knee being more severe than the right. The pain is severe today, located on the medial aspect of both knees, and exacerbated by activities such as standing, sitting, and weight-bearing on the left leg. This significantly impacts her daily activities, including drying off after a shower and dressing.  She received knee injections on April 10th, which initially reduced her pain from a 9-9.5/10 to a 7/10 at best. However, the pain has since increased to 8-8.5/10. The injections provided relief for about a month, but the pain is now worsening again.  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  Her current medications include Celebrex , which is not effectively managing her pain. She has previously taken prednisone  but experienced adverse effects.   She experiences nocturnal leg cramps, particularly in the quadriceps, causing significant discomfort and sleep disruption. She uses a topical treatment for muscle spasms, which provides some relief.  Results RADIOLOGY Right knee MRI: Complex tears involving the medial meniscus, bone edema, cartilage loss, joint effusion Left knee MRI: Meniscal tear along the inner aspect, osteoarthritis primarily in the medial compartment, bone edema  Assessment and Plan Osteoarthritis of both knees Chronic osteoarthritis with significant pain and functional limitations. Right knee more severely affected with bone edema and meniscus damage. Discussed surgical options, emphasizing non-surgical management until surgery is arranged. Explained benefits and limitations of  arthroscopy and knee replacement. - Refer to orthopedic surgery for evaluation and discussion of surgical options. - Recommend use of a cane to reduce weight-bearing. - Prescribe Medrol  dose pack for inflammation control. - Continue Celebrex  for pain management. - Discuss potential use of tramadol  for severe pain as needed. - Advise use of Flexeril  at night for muscle spasms. - Encourage rest and reduced activity.  Complex tear of medial meniscus, right knee Complex tear associated with advanced osteoarthritis and bone bruising. Discussed surgical options including trimming the meniscus or knee replacement. - Include in referral to orthopedic surgery for evaluation of meniscus tear and potential surgical intervention.  Meniscus tear, left knee Meniscus tear primarily affecting the inner aspect, associated with osteoarthritis. Less severe than the right knee. - Include in referral to orthopedic surgery for evaluation of meniscus tear and potential surgical intervention.  Bone bruising in both knees Bone bruising secondary to advanced osteoarthritis, more pronounced in the right knee. Explained prolonged healing time and impact of weight-bearing activities. - Recommend use of a cane to reduce weight-bearing and allow for healing. - Prescribe Medrol  dose pack to reduce inflammation and pain. - Continue Celebrex  for pain management. - Discuss potential use of tramadol  for severe pain as needed.

## 2023-07-18 NOTE — Patient Instructions (Signed)
 Patient Action Plan  1. Orthopedic Surgery:    - Schedule an evaluation with an orthopedic surgeon to discuss surgical options for both knees, including the meniscus tears and potential knee replacement.  2. Pain and Inflammation Management:    - Use a cane to help reduce weight-bearing on your knees.    - Take the prescribed Medrol  dose pack to control inflammation.    - Continue taking Celebrex  for ongoing pain management.    - Consider using tramadol  for severe pain if needed, as discussed with your healthcare provider.    - Use Flexeril  at night to help with muscle spasms.  3. Activity Recommendations:    - Rest and reduce physical activity to aid in recovery and minimize joint strain.  Red Flags: Seek immediate medical attention if you experience sudden, severe knee pain, inability to bear weight on your knees, or signs of infection such as swelling, redness, or fever.

## 2023-08-08 NOTE — Progress Notes (Deleted)
 Electrophysiology Clinic Note    Date:  08/08/2023  Patient ID:  Sherri Matthews, Sherri Matthews Aug 09, 1965, MRN [id number] PCP:  Alena Hush, MD  Cardiologist:  None Electrophysiologist: None  ***refresh  Discussed the use of AI scribe software for clinical note transcription with the patient, who gave verbal consent to proceed.   Patient Profile    Chief Complaint: ***  History of Present Illness: Sherri Matthews is a 58 y.o. female with PMH notable for LBBB, PACs, Atach, HTN, OSA untreated, ; seen today for None for routine electrophysiology followup.   She last saw Dr. Rodolfo Clan 11/2021 for initial eval of LBBB  Since last being seen in our clinic the patient reports doing ***.  she denies chest pain, palpitations, dyspnea, PND, orthopnea, nausea, vomiting, dizziness, syncope, edema, weight gain, or early satiety.      Arrhythmia/Device History No specialty comments available.    Device Information: ***  AAD History: ***    ROS:  Please see the history of present illness. All other systems are reviewed and otherwise negative.    Physical Exam    VS:  LMP 04/18/2017 (Approximate)  BMI: There is no height or weight on file to calculate BMI.  Wt Readings from Last 3 Encounters:  07/08/23 213 lb (96.6 kg)  06/02/23 216 lb (98 kg)  04/17/23 218 lb 14.7 oz (99.3 kg)     GEN- The patient is well appearing, alert and oriented x 3 today.   Lungs- Clear to ausculation bilaterally, normal work of breathing.  Heart- {Blank single:19197::Regular,Irregularly irregular} rate and rhythm, no murmurs, rubs or gallops Extremities- {EDEMA LEVEL:28147::No} peripheral edema, warm, dry Skin-  *** device pocket well-healed, no tethering   Device interrogation done today and reviewed by myself:  Battery *** Lead thresholds, impedence, sensing stable *** *** episodes *** changes made today   Studies Reviewed   Previous EP, cardiology notes.    EKG {ACTION; IS/IS  BJY:78295621} ordered. Personal review of EKG from {Blank single:19197::today,***} shows:  ***        TTE, 01/08/2022  1. Left ventricular ejection fraction, by estimation, is 55 %. The left  ventricle has normal function. The left ventricle has no regional wall  motion abnormalities. Left ventricular diastolic parameters are consistent with Grade I diastolic dysfunction (impaired relaxation).   2. Right ventricular systolic function is normal. The right ventricular  size is normal.   3. The mitral valve is normal in structure. No evidence of mitral valve regurgitation. No evidence of mitral stenosis.   4. The aortic valve is tricuspid. Aortic valve regurgitation is not  visualized. Aortic valve sclerosis is present, with no evidence of aortic valve stenosis.   5. The inferior vena cava is normal in size with greater than 50%  respiratory variability, suggesting right atrial pressure of 3 mmHg.   Coronary CTA, 12/11/2021 1. Normal coronary calcium  score of 0. Patient is low risk for coronary events.  2. Normal coronary origin with right dominance.  3. No evidence of CAD.  4. CAD-RADS 0. Consider non-atherosclerotic causes of chest pain.  Holter, 2021 (via 01/2020 Dr. Cara Chancellor note) predominately normal sinus rhythm with rare PVCs and occasional PACs. There were short runs of SVT, longest lasting 14 beats, with no evidence of sustained arrhythmias    Assessment and Plan     #) palpitations  #) LBBB    #) ***   {Are you ordering a CV Procedure (e.g. stress test, cath, DCCV, TEE, etc)?  Press F2        :[id number]}   Current medicines are reviewed at length with the patient today.   The patient {ACTIONS; HAS/DOES NOT HAVE:19233} concerns regarding her medicines.  The following changes were made today:  {NONE DEFAULTED:18576}  Labs/ tests ordered today include: *** No orders of the defined types were placed in this encounter.    Disposition: Follow up with  {EPMDS:28135::EP Team} or EP APP {EPFOLLOW UP:28173}   Signed, Adaline Holly, NP  08/08/23  8:04 PM  Electrophysiology [facility]

## 2023-08-09 ENCOUNTER — Ambulatory Visit: Admitting: Cardiology

## 2023-08-09 DIAGNOSIS — I447 Left bundle-branch block, unspecified: Secondary | ICD-10-CM

## 2023-08-09 NOTE — Progress Notes (Deleted)
 Electrophysiology Clinic Note    Date:  08/09/2023  Patient ID:  Sherri Matthews, Sherri Matthews 1965-08-13, MRN [id number] PCP:  Salli Amato, MD  Cardiologist:  None Electrophysiologist: Elspeth Sage, MD  ***refresh  Discussed the use of AI scribe software for clinical note transcription with the patient, who gave verbal consent to proceed.   Patient Profile    Chief Complaint: LBBB follow-up  History of Present Illness: Sherri Matthews is a 58 y.o. female with PMH notable for LBBB, PACs, Atach, HTN, OSA untreated, thyroid  nodule; seen today for Elspeth Sage, MD for routine electrophysiology followup.   She last saw Dr. Sage 11/2021 for initial eval of LBBB and dyspnea. Recommended BP control, TTE and CTA.  She has recently been diagnosed with thyroid  nodule, planning for subtotal thyroidectomy in July 2025 at [facility].   On follow-up today, *** palpitations *** Bp control  *** osa treatment  Since last being seen in our clinic the patient reports doing ***.  she denies chest pain, palpitations, dyspnea, PND, orthopnea, nausea, vomiting, dizziness, syncope, edema, weight gain, or early satiety.      Arrhythmia/Device History No specialty comments available.     ROS:  Please see the history of present illness. All other systems are reviewed and otherwise negative.    Physical Exam    VS:  LMP 04/18/2017 (Approximate)  BMI: There is no height or weight on file to calculate BMI.  Wt Readings from Last 3 Encounters:  07/08/23 213 lb (96.6 kg)  06/02/23 216 lb (98 kg)  04/17/23 218 lb 14.7 oz (99.3 kg)     GEN- The patient is well appearing, alert and oriented x 3 today.   Lungs- Clear to ausculation bilaterally, normal work of breathing.  Heart- {Blank single:19197::Regular,Irregularly irregular} rate and rhythm, no murmurs, rubs or gallops Extremities- {EDEMA LEVEL:28147::No} peripheral edema, warm, dry    Studies Reviewed   Previous EP, cardiology  notes.    EKG is ordered. Personal review of EKG from today shows:  ***        TTE, 01/08/2022  1. Left ventricular ejection fraction, by estimation, is 55 %. The left  ventricle has normal function. The left ventricle has no regional wall  motion abnormalities. Left ventricular diastolic parameters are consistent with Grade I diastolic dysfunction (impaired relaxation).   2. Right ventricular systolic function is normal. The right ventricular  size is normal.   3. The mitral valve is normal in structure. No evidence of mitral valve regurgitation. No evidence of mitral stenosis.   4. The aortic valve is tricuspid. Aortic valve regurgitation is not  visualized. Aortic valve sclerosis is present, with no evidence of aortic valve stenosis.   5. The inferior vena cava is normal in size with greater than 50%  respiratory variability, suggesting right atrial pressure of 3 mmHg.   Coronary CTA, 12/11/2021 1. Normal coronary calcium  score of 0. Patient is low risk for coronary events.  2. Normal coronary origin with right dominance.  3. No evidence of CAD.  4. CAD-RADS 0. Consider non-atherosclerotic causes of chest pain.  Holter, 2021 (via 01/2020 Dr. Bosie note) predominately normal sinus rhythm with rare PVCs and occasional PACs. There were short runs of SVT, longest lasting 14 beats, with no evidence of sustained arrhythmias    Assessment and Plan     #) palpitations  #) LBBB    #) ***   {Are you ordering a CV Procedure (e.g. stress test, cath, DCCV, TEE, etc)?  Press F2        :[id number]}   Current medicines are reviewed at length with the patient today.   The patient {ACTIONS; HAS/DOES NOT HAVE:19233} concerns regarding her medicines.  The following changes were made today:  {NONE DEFAULTED:18576}  Labs/ tests ordered today include: *** No orders of the defined types were placed in this encounter.    Disposition: Follow up with {EPMDS:28135::EP Team} or EP APP  {EPFOLLOW UP:28173}   Signed, Chantal Needle, NP  08/09/23  8:18 AM  Electrophysiology [facility]

## 2023-08-18 ENCOUNTER — Other Ambulatory Visit: Payer: Self-pay | Admitting: Internal Medicine

## 2023-08-18 DIAGNOSIS — Z1231 Encounter for screening mammogram for malignant neoplasm of breast: Secondary | ICD-10-CM

## 2023-08-22 ENCOUNTER — Ambulatory Visit: Attending: Cardiology | Admitting: Cardiology

## 2023-08-22 DIAGNOSIS — R002 Palpitations: Secondary | ICD-10-CM

## 2023-08-22 DIAGNOSIS — I447 Left bundle-branch block, unspecified: Secondary | ICD-10-CM

## 2023-09-08 ENCOUNTER — Encounter: Payer: Self-pay | Admitting: Cardiology

## 2023-09-26 ENCOUNTER — Ambulatory Visit
Admission: RE | Admit: 2023-09-26 | Discharge: 2023-09-26 | Disposition: A | Discharge status: Home / Self Care | Source: Ambulatory Visit | Attending: Internal Medicine | Admitting: Internal Medicine

## 2023-09-26 DIAGNOSIS — Z1231 Encounter for screening mammogram for malignant neoplasm of breast: Secondary | ICD-10-CM | POA: Diagnosis present

## 2023-11-14 ENCOUNTER — Telehealth: Payer: Self-pay | Admitting: Family Medicine

## 2023-11-14 NOTE — Telephone Encounter (Unsigned)
 Copied from CRM [id number]. Topic: Medical Record Request - Records Request >> Nov 14, 2023 11:05 AM Tiffini S wrote: Reason for CRM: Patient will have a appointment with [facility] on 11/16/23 at 8:00am   Please fax medical records to: [phone] Patient is asking for a call back at [phone]

## 2023-11-14 NOTE — Telephone Encounter (Signed)
 Hi Sherri Matthews,  Patient needs medical records faxed to [facility]. Thank you  JM

## 2023-11-14 NOTE — Telephone Encounter (Signed)
 Hi Felicia,  Patient needs medical records faxed to [facility]. Thank you  JM

## 2023-11-30 ENCOUNTER — Emergency Department

## 2023-11-30 ENCOUNTER — Emergency Department
Admission: EM | Admit: 2023-11-30 | Discharge: 2023-11-30 | Disposition: A | Discharge status: Home / Self Care | Attending: Emergency Medicine | Admitting: Emergency Medicine

## 2023-11-30 ENCOUNTER — Other Ambulatory Visit: Payer: Self-pay

## 2023-11-30 DIAGNOSIS — Z85850 Personal history of malignant neoplasm of thyroid: Secondary | ICD-10-CM | POA: Insufficient documentation

## 2023-11-30 DIAGNOSIS — I4891 Unspecified atrial fibrillation: Secondary | ICD-10-CM | POA: Insufficient documentation

## 2023-11-30 DIAGNOSIS — R079 Chest pain, unspecified: Secondary | ICD-10-CM

## 2023-11-30 DIAGNOSIS — R002 Palpitations: Secondary | ICD-10-CM

## 2023-11-30 DIAGNOSIS — I10 Essential (primary) hypertension: Secondary | ICD-10-CM | POA: Diagnosis not present

## 2023-11-30 LAB — CBC WITH DIFFERENTIAL/PLATELET
Abs Immature Granulocytes: 0.03 K/uL (ref 0.00–0.07)
Basophils Absolute: 0.1 K/uL (ref 0.0–0.1)
Basophils Relative: 1 %
Eosinophils Absolute: 0.1 K/uL (ref 0.0–0.5)
Eosinophils Relative: 2 %
HCT: 36.7 % (ref 36.0–46.0)
Hemoglobin: 12.2 g/dL (ref 12.0–15.0)
Immature Granulocytes: 0 %
Lymphocytes Relative: 27 %
Lymphs Abs: 2 K/uL (ref 0.7–4.0)
MCH: 28 pg (ref 26.0–34.0)
MCHC: 33.2 g/dL (ref 30.0–36.0)
MCV: 84.4 fL (ref 80.0–100.0)
Monocytes Absolute: 0.6 K/uL (ref 0.1–1.0)
Monocytes Relative: 8 %
Neutro Abs: 4.5 K/uL (ref 1.7–7.7)
Neutrophils Relative %: 62 %
Platelets: 264 K/uL (ref 150–400)
RBC: 4.35 MIL/uL (ref 3.87–5.11)
RDW: 13.2 % (ref 11.5–15.5)
WBC: 7.4 K/uL (ref 4.0–10.5)
nRBC: 0 % (ref 0.0–0.2)

## 2023-11-30 LAB — COMPREHENSIVE METABOLIC PANEL WITH GFR
ALT: 19 U/L (ref 0–44)
AST: 26 U/L (ref 15–41)
Albumin: 3.7 g/dL (ref 3.5–5.0)
Alkaline Phosphatase: 78 U/L (ref 38–126)
Anion gap: 11 (ref 5–15)
BUN: 15 mg/dL (ref 6–20)
CO2: 23 mmol/L (ref 22–32)
Calcium: 8.5 mg/dL — ABNORMAL LOW (ref 8.9–10.3)
Chloride: 107 mmol/L (ref 98–111)
Creatinine, Ser: 0.77 mg/dL (ref 0.44–1.00)
GFR, Estimated: >60 mL/min (ref >60)
Glucose, Bld: 129 mg/dL — ABNORMAL HIGH (ref 70–99)
Potassium: 3.1 mmol/L — ABNORMAL LOW (ref 3.5–5.1)
Sodium: 141 mmol/L (ref 135–145)
Total Bilirubin: 0.6 mg/dL (ref 0.0–1.2)
Total Protein: 6.9 g/dL (ref 6.5–8.1)

## 2023-11-30 LAB — D-DIMER, QUANTITATIVE: D-Dimer, Quant: 0.54 ug{FEU}/mL — ABNORMAL HIGH (ref 0.00–0.50)

## 2023-11-30 LAB — TROPONIN I (HIGH SENSITIVITY)
Troponin I (High Sensitivity): 7 ng/L (ref <18)
Troponin I (High Sensitivity): 10 ng/L (ref <18)

## 2023-11-30 LAB — MAGNESIUM: Magnesium: 1.9 mg/dL (ref 1.7–2.4)

## 2023-11-30 LAB — TSH: TSH: 2.205 u[IU]/mL (ref 0.350–4.500)

## 2023-11-30 LAB — T4, FREE: Free T4: 0.98 ng/dL (ref 0.61–1.12)

## 2023-11-30 MED ORDER — METOPROLOL TARTRATE 25 MG PO TABS: 12.5000 mg | ORAL_TABLET | Freq: Once | ORAL | Status: DC

## 2023-11-30 MED ORDER — POTASSIUM CHLORIDE CRYS ER 20 MEQ PO TBCR
40.0000 meq | EXTENDED_RELEASE_TABLET | Freq: Once | ORAL | Status: AC
  Administered 2023-11-30: 40 meq via ORAL
  Filled 2023-11-30: qty 2

## 2023-11-30 MED ORDER — IOHEXOL 350 MG/ML SOLN
75.0000 mL | Freq: Once | INTRAVENOUS | Status: AC | PRN
Start: 2023-11-30 — End: 2023-11-30
  Administered 2023-11-30: 75 mL via INTRAVENOUS

## 2023-11-30 MED ORDER — HEPARIN (PORCINE) 25000 UT/250ML-% IV SOLN
1450.0000 [IU]/h | INTRAVENOUS | Status: DC
  Administered 2023-11-30: 1450 [IU]/h via INTRAVENOUS
  Filled 2023-11-30: qty 250

## 2023-11-30 MED ORDER — METOPROLOL TARTRATE 5 MG/5ML IV SOLN
5.0000 mg | INTRAVENOUS | Status: DC | PRN
  Administered 2023-11-30: 5 mg via INTRAVENOUS
  Filled 2023-11-30 (×2): qty 5

## 2023-11-30 MED ORDER — HEPARIN (PORCINE) 25000 UT/250ML-% IV SOLN: 1450.0000 [IU]/h | INTRAVENOUS | Status: DC

## 2023-11-30 MED ORDER — APIXABAN 5 MG PO TABS: 5.0000 mg | ORAL_TABLET | Freq: Two times a day (BID) | ORAL | 1 refills | Status: AC

## 2023-11-30 MED ORDER — IBUPROFEN 600 MG PO TABS
600.0000 mg | ORAL_TABLET | Freq: Once | ORAL | Status: AC
  Administered 2023-11-30: 600 mg via ORAL
  Filled 2023-11-30: qty 1

## 2023-11-30 MED ORDER — HEPARIN BOLUS VIA INFUSION
5000.0000 [IU] | Freq: Once | INTRAVENOUS | Status: AC
  Administered 2023-11-30: 5000 [IU] via INTRAVENOUS
  Filled 2023-11-30: qty 5000

## 2023-11-30 MED ORDER — SODIUM CHLORIDE 0.9 % IV BOLUS
1000.0000 mL | Freq: Once | INTRAVENOUS | Status: AC
  Administered 2023-11-30: 1000 mL via INTRAVENOUS

## 2023-11-30 NOTE — ED Notes (Signed)
 MD made aware pt requesting ibuprofen  for chest pressure

## 2023-11-30 NOTE — ED Triage Notes (Signed)
 Pt presented to ED BIBA from home with c/o palpitations that woke her up out of her sleep around 2300. States took a home metoprolol  12.5mg  at 2330 for symptoms. Pt also endorses mild dizziness and weakness. Pt received 324mg  ASA, 1 nitroglycerin  spray, and 500ml NS bolus via EMS. Hx LBBB, HTN, GERD

## 2023-11-30 NOTE — Progress Notes (Signed)
 PHARMACY - ANTICOAGULATION CONSULT NOTE  Pharmacy Consult for  Heparin   Indication: atrial fibrillation  Allergies  Allergen Reactions   Ciprofloxacin  Nausea And Vomiting   Oxycodone-Acetaminophen  Itching   Oxycodone-Acetaminophen  Itching   Sertraline Other (See Comments)    Pt prefers to never be on this again.     Vicodin [Hydrocodone-Acetaminophen ] Palpitations   Dilaudid [Hydromorphone Hcl] Other (See Comments)   Fenofibrate Other (See Comments)    Myalgia   Prednisone  Anxiety, Other (See Comments) and Palpitations    Patient Measurements: Height: 5' 11 (180.3 cm) Weight: 99.6 kg (219 lb 8 oz) IBW/kg (Calculated) : 70.8 HEPARIN  DW (KG): 91.8  Vital Signs: Temp: 98.7 F (37.1 C) (10/08 0134) Temp Source: Oral (10/08 0134) BP: 141/75 (10/08 0200) Pulse Rate: 68 (10/08 0200)  Labs: Recent Labs    11/30/23 0138  HGB 12.2  HCT 36.7  PLT 264    CrCl cannot be calculated (Patient's most recent lab result is older than the maximum 21 days allowed.).   Medical History: Past Medical History:  Diagnosis Date   Anxiety    AV node dysfunction    tachycardia sees Dr. Bosie   Cervical disc disease    degenerative disk    Chronic abdominal pain    Heart murmur    Hyperlipidemia    Hypertension    IBS (irritable bowel syndrome)    Mitral valve prolapse    Palpitations    Sleep apnea    possible but not tested    Medications:  (Not in a hospital admission)   Assessment: Pharmacy consulted to dose heparin  in this 58 year old female admitted with Afib.  No prior anticoag noted. CrCl = ?   Goal of Therapy:  Heparin  level 0.3-0.7 units/ml Monitor platelets by anticoagulation protocol: Yes   Plan:  Give 5000 units bolus x 1 Start heparin  infusion at 1450 units/hr Check anti-Xa level in 6 hours and daily while on heparin  Continue to monitor H&H and platelets  Ronell Boldin D 11/30/2023,2:07 AM

## 2023-11-30 NOTE — ED Notes (Signed)
 ED Provider at bedside.

## 2023-11-30 NOTE — Discharge Instructions (Addendum)
 Continue taking your metoprolol  25 mg twice daily as previously prescribed.  Take your blood thinner Eliquis as prescribed.  With your new blood thinner medication it is important that you avoid all medications within the NSAID category which includes your previously prescribed celecoxib , and medications like Advil , Motrin , ibuprofen , Aleve , naproxen .  I made a referral for cardiology to call you to schedule an appointment for further evaluation of your new atrial fibrillation.  At the time of discharge and for the duration of your hospital stay, your heart had converted back to a normal rhythm.  Thank you for choosing us  for your health care today!  Please see your primary doctor this week for a follow up appointment.   If you have any new, worsening, or unexpected symptoms call your doctor right away or come back to the emergency department for reevaluation.  It was my pleasure to care for you today.   Ginnie EDISON Cyrena, MD

## 2023-11-30 NOTE — ED Provider Notes (Addendum)
 [facility] Provider Note    Event Date/Time   First MD Initiated Contact with Patient 11/30/23 0144     (approximate)   History   Palpitations   HPI  Sherri Matthews is a 58 y.o. female   Past medical history of known left bundle branch block, palpitations, SVT, thyroid  cancer status post resection no further treatment currently, hypertension, hyperlipidemia, here with palpitations.  Felt her heart racing tonight.  Felt chest tightness as well.  No shortness of breath.  No drug or alcohol use.  Does not take thyroid  medicine.  No recent illnesses including GI GU or respiratory complaints.  Was feeling well just prior to symptom onset this evening.  No history of clots, clotting disorder, leg symptoms.  Strong family history of atrial fibrillation.  Never diagnosed with atrial fibrillation in the past.  Independent Historian contributed to assessment above: EMS gives report including vital signs during transport as high as 180s heart rate  External Medical Documents Reviewed: Prior cardiology notes from 2023      Physical Exam   Triage Vital Signs: ED Triage Vitals  Encounter Vitals Group     BP 11/30/23 0134 (!) 160/100     Girls Systolic BP Percentile --      Girls Diastolic BP Percentile --      Boys Systolic BP Percentile --      Boys Diastolic BP Percentile --      Pulse Rate 11/30/23 0134 (!) 112     Resp 11/30/23 0134 18     Temp 11/30/23 0134 98.7 F (37.1 C)     Temp Source 11/30/23 0134 Oral     SpO2 11/30/23 0134 98 %     Weight 11/30/23 0135 219 lb 8 oz (99.6 kg)     Height 11/30/23 0135 5' 11 (1.803 m)     Head Circumference --      Peak Flow --      Pain Score --      Pain Loc --      Pain Education --      Exclude from Growth Chart --     Most recent vital signs: Vitals:   11/30/23 0530 11/30/23 0600  BP: 132/77 133/73  Pulse: (!) 56 60  Resp: 15 16  Temp:    SpO2: 97% 97%    General: Awake, no distress.   CV:  Good peripheral perfusion.  Resp:  Normal effort.  Abd:  No distention.  Other:  Pleasant woman slightly hypertensive slightly tachycardic irregular rates anywhere from 100-140 on cardiac monitoring.  Clear lungs without focality or wheezing.  Skin appears warm well-perfused.  Mentation is normal   ED Results / Procedures / Treatments   Labs (all labs ordered are listed, but only abnormal results are displayed) Labs Reviewed  COMPREHENSIVE METABOLIC PANEL WITH GFR - Abnormal; Notable for the following components:      Result Value   Potassium 3.1 (*)    Glucose, Bld 129 (*)    Calcium  8.5 (*)    All other components within normal limits  D-DIMER, QUANTITATIVE - Abnormal; Notable for the following components:   D-Dimer, Quant 0.54 (*)    All other components within normal limits  CBC WITH DIFFERENTIAL/PLATELET  TSH  MAGNESIUM  T4, FREE  HEPARIN  LEVEL (UNFRACTIONATED)  TROPONIN I (HIGH SENSITIVITY)  TROPONIN I (HIGH SENSITIVITY)     I ordered and reviewed the above labs they are notable for cell counts unremarkable.  EKG  ED ECG REPORT I, Ginnie Shams, the attending physician, personally viewed and interpreted this ECG.   Date: 11/30/2023  EKG Time: 0132  Rate: 105  Rhythm: AF RVR  Axis: nl  Intervals:lbbb  ST&T Change: no stemi    RADIOLOGY I independently reviewed and interpreted chest x-ray and I see no obvious focality pneumothorax I also reviewed radiologist's formal read.   PROCEDURES:  Critical Care performed: No  .Critical Care  Performed by: Shams Ginnie, MD Authorized by: Shams Ginnie, MD   Critical care provider statement:    Critical care time (minutes):  30   Critical care was time spent personally by me on the following activities:  Development of treatment plan with patient or surrogate, discussions with consultants, evaluation of patient's response to treatment, examination of patient, ordering and review of laboratory studies, ordering  and review of radiographic studies, ordering and performing treatments and interventions, pulse oximetry, re-evaluation of patient's condition and review of old charts    MEDICATIONS ORDERED IN ED: Medications  metoprolol  tartrate (LOPRESSOR ) injection 5 mg (5 mg Intravenous Given 11/30/23 0149)  metoprolol  tartrate (LOPRESSOR ) tablet 12.5 mg (12.5 mg Oral Patient Refused/Not Given 11/30/23 0241)  heparin  ADULT infusion 100 units/mL (25000 units/250mL) (1,450 Units/hr Intravenous Infusion Verify 11/30/23 0219)  sodium chloride  0.9 % bolus 1,000 mL (1,000 mLs Intravenous New Bag/Given 11/30/23 0142)  heparin  bolus via infusion 5,000 Units (5,000 Units Intravenous Bolus from Bag 11/30/23 0201)  potassium chloride  SA (KLOR-CON  M) CR tablet 40 mEq (40 mEq Oral Given 11/30/23 0245)  ibuprofen  (ADVIL ) tablet 600 mg (600 mg Oral Given 11/30/23 0245)  iohexol  (OMNIPAQUE ) 350 MG/ML injection 75 mL (75 mLs Intravenous Contrast Given 11/30/23 0342)    External physician / consultants:  I spoke with hospital medicine regarding admission and regarding care plan for this patient.   IMPRESSION / MDM / ASSESSMENT AND PLAN / ED COURSE  I reviewed the triage vital signs and the nursing notes.                                Patient's presentation is most consistent with acute presentation with potential threat to life or bodily function.  Differential diagnosis includes, but is not limited to, atrial fibrillation with RVR, electrolyte derangement, thyroid  dysfunction, dehydration, ACS, PE   The patient is on the cardiac monitor to evaluate for evidence of arrhythmia and/or significant heart rate changes.  MDM:    New onset atrial fibrillation with RVR, pre-existing left bundle branch block reidentified with no ischemic changes, will check electrolytes, thyroid  function, basic labs.  Started on heparin  (CHA2DS2-VASc score is 2 points, recommendation is anticoagulation to be considered and patient preference  is to start.)  Give IV metoprolol  for rate control.  Considered PE, ACS - Will check troponins.  Considered PE given her chest pressure as well as new atrial fibrillation, started D-dimer, mildly elevated, will proceed with CT angiogram.   -- After the initial dose of 5 mg IV metoprolol  for her initial heart rates 100-140, normotensive, which quickly responded down to 66, regular rhythm and a repeat EKG showed that she had returned to sinus rhythm.  Pending labs, reassessment keep on cardiac monitor for disposition.  Initially the patient was very concerned given her symptoms, noted that she is  very sensitive to new medications and very strongly preferred admission for her new atrial fibrillation diagnosis.  Will await the labs, PE workup, monitoring to see if  recurrent A-fib, and a rediscussion for disposition given that she has now converted to sinus rhythm.  -- I considered admission.  However, patient has been stable and comfortable with normal sinus rhythm in the 60s, normotensive and asymptomatic for the duration of her stay in the emergency department.  CT angiogram is negative.  Troponins flat.  Given resolution of her atrial fibrillation and conversion to sinus with no further symptoms and unremarkable evaluation as above, she is appropriate for discharge with outpatient follow-up at this time.  She understands to return with any new or worsening symptoms.       FINAL CLINICAL IMPRESSION(S) / ED DIAGNOSES   Final diagnoses:  Atrial fibrillation with rapid ventricular response (HCC)  Palpitations  Nonspecific chest pain     Rx / DC Orders   ED Discharge Orders          Ordered    Ambulatory referral to Cardiology       Comments: If you have not heard from the Cardiology office within the next 72 hours please call [phone].   11/30/23 0558    apixaban (ELIQUIS) 5 MG TABS tablet  2 times daily        11/30/23 0610             Note:  This document was prepared  using Dragon voice recognition software and may include unintentional dictation errors.    Cyrena Mylar, MD 11/30/23 DELON    Cyrena Mylar, MD 11/30/23 [phone]

## 2024-01-17 ENCOUNTER — Other Ambulatory Visit: Payer: Self-pay | Admitting: Physician Assistant

## 2024-01-17 ENCOUNTER — Ambulatory Visit
Admission: RE | Admit: 2024-01-17 | Discharge: 2024-01-17 | Disposition: A | Discharge status: Home / Self Care | Source: Ambulatory Visit | Attending: Physician Assistant | Admitting: Physician Assistant

## 2024-01-17 DIAGNOSIS — R0602 Shortness of breath: Secondary | ICD-10-CM

## 2024-01-17 DIAGNOSIS — M79662 Pain in left lower leg: Secondary | ICD-10-CM | POA: Insufficient documentation

## 2024-01-23 NOTE — Progress Notes (Deleted)
  Electrophysiology Office Note:   Date:  01/23/2024  ID:  Sherri Matthews, DOB Oct 14, 1965, MRN [id number]  Primary Cardiologist: None Electrophysiologist: Fonda Kitty, MD  {Click to update primary MD,subspecialty MD or APP then REFRESH:1}    History of Present Illness:   Sherri Matthews is a 58 y.o. female with h/o OSA, hypertension, papillary thyroid  cancer s/p surgery, LBBB, AVNRT and atrial fibrillation who is being seen today for evaluation for AF management.  Discussed the use of AI scribe software for clinical note transcription with the patient, who gave verbal consent to proceed.  History of Present Illness     Review of systems complete and found to be negative unless listed in HPI.   EP Information / Studies Reviewed:    EKG is not ordered today. EKG from 11/30/23 reviewed which showed SR with LBBB, QRS .     ECG 11/30/23: AF   Echo 01/08/22:  1. Left ventricular ejection fraction, by estimation, is 55 %. The left  ventricle has normal function. The left ventricle has no regional wall  motion abnormalities. Left ventricular diastolic parameters are consistent  with Grade I diastolic dysfunction  (impaired relaxation).   2. Right ventricular systolic function is normal. The right ventricular  size is normal.   3. The mitral valve is normal in structure. No evidence of mitral valve  regurgitation. No evidence of mitral stenosis.   4. The aortic valve is tricuspid. Aortic valve regurgitation is not  visualized. Aortic valve sclerosis is present, with no evidence of aortic  valve stenosis.   5. The inferior vena cava is normal in size with greater than 50%  respiratory variability, suggesting right atrial pressure of 3 mmHg.   Coronary CTA 12/11/21: IMPRESSION: 1. Normal coronary calcium  score of 0. Patient is low risk for coronary events.   2. Normal coronary origin with right dominance.   3. No evidence of CAD.   4. CAD-RADS 0. Consider  non-atherosclerotic causes of chest pain.    Physical Exam:   VS:  LMP 04/18/2017 (Approximate)    Wt Readings from Last 3 Encounters:  11/30/23 219 lb 8 oz (99.6 kg)  07/08/23 213 lb (96.6 kg)  06/02/23 216 lb (98 kg)     GEN: Well nourished, well developed in no acute distress NECK: No JVD CARDIAC: {EPRHYTHM:28826}, no murmurs, rubs, gallops RESPIRATORY:  Clear to auscultation without rales, wheezing or rhonchi  ABDOMEN: Soft, non-distended EXTREMITIES:  No edema; No deformity   ASSESSMENT AND PLAN:    #Paroxysmal atrial fibrillation: #Hypercoagulable state due to AF: CHADSVASC score of 2 with 1 point being for female sex. -Continue Eliquis  5mg  BID.  -Continue metoprolol  12.5mg  BID.  #?AVNRT: Chart diagnosis.  -Continue metoprolol  12.5mg  BID. -Full EP study at time of ablation.  #LBBB:  - Last echo with normal LVEF.  #Hypertension: *** goal today.  Recommend checking blood pressures 1-2 times per week at home and recording the values.  Recommend bringing these recordings to the primary care physician.  #OSA: -Encouraged compliance with CPAP. Assessment & Plan       Follow up with {EPMDS:28135::EP Team} {EPFOLLOW LE:71826}  Signed, Fonda Kitty, MD

## 2024-01-24 ENCOUNTER — Ambulatory Visit: Attending: Cardiology | Admitting: Cardiology

## 2024-01-24 DIAGNOSIS — I447 Left bundle-branch block, unspecified: Secondary | ICD-10-CM

## 2024-01-24 DIAGNOSIS — I480 Paroxysmal atrial fibrillation: Secondary | ICD-10-CM

## 2024-01-24 DIAGNOSIS — G4733 Obstructive sleep apnea (adult) (pediatric): Secondary | ICD-10-CM

## 2024-01-24 DIAGNOSIS — I10 Essential (primary) hypertension: Secondary | ICD-10-CM

## 2024-01-24 DIAGNOSIS — I4719 Other supraventricular tachycardia: Secondary | ICD-10-CM

## 2024-02-08 ENCOUNTER — Ambulatory Visit

## 2024-02-08 ENCOUNTER — Ambulatory Visit
Admission: EM | Admit: 2024-02-08 | Discharge: 2024-02-08 | Disposition: A | Discharge status: Home / Self Care | Attending: Emergency Medicine | Admitting: Emergency Medicine

## 2024-02-08 ENCOUNTER — Encounter: Payer: Self-pay | Admitting: Emergency Medicine

## 2024-02-08 ENCOUNTER — Ambulatory Visit (HOSPITAL_COMMUNITY): Payer: Self-pay

## 2024-02-08 DIAGNOSIS — K5900 Constipation, unspecified: Secondary | ICD-10-CM

## 2024-02-08 DIAGNOSIS — R109 Unspecified abdominal pain: Secondary | ICD-10-CM

## 2024-02-08 LAB — POCT URINE DIPSTICK
Bilirubin, UA: NEGATIVE
Blood, UA: NEGATIVE
Glucose, UA: NEGATIVE mg/dL
Ketones, POC UA: NEGATIVE mg/dL
Leukocytes, UA: NEGATIVE
Nitrite, UA: NEGATIVE
Protein Ur, POC: NEGATIVE mg/dL
Spec Grav, UA: 1.025 (ref 1.010–1.025)
Urobilinogen, UA: 0.2 U/dL
pH, UA: 5.5 (ref 5.0–8.0)

## 2024-02-08 NOTE — Discharge Instructions (Addendum)
 Increase your oral water intake so that you are remaining well-hydrated and there is increased water to help soften your bowel.  Take over-the-counter MiraLAX, 1 capful (17 g) in 8 ounces of a beverage of your choice daily until you are having normal bowel movements.  You can also take 2 over-the-counter Dulcolax tablets along with your first dose of MiraLAX to help move your stools along.  Increase your oral dietary fiber intake so as to add bulk to your stool and help decrease incidence of constipation.  If you have any increasing abdominal pain, blood in your stool, abdominal bloating, or fever please follow-up in the emergency department for evaluation.

## 2024-02-08 NOTE — ED Provider Notes (Addendum)
 [facility]    CSN: [id number] Arrival date & time: 02/08/24  9175      History   Chief Complaint Chief Complaint  Patient presents with   Back Pain   Abdominal Pain    HPI Sherri Matthews is a 58 y.o. female.   HPI  58 year old female with past medical history significant for hypertension, hyperlipidemia, heart murmur/MVP, AV node dysfunction, sleep apnea, cervical disc disease, IBS, chronic abdominal pain, and anxiety presents for evaluation of bilateral mid thoracic back pain with occasional radiation to both lower quadrants of the abdomen that started 3 days ago.  It became more constant yesterday and prevented her from getting quality sleep.  She rates it as a 7-8/10 and is associated with nausea.  She denies fever, vomiting, or urinary symptoms.  Past Medical History:  Diagnosis Date   Anxiety    AV node dysfunction    tachycardia sees Dr. Bosie   Cervical disc disease    degenerative disk    Chronic abdominal pain    Heart murmur    Hyperlipidemia    Hypertension    IBS (irritable bowel syndrome)    Mitral valve prolapse    Palpitations    Sleep apnea    possible but not tested    Patient Active Problem List   Diagnosis Date Noted   Bilateral chronic knee pain 07/18/2023   Internal derangement of multiple sites of right knee 06/02/2023   Osteoarthritis of left patellofemoral joint 03/28/2023   Tinea corporis 04/10/2021   Cervical spondylosis 11/20/2020   Segmental and somatic dysfunction of cervical region 11/20/2020   Obstructive sleep apnea 12/25/2019   Intractable migraine with aura without status migrainosus 06/21/2016   Chest pain 06/19/2016   Abnormal EKG 06/19/2016   Menopause 01/28/2016   Heart palpitations 10/09/2015   SOB (shortness of breath) 09/24/2015   Diverticulitis 06/04/2015   Lower back pain 06/04/2015   SVT (supraventricular tachycardia) 06/04/2015   Essential hypertension 05/15/2015   Hyperlipidemia 05/15/2015    Gastritis    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   Anxiety disorder due to known physiological condition 07/16/2014   DDD (degenerative disc disease), lumbosacral 07/16/2014   Heart murmur 07/16/2014   Gastroesophageal reflux disease 07/16/2014   H/O: osteoarthritis 07/16/2014    Past Surgical History:  Procedure Laterality Date   APPENDECTOMY  02/22/2006   BREAST BIOPSY Left 09/22/2021   u/s bx, mass 1:00, RIBBON clip-path pending   BREAST CYST ASPIRATION     CARDIAC CATHETERIZATION N/A 09/12/2015   Procedure: Left Heart Cath and Coronary Angiography;  Surgeon: Marsa Dooms, MD;  Location: [facility];  Service: Cardiovascular;  Laterality: N/A;   ESOPHAGOGASTRODUODENOSCOPY (EGD) WITH PROPOFOL  N/A 04/18/2015   Procedure: ESOPHAGOGASTRODUODENOSCOPY (EGD) WITH PROPOFOL ;  Surgeon: Rogelia Copping, MD;  Location: [facility];  Service: Endoscopy;  Laterality: N/A;   HERNIA REPAIR      OB History   No obstetric history on file.      Home Medications    Prior to Admission medications  Medication Sig Start Date End Date Taking? Authorizing Provider  ALPRAZolam  (XANAX ) 0.5 MG tablet TAKE ONE TABLET BY MOUTH ONCE DAILY AS NEEDED 11/15/22   Joshua Cathryne BROCKS, MD  amLODipine  (NORVASC ) 10 MG tablet Take 1 tablet (10 mg total) by mouth daily. 11/15/22   Joshua Cathryne BROCKS, MD  apixaban  (ELIQUIS ) 5 MG TABS tablet Take 1 tablet (5 mg total) by mouth 2 (two) times daily. 11/30/23 01/29/24  Cyrena Mylar,  MD  B Complex-C (SUPER B COMPLEX PO) Take 1 tablet by mouth daily.    [provider]  BIOTIN PO Take 1 capsule by mouth daily.    [provider]  cyclobenzaprine  (FLEXERIL ) 5 MG tablet Take 1-2 tablets (5-10 mg total) by mouth at bedtime as needed for muscle spasms. 07/08/23   Matthews, Jason J, MD  IBUPROFEN  PO Take by mouth.    [provider]  Magnesium 250 MG TABS Take 1 tablet by mouth daily.    [provider]   meloxicam  (MOBIC ) 15 MG tablet Take 1 tablet (15 mg total) by mouth daily. X 2 weeks then daily PRN Patient not taking: Reported on 07/08/2023 03/22/23   Alvia Selinda PARAS, MD  methylPREDNISolone  (MEDROL  DOSEPAK) 4 MG TBPK tablet Use as directed. 07/08/23   Matthews, Jason J, MD  metoprolol  tartrate (LOPRESSOR ) 25 MG tablet Take 1/2 (one-half) tablet by mouth twice daily 11/15/22   Jones, Deanna C, MD  omeprazole  (PRILOSEC) 40 MG capsule TAKE 1 CAPSULE BY MOUTH TWICE DAILY. 11/15/22   Joshua Cathryne BROCKS, MD  ondansetron  (ZOFRAN -ODT) 4 MG disintegrating tablet Take 1 tablet (4 mg total) by mouth every 8 (eight) hours as needed. Patient not taking: Reported on 07/08/2023 04/17/23   Brimage, Vondra, DO  simvastatin  (ZOCOR ) 40 MG tablet Take 1 tablet (40 mg total) by mouth daily. 11/15/22   Joshua Cathryne BROCKS, MD    Family History Family History  Problem Relation Age of Onset   Atrial fibrillation Mother    Hypertension Mother    Diabetes Mother    Atrial fibrillation Father    Breast cancer Maternal Aunt        mat great aunt    Social History Social History[1]   Allergies   Ciprofloxacin , Oxycodone-acetaminophen , Oxycodone-acetaminophen , Sertraline, Vicodin [hydrocodone-acetaminophen ], Dilaudid [hydromorphone hcl], Fenofibrate, and Prednisone    Review of Systems Review of Systems  Constitutional:  Negative for fever.  Gastrointestinal:  Positive for abdominal pain and nausea. Negative for constipation, diarrhea and vomiting.  Genitourinary:  Negative for dysuria, frequency, hematuria and urgency.  Musculoskeletal:  Positive for back pain.     Physical Exam Triage Vital Signs ED Triage Vitals  Encounter Vitals Group     BP      Girls Systolic BP Percentile      Girls Diastolic BP Percentile      Boys Systolic BP Percentile      Boys Diastolic BP Percentile      Pulse      Resp      Temp      Temp src      SpO2      Weight      Height      Head Circumference      Peak Flow       Pain Score      Pain Loc      Pain Education      Exclude from Growth Chart    No data found.  Updated Vital Signs BP 128/81 (BP Location: Right Arm)   Pulse 78   Temp 98.7 F (37.1 C) (Oral)   Resp 18   Wt 206 lb (93.4 kg)   LMP 04/18/2017   SpO2 98%   BMI 28.73 kg/m   Visual Acuity Right Eye Distance:   Left Eye Distance:   Bilateral Distance:    Right Eye Near:   Left Eye Near:    Bilateral Near:     Physical Exam Vitals and  nursing note reviewed.  Constitutional:      Appearance: Normal appearance. She is not ill-appearing.  HENT:     Head: Normocephalic and atraumatic.  Cardiovascular:     Rate and Rhythm: Normal rate and regular rhythm.     Pulses: Normal pulses.     Heart sounds: Normal heart sounds. No murmur heard.    No friction rub. No gallop.  Pulmonary:     Effort: Pulmonary effort is normal.     Breath sounds: Normal breath sounds. No wheezing, rhonchi or rales.  Abdominal:     General: Abdomen is flat.     Palpations: Abdomen is soft.     Tenderness: There is no abdominal tenderness. There is no right CVA tenderness, left CVA tenderness, guarding or rebound.  Musculoskeletal:        General: Tenderness present.  Skin:    General: Skin is warm and dry.     Capillary Refill: Capillary refill takes less than 2 seconds.  Neurological:     General: No focal deficit present.     Mental Status: She is alert and oriented to person, place, and time.      UC Treatments / Results  Labs (all labs ordered are listed, but only abnormal results are displayed) Labs Reviewed  POCT URINE DIPSTICK - Normal    EKG   Radiology No results found.  Procedures Procedures (including critical care time)  Medications Ordered in UC Medications - No data to display  Initial Impression / Assessment and Plan / UC Course  I have reviewed the triage vital signs and the nursing notes.  Pertinent labs & imaging results that were available during my care of  the patient were reviewed by me and considered in my medical decision making (see chart for details).   Patient is a pleasant 58 year old female who is currently 7 weeks postop left total knee replacement coming in for evaluation of lower thoracic back pain that started 3 days ago.  She reports that it waxed and waned for couple days and then intensified last night.  The pain is described as a constant nagging to sharp pain that is 7-8/10.  The pain did interfere with the patient sleep last night.  There has been some nausea but no associated vomiting and no fever.  She reports the pain will occasionally radiate down to both lower quadrants of the abdomen but is not present currently.  Her abdomen is soft and nontender.  She has no CVA tenderness on exam.  She does have tenderness with palpation of the lower thoracic paraspinous muscle groups but no overt spasm.  Movement does tend to intensify the patient's pain.  She reports that she is moving her bowels normally though she has had some yellow bilious looking diarrhea but she does report having 3 bowel movements yesterday.  The patient reports that she had an emergency appendectomy in 2009.  Epic shows that the surgery was in January 2008.  However, CT scan dated 04/05/2022 reports colonic diverticulosis without evidence of diverticulitis and a noninflamed appendix.  Also periampullary duodenal diverticulum.  Previous CT imaging from 2015 indicated a kidney stone in the upper pole of the right kidney though there is no mention of the kidney stone on CT imaging from December of last year.  She does have a Richter type infraumbilical ventral hernia which contains a portion of a loop of nonobstructed small bowel.  Differential diagnosis include renal calculi, UTI, constipation, musculoskeletal back pain.  I will obtain  a radiograph of the patient's abdomen to evaluate for any acute intra-abdominal pathology as well as a UA to assess for the presence of infection or  stone.  UA is completely unremarkable.  KUB independently reviewed and evaluated by me.  Impression: Stool throughout the colon especially in the descending colon and rectal vault.  Nonobstructive bowel gas pattern.  No radiopaque calculi visualized.  Radiology overread is pending. Radiology impression states nonobstructive bowel gas pattern.  I will discharge patient home with diagnosis of constipation.  I will have her use over-the-counter MiraLAX as well as 2 Dulcolax tablets at the onset to help resolve her constipation.  Return and ER precautions reviewed.   Final Clinical Impressions(s) / UC Diagnoses   Final diagnoses:  Abdominal pain, unspecified abdominal location  Constipation, unspecified constipation type     Discharge Instructions      Increase your oral water  intake so that you are remaining well-hydrated and there is increased water  to help soften your bowel.  Take over-the-counter MiraLAX, 1 capful (17 g) in 8 ounces of a beverage of your choice daily until you are having normal bowel movements.  You can also take 2 over-the-counter Dulcolax tablets along with your first dose of MiraLAX to help move your stools along.  Increase your oral dietary fiber intake so as to add bulk to your stool and help decrease incidence of constipation.  If you have any increasing abdominal pain, blood in your stool, abdominal bloating, or fever please follow-up in the emergency department for evaluation.      ED Prescriptions   None    PDMP not reviewed this encounter.    Bernardino Ditch, NP 02/08/24 1027     [1]  Social History Tobacco Use   Smoking status: Never   Smokeless tobacco: Never  Vaping Use   Vaping status: Never Used  Substance Use Topics   Alcohol use: Not Currently   Drug use: Never     Bernardino Ditch, NP 02/08/24 1048

## 2024-02-08 NOTE — ED Triage Notes (Signed)
 Pt c/o lower back and lower abdominal pain x 3 days. Pt has taken OTC pain medication for her symptoms. Pt denies any urinary symptoms.
